# Patient Record
Sex: Female | Born: 1945
Health system: Southern US, Community
[De-identification: ages and names within clinical notes are randomized; demographics above are authoritative.]

## PROBLEM LIST (undated history)

## (undated) DIAGNOSIS — M629 Disorder of muscle, unspecified: Secondary | ICD-10-CM

## (undated) DIAGNOSIS — K270 Acute peptic ulcer, site unspecified, with hemorrhage: Secondary | ICD-10-CM

## (undated) DIAGNOSIS — M159 Polyosteoarthritis, unspecified: Secondary | ICD-10-CM

## (undated) DIAGNOSIS — I671 Cerebral aneurysm, nonruptured: Secondary | ICD-10-CM

## (undated) DIAGNOSIS — E785 Hyperlipidemia, unspecified: Secondary | ICD-10-CM

## (undated) DIAGNOSIS — I10 Essential (primary) hypertension: Secondary | ICD-10-CM

## (undated) DIAGNOSIS — E079 Disorder of thyroid, unspecified: Secondary | ICD-10-CM

## (undated) HISTORY — PX: ABDOMINAL HYSTERECTOMY: SHX81

## (undated) HISTORY — DX: Essential (primary) hypertension: I10

## (undated) HISTORY — PX: FOOT SURGERY: SHX648

## (undated) HISTORY — DX: Hyperlipidemia, unspecified: E78.5

## (undated) HISTORY — DX: Disorder of thyroid, unspecified: E07.9

## (undated) HISTORY — PX: KIDNEY STONE SURGERY: SHX686

## (undated) HISTORY — DX: Cerebral aneurysm, nonruptured: I67.1

## (undated) HISTORY — PX: BRAIN SURGERY: SHX531

## (undated) HISTORY — DX: Polyosteoarthritis, unspecified: M15.9

## (undated) HISTORY — PX: REFERRAL TO SPORTS AND SPINE: 9506

## (undated) HISTORY — DX: Acute peptic ulcer, site unspecified, with hemorrhage: K27.0

## (undated) HISTORY — PX: PR UNLISTED PROCEDURE NECK/THORAX: 21899

## (undated) HISTORY — DX: Disorder of muscle, unspecified: M62.9

## (undated) HISTORY — PX: PR UNLISTED PROCEDURE SPINE: 22899

---

## 2000-03-04 ENCOUNTER — Encounter: Payer: Self-pay | Admitting: Surgery

## 2001-06-02 ENCOUNTER — Encounter: Payer: Self-pay | Admitting: Urology

## 2001-07-07 ENCOUNTER — Encounter: Payer: No Typology Code available for payment source | Admitting: Surgery

## 2001-07-14 ENCOUNTER — Encounter: Payer: Self-pay | Admitting: Urology

## 2001-09-21 ENCOUNTER — Other Ambulatory Visit (HOSPITAL_BASED_OUTPATIENT_CLINIC_OR_DEPARTMENT_OTHER): Payer: Self-pay | Admitting: Surgery

## 2001-09-24 LAB — PATHOLOGY, SURGICAL

## 2001-09-29 ENCOUNTER — Encounter: Payer: No Typology Code available for payment source | Admitting: Surgery

## 2002-09-03 ENCOUNTER — Encounter: Payer: No Typology Code available for payment source | Admitting: Neurological Surgery

## 2002-10-01 ENCOUNTER — Encounter: Payer: No Typology Code available for payment source | Admitting: Physical Medicine & Rehabilitation

## 2002-10-05 ENCOUNTER — Encounter: Payer: No Typology Code available for payment source | Admitting: Orthopaedic Surgery

## 2002-10-12 ENCOUNTER — Encounter: Payer: No Typology Code available for payment source | Admitting: Ophthalmology

## 2003-01-11 ENCOUNTER — Encounter: Payer: No Typology Code available for payment source | Admitting: Orthopaedic Surgery

## 2003-01-13 ENCOUNTER — Encounter: Payer: No Typology Code available for payment source | Admitting: Internal Medicine

## 2003-01-19 ENCOUNTER — Other Ambulatory Visit: Payer: No Typology Code available for payment source

## 2003-01-26 ENCOUNTER — Encounter (HOSPITAL_COMMUNITY): Payer: No Typology Code available for payment source

## 2003-01-28 ENCOUNTER — Ambulatory Visit: Payer: No Typology Code available for payment source

## 2003-04-19 ENCOUNTER — Encounter: Payer: No Typology Code available for payment source | Admitting: Orthopaedic Surgery

## 2004-02-28 ENCOUNTER — Encounter: Payer: No Typology Code available for payment source | Admitting: Orthopaedic Surgery

## 2007-06-18 ENCOUNTER — Ambulatory Visit: Payer: Self-pay | Admitting: Gastroenterology

## 2007-06-18 ENCOUNTER — Ambulatory Visit (HOSPITAL_COMMUNITY): Admission: RE | Admit: 2007-06-18 | Discharge: 2007-06-18 | Payer: Self-pay | Admitting: Gastroenterology

## 2009-11-07 ENCOUNTER — Ambulatory Visit: Payer: No Typology Code available for payment source | Attending: Orthopaedic Surgery | Admitting: Orthopaedic Surgery

## 2009-11-07 VITALS — BP 130/80 | Ht 62.0 in | Wt 168.6 lb

## 2009-11-07 DIAGNOSIS — M545 Low back pain, unspecified: Secondary | ICD-10-CM | POA: Insufficient documentation

## 2009-11-07 DIAGNOSIS — M542 Cervicalgia: Secondary | ICD-10-CM | POA: Insufficient documentation

## 2009-11-07 DIAGNOSIS — Z981 Arthrodesis status: Secondary | ICD-10-CM | POA: Insufficient documentation

## 2009-11-07 LAB — PR RADEX SPINE LUMBOSACRAL 2/3 VIEWS

## 2009-11-07 LAB — PR RADEX SPINE CERVICAL 2 OR 3 VIEWS

## 2009-11-13 ENCOUNTER — Encounter (HOSPITAL_BASED_OUTPATIENT_CLINIC_OR_DEPARTMENT_OTHER): Payer: Self-pay | Admitting: Physical Medicine & Rehabilitation

## 2009-11-13 ENCOUNTER — Other Ambulatory Visit (HOSPITAL_BASED_OUTPATIENT_CLINIC_OR_DEPARTMENT_OTHER): Payer: Self-pay | Admitting: Physical Medicine & Rehabilitation

## 2009-11-13 NOTE — Progress Notes (Signed)
Referral from Dr Sibyl Parr for [facility].   MRI L spine report 10/16/2009 reviewed.   Order placed for ESI.   Pt needs to hand-carry MRI images to injection appt.   Will ask RN to contact pt for pre-injection teaching.

## 2009-11-14 NOTE — Progress Notes (Signed)
Danielle Small, Danielle Small [id number]  Orthopedics - Outpt Record Transcribed  Service Date: Nov-08-2009  Dictated by Stratton Villwock, MD, Ayson Cherubini J on Nov-08-2009  [id number]    ORTHOPEDICS RETURN CLINIC DICTATION NOTE   HISTORY OF PRESENT ILLNESS   The patient is a 63-year-old female, who is all known to our clinic, although we have not seen her in a few years. She in short had a L4-S1 fusion. She actually had 4 previous lumbar surgeries, none that we performed at the Orthopedic department; however, most recent one was done by Dr Shaffrey, who did a revision L4-L5 and S1 fusion. He did place anterior grafts well. She has done extremely well from this and has really had no problems until about 6 months ago. She had a rotator cuff procedure and after that, she felt like being sedentary in every thing and elicited some significant symptoms in her bilateral buttock and anterolateral legs down to the level of her knee but not below. She denies any incontinence or frank weakness, but she is having this thing that is rather consistent and bothersome to her.   Of note, she did have 6 corpectomy and C5-C7 ACDF with SynMesh cage placed by Dr Chapman about 6 years ago. She has done very well from this. She has had no complaints and her symptoms have resolved. It appears that the majority of her symptoms preoperative from that were symptoms of radiculopathy more so than myelopathy. She does claim upon being asked that she may notice a little bit more instability on her feet, although this is quite subtle and has not resulted in any falls. She denies any clumsiness in her hands or weakness of her upper extremities.   She does come in today with an MRI of her lumbar spine, which is performed about 3 weeks ago.   PHYSICAL EXAMINATION   NEUROLOGIC: Bilateral upper extremities demonstrate 5/5 motor. She does have positive Hoffman bilaterally. She has 2+ biceps, triceps, reflexes 1-1+ in brachioradialis. Bilateral lower extremities demonstrate 5/5 motor.  Her right knee extension is somewhat limited by her knee osteoarthritic pain and is difficult to get an exam, although this appears to be 5/5. Her tendo Achilles and patellar reflexes are 2+ on bilateral lower extremities and her left lower extremity has 1 beat of clonus. On ambulatory exam, she is stable, has a normal gait. She is able to heel walk, toe walk. She has slight difficulty with tandem and heel-to-toe walking, but is able to do it when she walks slowly.   IMAGING   Lumbar films were performed today, which showed the hardware to be in good position and good consolidation across her L4-L5 interspace and L5-S1 interspace. She does show interval breakdown of the L3-L4 level since her previous x-rays, which were about 3 years ago. She has slight anterolisthesis of L3 on L4 and decreased disk height as compared to her last x-ray. Her cervical x-rays were reviewed as well, which showed the previous mesh cage in place. In the interim since her last x-rays approximately 3 years ago, it appears that she has broken one of her upper screws and the plate may have shifted anteriorly very minimally. This does not appear to be significantly displaced. She has shown some adjacent level of degeneration at the level above of her previous fusion and slight increase in kyphosis on her flexion films, although there is no gross instability noted.   IMPRESSION   This 63-year-old female with adjacent level breakdown of L3-L4 above her L4-S1 fusion with bilateral   lower extremity symptoms in her buttocks and lateral thighs.   PLAN   1.   She would like to hold off on any thing surgical at this point. She is very active and is currently swimming and exercising quite a bit. She was interested in trying an injection. We will get her set up for a steroid injection.    2.   As far as her cervical spine is concerned, we are going to keep an eye on this. I do not think she is frankly myelopathic at this point. We are going to keep a  close eye on it and she has been given any warning signs to make us aware of, which would be consistent with myelopathy. We will get return x-rays of her cervical spine AP, lateral, flexion and extension films, and the same for her lumbar spine when she returns in 6 months. In the interim, she will have lumbar epidural steroid injection.          Signature Line   _______________________________________  Almeta Geisel, MD, Analycia Khokhar J  Attending, Department of Orthopaedics  [id number]  [place], [place]                cc: Franceschina, DO, Michael J  [address]  [place], [place] [id number]    Adatia, DO, Alnasir H  [address] [place] [address]        JJM/TUO  DD:11/07/09  TD:11/14/09      [id number]    CC Address Information  Franceschina, DO, Michael J  [address]  [place], [place] [id number]

## 2009-11-14 NOTE — Progress Notes (Signed)
I concur with note of Dr. Lady Saucier as listed below. I have personally examined the patient and discussed findings and treatment options with her and her husband.

## 2009-11-17 ENCOUNTER — Telehealth (HOSPITAL_BASED_OUTPATIENT_CLINIC_OR_DEPARTMENT_OTHER): Payer: Self-pay

## 2009-11-20 NOTE — Telephone Encounter (Signed)
Date of Call: 11/17/09    Reason for Call:  Pre-injection instructions.  Pre-injection instructions as follows:  Procedure to be performed:  Left L3 TFESI  Date of Procedure:  11/21/09  Provider Performing Procedure: Carlos American  Location of Procedure: [facility]  **Must have ride home after procedure: instructed (rationale explained to patient)  MRI in [facility]: 11/07/09   If MRI performed outside [facility] system, patient to bring MRI to appointment:     Allergy to Contrast Media:  denies  Allergies Reviewed by Provider: yes    IV/Sedation: no  Take medications prescribed by PCP on day of procedure: yes  Light breakfast or lunch before procedure. NPO 1 hr. before procedure: yes  Restart Medications Day After Procedure: yes    Patient Advised To Discontinue:  Coumadin- stop for 5 days pre-injection and 2 days post-injection: NA  Plavix- stop for 7 days:  NA  NSAIDs- stop for 2 days: instructed  ASAs- stop for 5 days: instructed  Low-dose ASA (baby ASA) may be continued: yes    Insulin:   Patient takes metformin but does not do  blood testing.  Instructed patient to test her blood sugar X2 days post injection because of possible effect of steroid on her blood sugars.  Patient states she will do so and indicates understanding of why she needs to do this.  Hypoglycemics: Metformin  Pre-procedure INR needed: NA    Instructed to call office if signs of infection or taking antibiotics: yes  Call [facility] with Additional Laymond Purser [phone]  Day of Procedure- call [facility] @ [phone]    Roselee Culver RN  [facility]

## 2009-11-21 ENCOUNTER — Ambulatory Visit (HOSPITAL_BASED_OUTPATIENT_CLINIC_OR_DEPARTMENT_OTHER): Payer: No Typology Code available for payment source | Admitting: Physical Medicine & Rehabilitation

## 2009-11-28 NOTE — Progress Notes (Addendum)
Danielle Small, Danielle Small [id number]  Orthopedics - Outpt Record Transcribed  Service Date: Nov-08-2009  Dictated by Lady Saucier, MD, Eustace Moore on Nov-08-2009  [id number]    ORTHOPEDICS RETURN CLINIC DICTATION NOTE   HISTORY OF PRESENT ILLNESS   The patient is a 63 year old female, who is all known to our clinic, although we have not seen her in a few years. She in short had a L4-S1 fusion. She actually had 4 previous lumbar surgeries, none that we performed at the Orthopedic department; however, most recent one was done by Dr Corbin Ade, who did a revision L4-L5 and S1 fusion. He did place anterior grafts well. She has done extremely well from this and has really had no problems until about 6 months ago. She had a rotator cuff procedure and after that, she felt like being sedentary in every thing and elicited some significant symptoms in her bilateral buttock and anterolateral legs down to the level of her knee but not below. She denies any incontinence or frank weakness, but she is having this thing that is rather consistent and bothersome to her.   Of note, she did have 6 corpectomy and C5-C7 ACDF with SynMesh cage placed by Dr Sibyl Parr about 6 years ago. She has done very well from this. She has had no complaints and her symptoms have resolved. It appears that the majority of her symptoms preoperative from that were symptoms of radiculopathy more so than myelopathy. She does claim upon being asked that she may notice a little bit more instability on her feet, although this is quite subtle and has not resulted in any falls. She denies any clumsiness in her hands or weakness of her upper extremities.   She does come in today with an MRI of her lumbar spine, which is performed about 3 weeks ago.   PHYSICAL EXAMINATION   NEUROLOGIC: Bilateral upper extremities demonstrate 5/5 motor. She does have positive Hoffman bilaterally. She has 2+ biceps, triceps, reflexes 1-1+ in brachioradialis. Bilateral lower extremities demonstrate 5/5 motor.  Her right knee extension is somewhat limited by her knee osteoarthritic pain and is difficult to get an exam, although this appears to be 5/5. Her tendo Achilles and patellar reflexes are 2+ on bilateral lower extremities and her left lower extremity has 1 beat of clonus. On ambulatory exam, she is stable, has a normal gait. She is able to heel walk, toe walk. She has slight difficulty with tandem and heel-to-toe walking, but is able to do it when she walks slowly.   IMAGING   Lumbar films were performed today, which showed the hardware to be in good position and good consolidation across her L4-L5 interspace and L5-S1 interspace. She does show interval breakdown of the L3-L4 level since her previous x-rays, which were about 3 years ago. She has slight anterolisthesis of L3 on L4 and decreased disk height as compared to her last x-ray. Her cervical x-rays were reviewed as well, which showed the previous mesh cage in place. In the interim since her last x-rays approximately 3 years ago, it appears that she has broken one of her upper screws and the plate may have shifted anteriorly very minimally. This does not appear to be significantly displaced. She has shown some adjacent level of degeneration at the level above of her previous fusion and slight increase in kyphosis on her flexion films, although there is no gross instability noted.   IMPRESSION   This 63 year old female with adjacent level breakdown of L3-L4 above her L4-S1 fusion with bilateral  lower extremity symptoms in her buttocks and lateral thighs.   PLAN   1.   She would like to hold off on any thing surgical at this point. She is very active and is currently swimming and exercising quite a bit. She was interested in trying an injection. We will get her set up for a steroid injection.    2.   As far as her cervical spine is concerned, we are going to keep an eye on this. I do not think she is frankly myelopathic at this point. We are going to keep a  close eye on it and she has been given any warning signs to make [place] aware of, which would be consistent with myelopathy. We will get return x-rays of her cervical spine AP, lateral, flexion and extension films, and the same for her lumbar spine when she returns in 6 months. In the interim, she will have lumbar epidural steroid injection.          Signature Line   _______________________________________  Lady Saucier, MD, Eustace Moore  Attending, Department of Orthopaedics  [phone]  [place], [place]                cc: Ileene Musa  [address]  [place], [place] [id number]    Anthony Sar, Marney Setting  [address] [address] [place], [place] [address]        JJM/TUO  DD:11/07/09  TD:11/14/09      [id number]    CC Address Information  Ileene Musa  [address] [address]  [place], [place] [id number]

## 2009-12-11 ENCOUNTER — Telehealth (HOSPITAL_BASED_OUTPATIENT_CLINIC_OR_DEPARTMENT_OTHER): Payer: Self-pay | Admitting: Physical Medicine & Rehabilitation

## 2009-12-11 NOTE — Telephone Encounter (Signed)
VL 12.13:  The SSP RN is out sick today, so in preparation for the RN Float, I double-checked the patient's INJ paperwork including INJ order, patient allergies, and check-in / INJ times.  Please note:  Patient is allergic to penicillin V.  Will have the RN Float follow-up with INJ patients later today for any pre- and post-INJ protocls.  Thanks.    Jill Side, POS  [facility]

## 2009-12-11 NOTE — Telephone Encounter (Signed)
Spoke with Danielle Small by phone this afternoon, verbally gave patient pre and post op instructions for left  L3TFESI procedure tomorrow at 1000.   Instructed to take am chem stick and bring result to procedure, per Dr Julianne Rice) will take usual dose of Metformin, have light breakfast,NPO after 0900,check in at [facility] at 0930.  Confirmed only allergy is Penicillin.  Confirmed she will bring recent MRI  Daughter will provide ride home.  For urgent and emergent issues after clinic hours, after procedure to contact MD on call for Dr Bayard Beaver at [phone].  She stated has had this procedure before.  All questions answered.  Austyn verbalized understanding and comfortable with plan.  [employer] inspector

## 2009-12-12 ENCOUNTER — Ambulatory Visit (HOSPITAL_BASED_OUTPATIENT_CLINIC_OR_DEPARTMENT_OTHER): Payer: No Typology Code available for payment source | Admitting: Physical Medicine & Rehabilitation

## 2009-12-12 ENCOUNTER — Ambulatory Visit
Payer: No Typology Code available for payment source | Attending: Physical Medicine & Rehabilitation | Admitting: Physical Medicine & Rehabilitation

## 2009-12-12 DIAGNOSIS — M5126 Other intervertebral disc displacement, lumbar region: Secondary | ICD-10-CM | POA: Insufficient documentation

## 2009-12-12 DIAGNOSIS — IMO0002 Reserved for concepts with insufficient information to code with codable children: Secondary | ICD-10-CM | POA: Insufficient documentation

## 2009-12-12 DIAGNOSIS — M47817 Spondylosis without myelopathy or radiculopathy, lumbosacral region: Secondary | ICD-10-CM | POA: Insufficient documentation

## 2009-12-12 NOTE — Progress Notes (Addendum)
[address]  l  [id number]  l  [place], [place]  [address]  TEL: [phone]  l  FAX: [phone]    Specializing in sports-related injuries and non-surgical care for conditions of the spine,  shoulder, elbow, wrist, hand, hip, knee, foot, and ankle.      12/12/2009    Orrin Brigham Alverda Skeans  [id number]      PROCEDURE:  Fluoroscopic guided L L3 transforaminal epidural steroid injection    DIAGNOSIS/INDICATION: L>R radicular pain in anterior thigh.  MRI from 10/16/2009 reviewed. S/p L4-S1 fusion, HNP, central and L lateral recess stenosis L3-4. Referred by Dr Sibyl Parr for [facility]. Please see clinic notes for additional information.  Allergies: is allergic to penicillin v.  Allergies were reviewed with the patient. No contraindications were identified.      CONSENT:    Potential risks including pain, serious infection, paralysis, nerve injury, spinal headache, allergic reaction, and others were discussed with the patient. The patient was given and read a patient information sheet regarding injection procedures prior to today's injection. The patient understands the benefits, risks, and alternatives of the procedure and agrees to the procedure today.  Written informed consent was obtained.       PROCEDURE TECHNIQUE:    The patient was escorted to the procedure suite and positioned prone on the procedure table. A brief pause occurred prior to procedure in which final verification was performed. The skin overlying the area was prepped and draped in the usual sterile fashion using Betadine.  Under fluoroscopic guidance the left L3 neuroforamen was located. Using a 25-gauge needle, the skin overlying the area was anesthetized with 1% lidocaine. Using intermittent fluoroscopic guidance, a 22 gauge 3  inch spinal needle was advanced to the region of the target nerve root.  A small amount of Omnipaque (iohexol) contrast was instilled through extension tubing to identify the nerve root sleeve and assure no  vascular tracking using live fluoroscopy and digital subtraction angiography. This was followed by the injection of 1.5 cc 1.0% lidocaine and 2 cc of Kenalog. The needle was then flushed with 1% lidocaine and removed, the skin was cleansed, and a bandage placed over the injection site.  The patient was assisted to the seated position and then escorted to the post procedure recovery area in good condition.  The patient tolerated the procedure well.  Vital signs remained stable both pre- and post- procedure.   Complications: none    Pre-inj 7/10  Post-inj 2/10    POST-INJECTION PLAN  1.  The patient was given instructions regarding the use of ice and appropriate activity level over the next few weeks.  Patient was instructed to call my office immediately if there are any questions or problems.    2.  The patient will follow up with Dr Sibyl Parr in 2-3 weeks.

## 2009-12-15 ENCOUNTER — Telehealth (HOSPITAL_BASED_OUTPATIENT_CLINIC_OR_DEPARTMENT_OTHER): Payer: Self-pay

## 2009-12-15 NOTE — Telephone Encounter (Signed)
Follow-up phone call regarding Left L3 joint steroid injection done 12/12/09 by Dr Carlos American. Pt states that she is "doing very well thank you and on the whole feeling terrific". She denies any pain, redness at injection site. She denied any new symptoms such as numbness, tingling, weakness, or changes in bowel or bladder function. Follow-up was scheduled for 12/19/09 with Dr Sibyl Parr and she will call the clinic in the meantime with any questions or concerns.

## 2009-12-19 ENCOUNTER — Encounter (HOSPITAL_BASED_OUTPATIENT_CLINIC_OR_DEPARTMENT_OTHER): Payer: Self-pay | Admitting: Orthopaedic Surgery

## 2009-12-19 ENCOUNTER — Ambulatory Visit: Payer: No Typology Code available for payment source | Attending: Orthopaedic Surgery | Admitting: Orthopaedic Surgery

## 2009-12-19 DIAGNOSIS — IMO0002 Reserved for concepts with insufficient information to code with codable children: Secondary | ICD-10-CM | POA: Insufficient documentation

## 2009-12-19 DIAGNOSIS — M4310 Spondylolisthesis, site unspecified: Secondary | ICD-10-CM | POA: Insufficient documentation

## 2009-12-19 NOTE — Progress Notes (Signed)
HPI: Danielle Small is a 63 year old female who was last seen 11/07/09 w/ complaint of significant bilateral posterior buttock and anterolateral leg pain to the knee level without frank weakness or incontinence after a rotator cuff surgery and being sedentary postoperatively ~6 months ago.      She is s/p L4-S1 fusion w/ 4 previous lumbar surgeries, none that we performed at the [facility] Orthopedic department; however, most recent one was done by Dr Corbin Ade, who did a revision L4-L5 and S1 fusion. He did place anterior grafts well. She has done extremely well from this and has really had no problems until ~ 6 months ago as described above.    On her last visit she was found to have well healed fusion sites with hardware in place.  However, there was noted interval breakdown of the L3-4 level above the fusion site with decreased disk height and grade 1 anterolisthesis of L3-L4.  She was referred to Dr. Bayard Beaver of the [facility] sports and spine service for Left L3 TFESI.     The patient returns for follow up after epidural steroid injection done 12/12/09. She reportsher symptoms are absent .  She reports that she had immediate relief of her anterolateral thigh symptoms that have not returned.  She has continued to be active with Swimming, walkinga dn extended shopping without return of her symptoms.          Social History   Occupational History   . Not on file.   Social History Main Topics   . Tobacco Use: Never   . Alcohol Use: Not on file   . Drug Use: Not on file   . Sexually Active: Not on file         Allergies: Penicillin v    She has a current medication list which includes butalbital-asa-caff-codeine.    ROS: unchanged.  She specifically denies fever, chills, n/v, heart condition, breathing problems, or diabetes.    Physical exam:    GENERAL:  Danielle Small is a 63 year old obese female in no apparent distress.    PSYCHIATRIC: The patient is alert and oriented. Calm, pleasant  affect.    PULMONARY: The patient has Nonlabored breathing    Spine Exam:     Exam is unchanged with Bilateral lower extremities 5/5 to all major motor groups. Continues to have rifht knee limited extension d/t osteoarthritis.DTR Patellar and achilles are 2+ bilaterally.  She has a stable gait that is nonantalgic.  She is able to heal and toes walk without difficulty.      Imaging:  No imaging done today.      Assessment:    Danielle Small Danielle Small is a 63 year old female  with the clinical presentation of complete relief of left sided L3 radicular symptoms 1 week following L L3 TFESI.  - Encouraged that the patient has gotten such a good result.   - Advised to continue low impact aerobic activity with strong core and limber and strong hamstrings, quads and gluteals.  Pt. Has been instructed in flamingo stance adn hamsteing stretches adn actually is fairly naturally limber in hamstrings.  She is advised to do these stretches 4 times daily for 30 seconds each.  Seh is also advised to continue swimming 3-4 times weekly and work on trunk weight loss.  - No surgical indication at this time.   Would avoid extending fusion to L3-4 level unless worsening of degenerative changes.  - Advised may  have up to 3 ESI yearly.  -F/u with Dr. Sibyl Parr prn.    I have spent at least 15 minutes with the patient of which at least half the time was dedicated to counseling and discussion of treatment options.

## 2010-03-09 ENCOUNTER — Telehealth (HOSPITAL_BASED_OUTPATIENT_CLINIC_OR_DEPARTMENT_OTHER): Payer: Self-pay

## 2010-03-09 NOTE — Telephone Encounter (Signed)
Message copied by Andrey Cota on Fri Mar 09, 2010 9:23 AM  ------   Message from: Derrill Memo   Created: Fri Mar 09, 2010 8:27 AM   Contact: chapman     Pt is having extreme pain lower back with shooting pain down left leg. Pain level 8+ Please give her a call. [phone]

## 2010-03-09 NOTE — Telephone Encounter (Signed)
Returned Pts call, no answer left VM to return my call.

## 2010-03-13 NOTE — Telephone Encounter (Signed)
Pt is 64 yo female last seen 12/19/09 s/p L4-S1 fusion w/ 4 previous lumbar surgeries, none that we performed at the [facility] Orthopedic department; however, most recent one was done by Dr Corbin Ade, who did a revision L4-L5 and S1 fusion.   On her last visit 11/07/09 she was found to have well healed fusion sites with hardware in place. However, there was noted interval breakdown of the L3-4 level above the fusion site with decreased disk height and grade 1 anterolisthesis of L3-L4. She was referred to Dr. Bayard Beaver of the [facility] sports and spine service for Left L3 TFESI done 12/12/09.  Pt has been appointed with Dwaine Deter PA-C 03/27/09.  I called and spoke to pt who reports she got about 2 3/4 months of pain relief after injection, that pt came back about 3 weeks ago the same as before the injection low back and left leg, pt states she is taking about 1-2 Tylenol #3 per day to get through work and occasionally a Herbalist at night with fair relief, but pt would like a repeat injection, pt also asks about referral to Pain Clinic for ongoing pain management as her PCP is becoming reluctant to continue to prescribe.  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

## 2010-03-15 ENCOUNTER — Other Ambulatory Visit (HOSPITAL_BASED_OUTPATIENT_CLINIC_OR_DEPARTMENT_OTHER): Payer: Self-pay | Admitting: Physical Medicine & Rehabilitation

## 2010-03-15 NOTE — Progress Notes (Signed)
Referred by Dr Sibyl Parr for [facility]. Good relief with prior L L3 TFESI.   MRI reviewed.   Order completed.   Will ask RN to contact pt for pre-procedure teaching.   Pt has appt with Dwaine Deter, Ortho-Spine 3/29, and per notes would like to have injection in April.   Pt to f/u with Dr Esperanza Sheets team 2-3 weeks after injection.

## 2010-03-16 NOTE — Telephone Encounter (Signed)
I called and left message letting pt know that referral to Sports and Spine for repeat ESI has been entered, also that referral to Pain Clinic should be discussed at visit with Dwaine Deter PA-C.

## 2010-03-19 ENCOUNTER — Telehealth (HOSPITAL_BASED_OUTPATIENT_CLINIC_OR_DEPARTMENT_OTHER): Payer: Self-pay | Admitting: Physician Assistant

## 2010-03-19 NOTE — Telephone Encounter (Signed)
Pt. To see Dr. Carlos American for [facility] and f/u w/ PA 2 weeks later.  Discussed w/ Pt. Her concerns that PCP is becoming more reluctant to prescribe narcotic pain medication on an ongoing basis.  At this point we will not be referring to pain clinic, but advise to get injection as scheduled 04/10/10, then f/u in our clinic to evaluate theraputic as well as diagnostic result of the injection and decide treatment path forward at that time.

## 2010-03-27 ENCOUNTER — Encounter (HOSPITAL_BASED_OUTPATIENT_CLINIC_OR_DEPARTMENT_OTHER): Payer: No Typology Code available for payment source | Admitting: Physician Assistant

## 2010-04-09 ENCOUNTER — Telehealth (HOSPITAL_BASED_OUTPATIENT_CLINIC_OR_DEPARTMENT_OTHER): Payer: Self-pay

## 2010-04-09 NOTE — Telephone Encounter (Signed)
SPORTS & SPINE  INJECTION INSTRUCTIONS      PATIENT: Danielle Small    [id number]  Date of Procedure:  04/10/10  Provider performing procedure:  Bayard Beaver, MD  Patient to follow-up with:  Dr. Sibyl Parr  Procedure to be performed (including side):  Left l3 tfesi   Diagnostic Procedure Only:  NO  REQUIRED IMAGING IN PACS:  yes   Patient reminded to bring MRI to appointment if completed outside the [place] system:  no    PRE-INJ GENERAL INSTRUCTIONS:    Patient instructed to check in at the [facility], located NEXT DOOR on the 4th floor at the [facility] ([address] [place], [place], [place] [address]):  yes  Patient told to eat a light breakfast/lunch, but NPO 1 hr prior to procedure:  yes  Patient instructed on ride home:  yes  Patient instructed to call the [facility] @ [phone] and speak to the nurse:   If experincing any cold symptoms such fevers, chills, night sweating or coughing:  yes  If taking any antibiotics or being treated for an infection:  yes  If she has any additional questions BEFORE the INJ: yes  For day of the procedure, patient instructed to call the [facility] @ [phone]:  yes   Patient advised to take all medications as usually taken throughout the day (including blood pressure medications and diabetes medications), with the exception of any blood-thinning medications.  PATIENT ADVISED TO DISCONTINUE ANTI-COAG MEDS (**Please see List Below**):  N/A (Patient not taking medication(s)) of note that would reschedule procedure.    **ANTI-COAG/MED GUIDELINES TO REVIEW WITH PATIENT BEFORE INJECTION PROCEDURES**  AXIAL INJECTIONS:   NSAIDs:  do not need to hold  ASA:   < 300mg  ~ do not need to hold  >/= 300mg  ~ hold for 7days   Clopidrogel (Plavix) ~ hold for 7days  Ticlopidine (Ticlid) ~ hold for 14days  Warfarin (Coumadin) ~ hold for 5days, check rapid INR on that day,    OK to proceed if INR </= 1.5   Aggrenox (ASA and Dipyridamole) ~ hold  7days  Dipyridamole (Persantine) ~ hold 7days  Tirofiban (Aggrastat) ~ hold 2days  Abciximab (ReoPro) ~ hold 2days  Eptifibatide (Integrilin) ~ hold 2days  Pletal and Trental  ~ hold for 2days  Orgaran ~ hold for 5days  Heparin, Lovenox, Innohep, Fragmin, Normiflo ~ hold for 24 hours  Vitamin E (greater than 400 IU daily) ~ hold for 7days  All herbals ~ hold for 7days  INTRA-ARTICULAR HIP/SHOULDER/KNEE, BURSAL/TENDON INJECTIONS:  Per discretion of provider   **OK to restart all medications evening following procedure.    ALL MEDICATION ALLERGIES REVIEWED BY:  Feliz Beam, RN on 04/09/2010    ADDITIONAL INFORMATION (If "YES" to any questions, notify RN or MD immediately):   Contrast / Iodine / Shellfish Allergy?  NO  Pre-Medication Ordered:  NO  IV: NO   Pre-Procedure STAT Rapid INR Needed?  NO  STAT Rapid INR Ordered:  NO   Patient is diabetic?  No  Patient is/may be pregnant?  No    POST-INJ GENERAL INSTRUCTIONS:    PATIENT INSTRUCTED TO ICE THE INJ SITE 10-15 minutes approximately 3-4 times a day on the day of the INJ, and 10-15 minutes 3-4 times on the day following the INJ to assist with local discomfort:  yes     PATIENT INSTRUCTED TO CALL THE [facility] CLINIC immediately if she develops severe pain, redness,  warmth, or swelling around the INJ site, shaking, chills, or fevers greater than 100F.:  yes     PATIENT INSTRUCTED THAT ACTIVITY SHOULD BE LIMITED on the day of the INJ and physical therapy and other exercises should be delayed for three (3) days following the INJ.  However, return to usual daily activities is expected the day after the INJ.  Typically, if the patient is working, she can expect to return to work the day after the INJ:   yes     IF PATIENT NEEDS ADDITIONAL MEDICATION, if required, it should be obtained from her referring physician:  yes     PATIENT INSTRUCTED THAT IF she HAS ANY CONCERNS, INCLUDING BUT NOT LIMITED TO, worsening pain, numbness, leg weakness or significant  bladder/bowel changes, please call our office immediately at [phone] and ask to speak to our Nurse:  yes     PATIENT TO FOLLOW-UP instructed:  yes      All of the patient's questions were answered.    Feliz Beam, RN  [facility]

## 2010-04-10 ENCOUNTER — Ambulatory Visit (HOSPITAL_BASED_OUTPATIENT_CLINIC_OR_DEPARTMENT_OTHER): Payer: No Typology Code available for payment source | Admitting: Physical Medicine & Rehabilitation

## 2010-04-10 ENCOUNTER — Ambulatory Visit
Payer: No Typology Code available for payment source | Attending: Physical Medicine & Rehabilitation | Admitting: Physical Medicine & Rehabilitation

## 2010-04-10 DIAGNOSIS — IMO0002 Reserved for concepts with insufficient information to code with codable children: Secondary | ICD-10-CM | POA: Insufficient documentation

## 2010-04-10 DIAGNOSIS — M47817 Spondylosis without myelopathy or radiculopathy, lumbosacral region: Secondary | ICD-10-CM | POA: Insufficient documentation

## 2010-04-10 NOTE — Progress Notes (Signed)
[facility]       [address]  [address]  [id number]  [place], [place]  [address]  TEL: [phone]  l  FAX: [phone]    Specializing in sports-related injuries and non-surgical care for conditions of the spine,  shoulder, elbow, wrist, hand, hip, knee, foot, and ankle.      04/10/2010    Orrin Brigham Alverda Skeans  [id number]      PROCEDURE:  Fluoroscopic guided left L3 transforaminal epidural steroid injection    DIAGNOSIS/INDICATION: left L3 radicular pain s/p L4-S1 fusion w/ 4 previous lumbar surgeries. Underwent previous L L3 TFESI w/ good relief. Referred by Dr Sibyl Parr for repeat injection.  Please see clinic notes for additional information.  Allergies: is allergic to penicillin v.  Allergies were reviewed with the patient. No contraindications were identified.      CONSENT:    Potential risks including pain, serious infection, paralysis, nerve injury, spinal headache, allergic reaction, and others were discussed with the patient. The patient was given and read a patient information sheet regarding injection procedures prior to today's injection. The patient understands the benefits, risks, and alternatives of the procedure and agrees to the procedure today.  Written informed consent was obtained.       PROCEDURE TECHNIQUE:    The patient was escorted to the procedure suite and positioned prone on the procedure table. A brief pause occurred prior to procedure in which final verification was performed. The skin overlying the area was prepped and draped in the usual sterile fashion using Betadine.  Under fluoroscopic guidance the left L3 neuroforamen was located. Using a 25-gauge needle, the skin overlying the area was anesthetized with 1% lidocaine. Using intermittent fluoroscopic guidance, a 22 gauge 3  inch spinal needle was advanced to the region of the target nerve root.  A small amount of Omnipaque (iohexol) contrast was instilled through extension tubing to identify the  nerve root sleeve and assure no vascular tracking using live fluoroscopy and digital subtraction angiography. This was followed by the injection of 1.5 cc 1.0% lidocaine and 2 cc of Kenalog. The needle was then flushed with 1% lidocaine and removed, the skin was cleansed, and a bandage placed over the injection site.  The patient was assisted to the seated position and then escorted to the post procedure recovery area in good condition.  The patient tolerated the procedure well.  Vital signs remained stable both pre- and post- procedure.   Complications: none    POST-INJECTION PLAN  1.  The patient was given instructions regarding the use of ice and appropriate activity level over the next few weeks.  Patient was instructed to call my office immediately if there are any questions or problems.    2.  The patient will follow up with Dr Sibyl Parr in 2-3 weeks.

## 2010-04-13 ENCOUNTER — Telehealth (HOSPITAL_BASED_OUTPATIENT_CLINIC_OR_DEPARTMENT_OTHER): Payer: Self-pay

## 2010-04-13 NOTE — Telephone Encounter (Signed)
I talked with this patient who had a Fluoroscopic guided left L3 transforaminal epidural steroid injection on 04/10/10. She is doing just fine, most of her discomfort has gone away. She will call if she has any concerns.     Blenda Peals, RN

## 2010-04-23 ENCOUNTER — Ambulatory Visit: Payer: No Typology Code available for payment source | Attending: Physician Assistant | Admitting: Physician Assistant

## 2010-04-23 ENCOUNTER — Encounter (HOSPITAL_BASED_OUTPATIENT_CLINIC_OR_DEPARTMENT_OTHER): Payer: Self-pay | Admitting: Physician Assistant

## 2010-04-23 VITALS — BP 129/78 | HR 80 | Temp 97.7°F

## 2010-04-23 DIAGNOSIS — M545 Low back pain, unspecified: Secondary | ICD-10-CM | POA: Insufficient documentation

## 2010-04-23 NOTE — Progress Notes (Signed)
HPI: Danielle Small is a 64 year old female who had been previously evaluated for axial back pain and lumbar radiculopathy.  The patient returns for follow up after TF ESI to left L3.  This is the second ESI tot his area performed by Dr. Bayard Beaver on 04/10/10.  The patient underwent an initial ESI to  the left L3 level 12/12/09 and reported excellent relief for approximately 3 months with gradual return of her left low back pain and radiating left ant. Thigh pain.  She reports her symptoms are much improved.  She rates her pain after th 04/10/10 injection as 2-3/10.  She is very pleased w/ the relief she has again gotten.  She had a setback in leg pain that she feels is unrelated. She slipped on a wet spot on her wood floor on Sunday 8 days ago, and landed directly on her right knee that she reports has known arthritis. She was evaluated by her general orthopaedist Dr. Estanislado Pandy who-per her report- cleared her of any fx dislocation or ligamnet damage and told her he expects the pain ans mild swelling to gradually resolve. He gave her a few T3s that she is now almost out of.      Past Surgical History   Procedure Date   . Referral to sports and spine        family history is not on file.  Social History   Occupational History   . Not on file.   Social History Main Topics   . Tobacco Use: Never   . Alcohol Use: No   . Drug Use: Not on file   . Sexually Active: Not on file       Allergies: Penicillin v    She has a current medication list which includes acetaminophen-codeine, vitamin c, atenolol, butalbital-asa-caff-codeine, calcium carbonate, calcium-magnesium-vitamin d, carisoprodol, chlorphen tan-phenyleph tan, cyclobenzaprine hcl, fenofibrate, iron, gabapentin, levocetirizine dihydrochloride, lisinopril, lovastatin, metformin hcl, milk thistle, multiple vitamin, omeprazole, potassium gluconate, sertraline hcl, and vitamin e.    ROS: unchanged    Physical exam:    GENERAL:  Danielle Small is a 64 year old female in no apparent distress. Well appearing    Filed Vitals:    04/23/2010  2:38 PM   BP: 129/78   Pulse: 80   Temp: 97.7 F (36.5 C)   TempSrc: Temporal   SpO2: 96%         PSYCHIATRIC: The patient is alert and oriented. Calm, pleasant affect.    PULMONARY: The patient has Nonlabored breathing      SKIN: right medial knee w/ resolving ecchymosis    Posture: erect    Spine Exam:     LUMBAR SPINE    VASCULAR: posterior tibialis and dorsalis pedis pulses are palpable bilaterally.  Capillary refill is < 3 seconds. No lower extremity edema.  MUSCULOSKELETAL:  The patient has pain with range of motion of the right knee, with slight fullness no significant edema.  Right knee ROM is WNL but painful.   NEUROLOGICAL:     SENSATION RIGHT LEFT   Back normal  normal    Anterior medial thigh (L1, L2, L3) normal  normal    Anterior lateral thigh/medial calf (L4) normal  normal    Lateral thigh, lateral calf (L5) normal  normal    Posterior thigh, calf (S1) normal  normal              MOTOR STRENGTH RIGHT LEFT   Hip  Flexion 5/5 5/5   Knee extension 5/5 5/5   Knee flexion  5/5 5/5   Dorsiflexion 5/5 5/5   EHL 5/5 5/5   Plantar flexion 5/5 5/5     REFLEXES     Knee  2+ 2+   Ankle  trace trace   Babinski absent absent   Clonus  0 beats 0 beats               Straight leg raise is negative on the right and left     Gait is mildly antalgic- favors right knee sightly     Pt is able to heel walk bilaterally.  Pt is able to toe walk bilaterally.  Pt is able to tandem walk bilaterally    BACK ROM: Able to flex with fingertips touching the floor anad extend the back beyond neutral without any increased back or leg pain.      Imaging: No imaging taken today       Assessment:    Danielle Small is a 64 year old female  with the clinical presentation of much improved low back pain and left radicular pain following second TF ESI left L3 04/10/10.  - we are encouraged that she has had significant  relief now from 2 ESIs.  Will continue a conservative treatment plan.  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  She also reports swimming laps weekly - ~ 3/4 of a mile.  - If pain returns and a 3rd ESI is needed will write order to [facility] w/ Dr. Carlos American.  - If right knee pain persists f/u w/ geeneral ortho as she has done previously.  - Advise f/u with Dr. Sibyl Parr in Aug or Sept- if doing well f/u in Nov. 2011.  I have spent at least 20 minutes with the patient of which at least half the time was dedicated to counseling and discussion of treatment options.

## 2010-05-18 ENCOUNTER — Telehealth (HOSPITAL_BASED_OUTPATIENT_CLINIC_OR_DEPARTMENT_OTHER): Payer: Self-pay

## 2010-05-18 NOTE — Telephone Encounter (Signed)
Message copied by Erik Obey on Fri May 18, 2010 9:41 AM  ------   Message from: Caesar Chestnut D   Created: Wed May 16, 2010 9:56 AM   Contact: Earls     Pt called wanting to speak with Fannie Knee. She wanted to see if there if she could be sent the information in regards to her back that Dr Sibyl Parr had given her (re: her diagnosis)     Please call her back at [phone]

## 2010-05-18 NOTE — Telephone Encounter (Signed)
is a 64 year old female w/ a hx of axial back pain and lumbar radiculopathy. Pt last RTC on 04/23/10 for follow up after TF ESI to left L3, 2nd ESI to this area performed by Dr. Bayard Beaver on 04/10/10. The patient underwent an initial ESI to the left L3 level 12/12/09 and reported excellent relief for approximately 3 months with gradual return of her left low back pain and radiating left ant.  At 4/25 visit, pt reported excellent sessation of symptoms. Pt reports still doing well, shot this time was a "little slower to work", but pt reporting continuing benefit. Pt working on obtaining disability, is currently on L&I. Pt asked RN to fax her last few clinic notes so that she could have them to take to social security appt  A/P:  RN faxed to pt her clinic visit notes from 12/19/09, 04/10/10, and 04/23/10, to pt home phone/fax number.

## 2010-05-18 NOTE — Telephone Encounter (Signed)
Message copied by Erik Obey on Fri May 18, 2010 1:55 PM  ------   Message from: Orlin Hilding T   Created: Fri May 18, 2010 10:07 AM   Contact: CHAPMAN     PT WANTS HER CURRENT DIAGNOSIS FOR HER BACK.   AND ANY SURGICAL INFORMATION REGARDING HER BACK. PT STATES DR CHAPMAN IS HAVING HER DO EPIDURALS RIGHT NOW L2-3 BEFORE DOING ANY SURGERY.     PLEASE FAX INFORMATION TO: [phone]     BEST NUMBER TO CALL IS THE SAME AS HER FAX [phone]     PLEASE CALL PT FIRST TO GIVE HER A HEADS UP BEFORE ANY FAXING SO SHE CAN SET UP HER FAX/ PHONE.      THANKS,     HUNG

## 2010-05-18 NOTE — Telephone Encounter (Signed)
RN spoke w/ pt, faxed over notes per her request to [phone]

## 2010-08-07 ENCOUNTER — Encounter (HOSPITAL_BASED_OUTPATIENT_CLINIC_OR_DEPARTMENT_OTHER): Payer: No Typology Code available for payment source | Admitting: Orthopaedic Surgery

## 2010-08-14 ENCOUNTER — Ambulatory Visit: Payer: No Typology Code available for payment source | Attending: Orthopaedic Surgery | Admitting: Orthopaedic Surgery

## 2010-08-14 ENCOUNTER — Encounter (HOSPITAL_BASED_OUTPATIENT_CLINIC_OR_DEPARTMENT_OTHER): Payer: Self-pay | Admitting: Orthopaedic Surgery

## 2010-08-14 VITALS — BP 130/80 | HR 70 | Ht 62.0 in | Wt 165.0 lb

## 2010-08-14 DIAGNOSIS — M549 Dorsalgia, unspecified: Secondary | ICD-10-CM | POA: Insufficient documentation

## 2010-08-14 DIAGNOSIS — Z981 Arthrodesis status: Secondary | ICD-10-CM | POA: Insufficient documentation

## 2010-08-15 LAB — PR RADEX SPINE LUMBOSACRAL 2/3 VIEWS

## 2010-08-16 ENCOUNTER — Encounter (HOSPITAL_BASED_OUTPATIENT_CLINIC_OR_DEPARTMENT_OTHER): Payer: Self-pay | Admitting: Orthopaedic Surgery

## 2010-08-16 NOTE — Patient Instructions (Signed)
Continue with physical activities as tolerated and epidural injections as needed.

## 2010-08-16 NOTE — Progress Notes (Signed)
CC: low back pain    HPI: This patient had previous surgery by Dr. Sibyl Parr for her cervical spine and has no problems with that currently. She now returns with low back pain. She originally had surgery of her lower lumbar spine done in [place] and then later revised to a posterior instrumented fusion of L4/5 by Dr. Corbin Ade. She has been having pain in her back radiating down the left leg for the past 1 yr. She has had two EPIs which have given her considerable pain relief.     O/E: her power is 5/5 in all lower extremity myotomes from L1-S1 bilaterally. She has normal sensation to light touch in all dermatomes. Her reflexes in lower extremities are graded as 2/4.     Impression/Plan: This patient has ongoing pain in her lumbar spine with some evidence of degenerative changes on Xray of adjacent segments to her previous surgery. She will continue to have epidural injections for her pain. She will be followed up in 1 year. She should continue to remain active.     We have spent greater than 15 minutes counselling this patient about her condition and treatment plan.

## 2010-08-19 ENCOUNTER — Other Ambulatory Visit: Payer: Self-pay

## 2010-08-22 ENCOUNTER — Telehealth (HOSPITAL_BASED_OUTPATIENT_CLINIC_OR_DEPARTMENT_OTHER): Payer: Self-pay | Admitting: Physical Medicine & Rehabilitation

## 2010-08-22 NOTE — Telephone Encounter (Addendum)
Left message with patient's husband to call Dr. Esperanza [facility] clinic to generate orders for another injection.  Thank you.    Homero Fellers      Message copied by Loyal Buba on Wed Aug 22, 2010  8:47 AM  ------       Message from: Jolaine Click       Created: Tue Aug 21, 2010  8:15 AM       Contact: Lesia Hausen,        She is not my pt. I have only seen her for injections. Dr Marlynn Perking team needs to complete any orders.        Thanks,       msk              ----- Message -----          From: Loyal Buba          Sent: 08/20/2010   4:36 PM            To: Lorayne Bender Dr. Carlos American,              Would you like to generate an injection referral?  Thank you.              Homero Fellers                     ----- Message -----          From: Crissie Figures          Sent: 08/20/2010   1:05 PM            To: [facility]              RE: ESI              PATIENT WOULD LIKE ANOTHER ESI.  PLEASE PUT ORDER IN SYSTEM AND CALL PATIENT ONCE SHE CAN SCHEDULE, THANKS!  (PATIENT STATES THAT DR. CHAPMAN TOLD HER IT IS OK TO HAVE ANOTHER ESI)              CALL PATIENT ON CELL PHONE BETWEEN 7AM AND 330PM AT [phone] AND OUTSIDE OF THAT TIME FRAME CALL HER AT HOME.

## 2010-08-26 ENCOUNTER — Other Ambulatory Visit: Payer: Self-pay

## 2010-09-04 ENCOUNTER — Telehealth (HOSPITAL_BASED_OUTPATIENT_CLINIC_OR_DEPARTMENT_OTHER): Payer: Self-pay

## 2010-09-04 NOTE — Telephone Encounter (Signed)
Message copied by Michela Pitcher on Tue Sep 04, 2010  2:40 PM  ------       Message from: Inda Merlin [facility]       Created: Tue Sep 04, 2010  2:10 PM       Contact: Sibyl Parr         Patient called- after her last visit she said Sibyl Parr told her she was supposed to get another injection with Carlos American. She was wondering when she would be able to schedule that.               Please call patient back at:  [phone], after 2PM [phone]

## 2010-09-04 NOTE — Telephone Encounter (Signed)
Nursing note:  S/O:  Danielle Small is a 64 year old female seen for axial back pain and lumbar radiculopathy, last on 08/14/10 with Dr. Verl Blalock.  A/P:  Will route to provider to generate orders for referral for injection.

## 2010-09-05 NOTE — Telephone Encounter (Signed)
Nursing note:  A/P:  LM for pt advising that I am working on generating order for referral/injection and she will be contacted to sched injection after orders rec'd.  Enc'd call back for questions/concerns.

## 2010-09-06 NOTE — Telephone Encounter (Signed)
Patient returning DD's call; informed her of what DD has documented below, that our staff would contact her once orders rec'd. Pt has no further questions at this time, agreeable with plan.

## 2010-09-06 NOTE — Telephone Encounter (Signed)
Nursing note:  A/P:  Orders generated per Dr. Verl Blalock; pt pending scheduling.

## 2010-09-10 ENCOUNTER — Other Ambulatory Visit (HOSPITAL_BASED_OUTPATIENT_CLINIC_OR_DEPARTMENT_OTHER): Payer: Self-pay | Admitting: Physical Medicine & Rehabilitation

## 2010-09-14 ENCOUNTER — Ambulatory Visit (HOSPITAL_BASED_OUTPATIENT_CLINIC_OR_DEPARTMENT_OTHER): Payer: No Typology Code available for payment source | Admitting: Physical Medicine & Rehabilitation

## 2010-09-19 ENCOUNTER — Telehealth (HOSPITAL_BASED_OUTPATIENT_CLINIC_OR_DEPARTMENT_OTHER): Payer: Self-pay | Admitting: Physical Medicine & Rehabilitation

## 2010-09-19 NOTE — Telephone Encounter (Signed)
Called the patient to start the Pre- ESI injection teaching. Had to leave a message for the patient to call me back. Waiting for the patient to call me back.

## 2010-09-20 NOTE — Telephone Encounter (Signed)
SPORTS & SPINE  INJECTION INSTRUCTIONS      PATIENT: Danielle Small    [id number]  Date of Procedure: 09/28/10  Provider performing procedure:  Allayne Gitelman, MD  Patient to follow-up with:  No follow up's currently  Procedure to be performed (including side):  Left L5/S1   Diagnostic Procedure Only:  NO  REQUIRED IMAGING IN PACS:  no   Patient reminded to bring MRI to appointment if completed outside the [place] system:  Yes (patient stated she will bring the MRI disc day of the injection)    PRE-INJ GENERAL INSTRUCTIONS:    Patient instructed to check in at the [facility], located NEXT DOOR on the 4th floor at the [facility] ([address] [place], [place], [place] [address]):  yes   Patient told to eat a light breakfast/lunch, but NPO 1 hr prior to procedure:  yes   Patient instructed on ride home:  yes   Patient instructed to call the [facility] @ [phone] and speak to the nurse:   1. If experincing any cold symptoms such fevers, chills, night sweating or coughing:  yes  2. If taking any antibiotics or being treated for an infection:  yes  3. If she has any additional questions BEFORE the INJ: yes  4. For day of the procedure, patient instructed to call the [facility] @ [phone]:  yes   Patient advised to take all medications as usually taken throughout the day (including blood pressure medications and diabetes medications), with the exception of any blood-thinning medications.  PATIENT ADVISED TO DISCONTINUE ANTI-COAG MEDS (**Please see List Below**):  Herbals (milk thistle)-- stop for 7 days, Vit E 400 IU for 7 days    **ANTI-COAG/MED GUIDELINES TO REVIEW WITH PATIENT BEFORE INJECTION PROCEDURES**  AXIAL INJECTIONS:   NSAIDs:  do not need to hold   ASA:  o < 300mg  ~ do not need to hold  o >/= 300mg  ~ hold for 7days   Clopidrogel (Plavix) ~ hold for 7days   Ticlopidine (Ticlid) ~ hold for 14days   Warfarin (Coumadin) ~ hold for 5days, check rapid INR on  that day,   o OK to proceed if INR </= 1.5   Aggrenox (ASA and Dipyridamole) ~ hold 7days   Dipyridamole (Persantine) ~ hold 7days   Tirofiban (Aggrastat) ~ hold 2days   Abciximab (ReoPro) ~ hold 2days   Eptifibatide (Integrilin) ~ hold 2days   Pletal and Trental  ~ hold for 2days   Orgaran ~ hold for 5days   Heparin, Lovenox, Innohep, Fragmin, Normiflo ~ hold for 24 hours   Vitamin E (greater than 400 IU daily) ~ hold for 7days   All herbals ~ hold for 7days  INTRA-ARTICULAR HIP/SHOULDER/KNEE, BURSAL/TENDON INJECTIONS:  Per discretion of provider   **OK to restart all medications evening following procedure.    ALL MEDICATION ALLERGIES REVIEWED BY:  Rodman Pickle on 09/20/2010    ADDITIONAL INFORMATION (If "YES" to any questions, notify RN or MD immediately):   Contrast / Iodine / Shellfish Allergy?  NO  1. Pre-Medication Ordered:  NO  2. IV: NO   Pre-Procedure STAT Rapid INR Needed?  NO  1. STAT Rapid INR Ordered:  NO   Patient is diabetic?  No  Patient is/may be pregnant?  No    POST-INJ GENERAL INSTRUCTIONS:    PATIENT INSTRUCTED TO ICE THE INJ SITE 10-15 minutes approximately 3-4 times a day on the day of the  INJ, and 10-15 minutes 3-4 times on the day following the INJ to assist with local discomfort:  yes     PATIENT INSTRUCTED TO CALL THE [facility] CLINIC immediately if she develops severe pain, redness, warmth, or swelling around the INJ site, shaking, chills, or fevers greater than 100F.:  yes     PATIENT INSTRUCTED THAT ACTIVITY SHOULD BE LIMITED on the day of the INJ and physical therapy and other exercises should be delayed for three (3) days following the INJ.  However, return to usual daily activities is expected the day after the INJ.  Typically, if the patient is working, she can expect to return to work the day after the INJ:   yes     IF PATIENT NEEDS ADDITIONAL MEDICATION, if required, it should be obtained from her referring physician:  yes     PATIENT INSTRUCTED THAT IF she  HAS ANY CONCERNS, INCLUDING BUT NOT LIMITED TO, worsening pain, numbness, leg weakness or significant bladder/bowel changes, please call our office immediately at [phone] and ask to speak to our Nurse:  yes     PATIENT TO FOLLOW-UP instructed:  yes      All of the patient's questions were answered.    Rodman Pickle  [facility]

## 2010-09-24 ENCOUNTER — Telehealth (HOSPITAL_BASED_OUTPATIENT_CLINIC_OR_DEPARTMENT_OTHER): Payer: Self-pay | Admitting: Orthopaedic Surgery

## 2010-09-24 NOTE — Telephone Encounter (Signed)
Dr. Sibyl Parr RN,    Lynann Beaver called in regards to patient's upcoming injection with Dr. Jonnie Finner.  The last visit notes were vague as to what level the injection will be performed.  If it's L4-5 as previous injections they will cover.  But as the last visit notes dictate it is L5-S1.  Caryn Bee is trying to verify this information.  Please give him a call ASAP with a response as the patient only has 24 hrs (20hrs as of this note) to have their injection authorized.  Thank you!    Homero Fellers

## 2010-09-27 ENCOUNTER — Telehealth (HOSPITAL_BASED_OUTPATIENT_CLINIC_OR_DEPARTMENT_OTHER): Payer: Self-pay | Admitting: Physician Assistant

## 2010-09-27 NOTE — Telephone Encounter (Signed)
Called and spoke to [employer] for peer to peer review regarding ESI being denied.  Pt has an ESI appt scheduled with Dr. Jonnie Finner of [facility].    Spoke the a RN who took down my contact info and will pass the info on to a MD, who will call and discuss the case with me.    We will contact pt to let her know her ESI appt with Dr. Jonnie Finner will be rescheduled to a later date, after her insurance company approves it (hopefully).

## 2010-09-28 ENCOUNTER — Ambulatory Visit (HOSPITAL_BASED_OUTPATIENT_CLINIC_OR_DEPARTMENT_OTHER): Payer: No Typology Code available for payment source | Admitting: Physical Medicine & Rehabilitation

## 2010-10-03 NOTE — Telephone Encounter (Signed)
Spoke to Dr. Lynann Beaver regarding the recent [facility] referral being denied.  Per Dr. Marlaine Hind, the referral was for left L5-S1 and in the past patient had left L3-4.      Explained to Dr. Marlaine Hind that the referral had an error- in that we meant to put in a referral for a repeat injection of the left L3-4, not L5-S1.  Dr. Marlaine Hind says that he agrees with the repeat left L3-4 injection referral and will OK it.    Called pt's home # and LM to let her know that the repeat injection for left L3 will be approved.  I will generate a new referral for her.

## 2010-10-04 ENCOUNTER — Telehealth (HOSPITAL_BASED_OUTPATIENT_CLINIC_OR_DEPARTMENT_OTHER): Payer: Self-pay

## 2010-10-04 NOTE — Telephone Encounter (Signed)
Spoke to the pt on the phone to let her know that I have spoke to a physician yesterday from her insurance company regarding the [facility] referral and Dr. Marlaine Hind has approved the [facility] referral.    Informed the pt that someone from [employer] will be calling her to schedule the injection.  Pt thanked me for the call.

## 2010-10-04 NOTE — Telephone Encounter (Signed)
Message copied by Mancel Parsons on Thu Oct 04, 2010 10:11 AM  ------       Message from: Derrill Memo       Created: Thu Oct 04, 2010  9:04 AM       Contact: chapman         Pt is returning Eching's call concerning her injection.  Please have Eching call her at [phone] or cell [phone]

## 2010-10-04 NOTE — Telephone Encounter (Signed)
Pt is 64 yo female seen for axial back pain and lumbar radiculopathy, last on 08/14/10 with Dr. Verl Blalock, was given referral for [facility] with Dr Carlos American.

## 2010-10-05 ENCOUNTER — Other Ambulatory Visit (HOSPITAL_BASED_OUTPATIENT_CLINIC_OR_DEPARTMENT_OTHER): Payer: Self-pay | Admitting: Physical Medicine & Rehabilitation

## 2010-10-05 NOTE — Progress Notes (Signed)
Referred by Harlan Stains for ESI.   Will ask RN to contact pt for pre-procedure teaching.   F/u 2-3 weeks after injection w/ Dr.Chapman.

## 2010-10-08 ENCOUNTER — Telehealth (HOSPITAL_BASED_OUTPATIENT_CLINIC_OR_DEPARTMENT_OTHER): Payer: Self-pay | Admitting: Physical Medicine & Rehabilitation

## 2010-10-08 NOTE — Telephone Encounter (Signed)
SPORTS & SPINE  INJECTION INSTRUCTIONS      PATIENT: Danielle Small    [id number]  Date of Procedure: 10/09/10  Provider performing procedure:  Bayard Beaver, MD  Patient to follow-up with:  No follow up's currently  Procedure to be performed (including side):  Bilateral L3 TFESI VS Caudal RSI   Diagnostic Procedure Only:  NO  REQUIRED IMAGING IN PACS:  no   Patient reminded to bring MRI to appointment if completed outside the [facility] system:  Yes (patient was told to get the disc originally on 09/20/10 and reminded to get the disc 10/05/10)     PRE-INJ GENERAL INSTRUCTIONS:    Patient instructed to check in at the [facility], located NEXT DOOR on the 4th floor at the [place] [facility] ([address] [place], [place], [place] [address]):  yes   Patient told to eat a light breakfast/lunch, but NPO 1 hr prior to procedure:  yes   Patient instructed on ride home:  yes   Patient instructed to call the [facility] @ [phone] and speak to the nurse:   1. If experincing any cold symptoms such fevers, chills, night sweating or coughing:  yes  2. If taking any antibiotics or being treated for an infection:  yes  3. If she has any additional questions BEFORE the INJ: yes  4. For day of the procedure, patient instructed to call the [facility] @ [phone]:  yes   Patient advised to take all medications as usually taken throughout the day (including blood pressure medications and diabetes medications), with the exception of any blood-thinning medications.  PATIENT ADVISED TO DISCONTINUE ANTI-COAG MEDS (**Please see List Below**):  Vitamin E -- stop for 7 days, Herbal--stop for 7 days    **ANTI-COAG/MED GUIDELINES TO REVIEW WITH PATIENT BEFORE INJECTION PROCEDURES**  AXIAL INJECTIONS:   NSAIDs:  do not need to hold   ASA:  o < 300mg  ~ do not need to hold  o >/= 300mg  ~ hold for 7days   Clopidrogel (Plavix) ~ hold for 7days   Ticlopidine (Ticlid) ~ hold for 14days   Warfarin (Coumadin)  ~ hold for 5days, check rapid INR on that day,   o OK to proceed if INR </= 1.5   Aggrenox (ASA and Dipyridamole) ~ hold 7days   Dipyridamole (Persantine) ~ hold 7days   Tirofiban (Aggrastat) ~ hold 2days   Abciximab (ReoPro) ~ hold 2days   Eptifibatide (Integrilin) ~ hold 2days   Pletal and Trental  ~ hold for 2days   Orgaran ~ hold for 5days   Heparin, Lovenox, Innohep, Fragmin, Normiflo ~ hold for 24 hours   Vitamin E (greater than 400 IU daily) ~ hold for 7days   All herbals ~ hold for 7days  INTRA-ARTICULAR HIP/SHOULDER/KNEE, BURSAL/TENDON INJECTIONS:  Per discretion of provider   **OK to restart all medications evening following procedure.    ALL MEDICATION ALLERGIES REVIEWED BY:  Rodman Pickle on 10/08/2010    ADDITIONAL INFORMATION (If "YES" to any questions, notify RN or MD immediately):   Contrast / Iodine / Shellfish Allergy?  NO  1. Pre-Medication Ordered:  NO  2. IV: NO   Pre-Procedure STAT Rapid INR Needed?  NO  1. STAT Rapid INR Ordered:  NO   Patient is diabetic?  No  Patient is/may be pregnant?  No    POST-INJ GENERAL INSTRUCTIONS:    PATIENT INSTRUCTED TO ICE THE INJ SITE 10-15 minutes approximately 3-4 times  a day on the day of the INJ, and 10-15 minutes 3-4 times on the day following the INJ to assist with local discomfort:  yes     PATIENT INSTRUCTED TO CALL THE [facility] immediately if she develops severe pain, redness, warmth, or swelling around the INJ site, shaking, chills, or fevers greater than 100F.:  yes     PATIENT INSTRUCTED THAT ACTIVITY SHOULD BE LIMITED on the day of the INJ and physical therapy and other exercises should be delayed for three (3) days following the INJ.  However, return to usual daily activities is expected the day after the INJ.  Typically, if the patient is working, she can expect to return to work the day after the INJ:   yes     IF PATIENT NEEDS ADDITIONAL MEDICATION, if required, it should be obtained from her referring physician:   yes     PATIENT INSTRUCTED THAT IF she HAS ANY CONCERNS, INCLUDING BUT NOT LIMITED TO, worsening pain, numbness, leg weakness or significant bladder/bowel changes, please call our office immediately at [phone] and ask to speak to our Nurse:  yes     PATIENT TO FOLLOW-UP instructed:  yes      All of the patient's questions were answered.    Rodman Pickle  [facility]

## 2010-10-09 ENCOUNTER — Ambulatory Visit: Payer: No Typology Code available for payment source | Attending: Physical Medicine & Rehabilitation

## 2010-10-09 ENCOUNTER — Ambulatory Visit (HOSPITAL_BASED_OUTPATIENT_CLINIC_OR_DEPARTMENT_OTHER): Payer: No Typology Code available for payment source | Admitting: Physical Medicine & Rehabilitation

## 2010-10-09 ENCOUNTER — Encounter (HOSPITAL_BASED_OUTPATIENT_CLINIC_OR_DEPARTMENT_OTHER): Payer: Self-pay

## 2010-10-09 VITALS — BP 137/92 | HR 83 | Temp 97.2°F | Ht 61.0 in | Wt 164.0 lb

## 2010-10-09 DIAGNOSIS — M51379 Other intervertebral disc degeneration, lumbosacral region without mention of lumbar back pain or lower extremity pain: Secondary | ICD-10-CM | POA: Insufficient documentation

## 2010-10-09 DIAGNOSIS — IMO0002 Reserved for concepts with insufficient information to code with codable children: Secondary | ICD-10-CM | POA: Insufficient documentation

## 2010-10-09 NOTE — Progress Notes (Signed)
[facility]       [address]  [address]  [place], [place]  [address]  TEL: [phone]  l  FAX: [phone]    Specializing in sports-related injuries and non-surgical care for conditions of the spine,  shoulder, elbow, wrist, hand, hip, knee, foot, and ankle.      10/09/2010    Danielle Small  [id number]      PROCEDURE:  Fluoroscopic guided left L3 transforaminal epidural steroid injection    DIAGNOSIS/INDICATION: left L3 radicular pain, and is s/p L4-5 fusion. Strength and sensation intact BLE. October 16, 2009 MRI Lspine reviewed: narrowing @ L3-4 and s/p L4-5 fusion. She has had ESIs in past w/ excellent symptomatic relief.  Please see clinic notes for additional information.  Allergies: is allergic to penicillin v.  Allergies were reviewed with the patient. No contraindications were identified.      CONSENT:    Potential risks including pain, serious infection, paralysis, nerve injury, spinal headache, allergic reaction, and others were discussed with the patient. The patient was given and read a patient information sheet regarding injection procedures prior to today's injection. The patient understands the benefits, risks, and alternatives of the procedure and agrees to the procedure today.  Written informed consent was obtained.       PROCEDURE TECHNIQUE:    The patient was escorted to the procedure suite and positioned prone on the procedure table. A brief pause occurred prior to procedure in which final verification was performed. The skin overlying the area was prepped and draped in the usual sterile fashion using Betadine.  Under fluoroscopic guidance the left L3 neuroforamen was located. Using a 25-gauge needle, the skin overlying the area was anesthetized with 1% lidocaine. Using intermittent fluoroscopic guidance, a 22 gauge 3  inch spinal needle was advanced to the region of the target nerve root.  A small amount of Omnipaque (iohexol) contrast  was instilled through extension tubing to identify the nerve root sleeve and assure no vascular tracking using live fluoroscopy. This was followed by the injection of 1.5 cc of 1% lidocaine and 2 cc of Celestone (betamethasine 6mg /mL) The needle was then flushed with 1% lidocaine and removed, the skin was cleansed, and a bandage placed over the injection site.  The patient was assisted to the seated position and then escorted to the post procedure recovery area in good condition.  The patient tolerated the procedure well.  Vital signs remained stable both pre- and post- procedure.   Complications: none  Pre-injection VAS: 4/10  Post-injection VAS: 2/10    POST-INJECTION PLAN  1.  The patient was given instructions regarding the use of ice and appropriate activity level over the next few weeks.  Patient was instructed to call my office immediately if there are any questions or problems.    2.  The patient will follow up w/ Dr Sibyl Parr in 2-3 weeks.     Procedure was completed w/ the assistance of Dr. Bethel Born, Fellow.  I was present for and assisted with the entire procedure.     Jolaine Click, MD  Attending/ [facility]    CC Dr Sibyl Parr

## 2010-10-11 ENCOUNTER — Telehealth (HOSPITAL_BASED_OUTPATIENT_CLINIC_OR_DEPARTMENT_OTHER): Payer: Self-pay | Admitting: Physical Medicine & Rehabilitation

## 2010-10-11 NOTE — Telephone Encounter (Signed)
Called the patient to do a post ESI follow up. Patient states "have no tenderness, no redness, no infection, no complications, and the pain is a 1/10. I am doing well". Told the patient I will pass the information on to MD [facility].

## 2010-10-16 NOTE — Progress Notes (Signed)
ORTHO SPINE POST-OPERATIVE VISIT    Ms. Aleshire presents today for evaluation 7 years status post C5-7 corpectomy done by Dr. Sibyl Parr. She also had previous L4-S1 fusion done by Dr. Corbin Ade after multiple previous surgeries.  She had a normal surgical procedure with no post-operative events. Since her last lumbar spine surgery, she has had two ESI for back pain and leg pain and has improved. She remains very active. She is here today for a referral for another ESI.    Patient Status:  Pain: Yes:  6/10  Other symptoms: No  Still taking pain medication: Yes  Post-operative care: n/a    Physical Examination:  General: healthy, alert, no distress  Orientation: oriented to person/place/time  Neuro:  Grossly normal to observation, gait normal  Incision: healed  LE motor strength: normal  LE sensation: normal    Patient Education:  Discussed with the patient that the waxing and waning of neurologic symptoms postoperatively is within normal limits.     Imaging: Her most recent MRI shows stenosis at L3/4, worse on the left side.     Assessment  Normal post-operative recovery.    Plan/Pt instructions  Exercise/Therapy recommendations: none  Orders/Rx's/Refills provided to patient: Patient was given a referral for an L3/4 ESI today.   Advice regarding Re-start of meds discontinued for surgery: not applicable  Answer to patient questions: questions answered to patient's satisfaction.  Follow-up instructions: in 6 months with an Xray of C-spine and L-spine.  15 minutes of this 20 min visit were spent in counseling.

## 2011-01-30 ENCOUNTER — Telehealth (HOSPITAL_BASED_OUTPATIENT_CLINIC_OR_DEPARTMENT_OTHER): Payer: Self-pay | Admitting: Physical Medicine & Rehabilitation

## 2011-01-30 NOTE — Telephone Encounter (Signed)
Message copied by Rodman Pickle on Wed Jan 30, 2011  8:50 AM  ------       Message from: Doris Cheadle RENEE       Created: Tue Jan 29, 2011  4:25 PM       Contact: Carlos American         Pt has an inj on 10/09/10 and was denied by her insurance; needs medication info stating why the procedure was necessary for the pt.              Please contact Case Manager(no specific person in charge of case) :[phone]       ID #[id number]              Subscriber:       Devoria Albe              Please contact Harriett Sine at: [phone]

## 2011-01-30 NOTE — Telephone Encounter (Signed)
Patient is a 65 years old female with a history 7 years status post C5-7 corpectomy done by Dr. Sibyl Parr. She also had previous L4-S1 fusion done by Dr. Corbin Ade after multiple previous surgeries. She had a normal surgical procedure with no post-operative events. Since her last lumbar spine surgery, she has had two ESI for back pain and leg pain and has improved. Patient had a ESI on 10/09/10 with MD Carlos American.     Called the patient's insurance spoke to [place] she states this is authorization department. We approved the injection for the the cpt code [address] for the date 10/09/10 (Case number # [id number]). Call the patients insurance and ask them what they want  [employer]) speak to them [phone].     Case number # [id number], because all the codes, [employer] insurance company) speak to them [phone]. Spoke to [place] she states that the case number does not have enough numbers in it and based in the patients ID number I date of service and the dollar amount in question to help you. For this date (10/10/11) there is a lot of bills listed. To proceed further i need this information.     Called the patient and left message for the patient to call with the dollar amount in question, case number. Will wait for the patient to call back.

## 2011-01-30 NOTE — Telephone Encounter (Signed)
Patient calls back and states that "the 10/09/10 they have paid the claim on this (md chapman did a peer to peer review). The date of service I have a concern for is the 04/10/10 case number # [id number]) and the second claim # [id number] ($1750.02) (don't know why there is 2 case numbers), they state they need medical records, ct, mri, history , and progress note".     Spoke to [employer] and spoke to [place] and she states we sent out letters to [place] on Oct 18, and Jan 19 and the patient. [place] denied the claim for the 04/10/10 date. Call the patient to get the fax number where records needs to go.     Called the patient to request the medical records need to go for her insurance. Patient was agreeable to faxing the information to sports/spine fax machine. Will wait for the letter and then send necessary information for the patient.

## 2011-01-31 NOTE — Telephone Encounter (Signed)
Faxed record for the patient to [facility] at Fax- [phone]. Received confirmation for the fax that was sent.

## 2011-01-31 NOTE — Telephone Encounter (Signed)
Called the patient and informed her that information was sent to her insurance company. We need to wait for them to process the information. Will check with the insurance company the week of February 13th. Patient was agreeable to plan of care.

## 2011-02-01 NOTE — Telephone Encounter (Signed)
Returned call to [facility]   -representative stated 04/10/2010 has been approved after peer-to peer review but cannot give authorization number because it must  go through  through [facility] for retroactive review ph [phone] opt 5 . Case number [id number].  RN called and spoke with rep who said that all paperwork needs to be resubmitted now to [employer] office [address]  [address] [employer] [address]. She indicated that they do pre-authorization but not retroactive. Will defer to [facility] RN who was dealing with this.  James Ivanoff RN

## 2011-02-01 NOTE — Telephone Encounter (Signed)
Message copied by Lorre Munroe on Fri Feb 01, 2011  9:38 AM  ------       Message from: Marygrace Drought       Created: Caleen Essex Feb 01, 2011  8:37 AM       Contact: Geronimo Running from [facility] pre-cert called. States that [place] had faxed something in to them and she needed to speak with him              Please call Carney Bern back at: [phone]

## 2011-02-11 NOTE — Telephone Encounter (Signed)
Called [facility] [phone] claim number # [id number] and case number # [id number] they stated they do not handle the retro cases they need to go to [facility] [phone]). Marisue Humble from [place] stated it needs to go to [facility] fax the information to [phone]. Then stated you need to call [employer]. Called and spoke to PAT and she states not sure for this date of service. Will try again tomorrow.

## 2011-02-12 NOTE — Telephone Encounter (Signed)
Called [employer] spoke with Joice Lofts states this is retroactive case needs to go to [facility] [phone] option 5  (callwas transferred). Spoke to [place] at [place] [facility] case. States fax the information to me at [phone] ATTN: Steward Drone. Faxed the report to [place] at [place]. Received confirmation for the fax that was sent. Will wait for insurance company to respond.

## 2011-02-21 NOTE — Telephone Encounter (Signed)
[facility] wise spoke with Era Bumpers and gave the patient a id # and the necessary information.     Era Bumpers stated "the claim number is [id number] for 04/10/10 $1750.02, we have a 6 week back log for processing the case for the retroactive cases. Call back in a few weeks to see where we are in the process. Since this is going to be the third appeal".    Called the patient and told her the company has a 6 week back log for processing the case.     Patient states "I will talk to the union rep and try to get this moved up quicker and if I hear anything then I will call you back with a update".

## 2011-03-15 NOTE — Telephone Encounter (Signed)
Message being forwarded to the Manger and she will forward it to the necessary people to help with the approval of the ESI for 04/10/10. Closing the TE.

## 2011-03-15 NOTE — Telephone Encounter (Signed)
Called [employer] spoke with [place]. She states I need to speak to the person who processed the case and ask them if they have reviewed the information that you sent on 02/12/11 for this case. Have   Appeal needs to come from the surgeon for the procedure that was done.  You need to call [phone] and ask them to process the case".    Called the [phone] [facility] and they stated you need to talk with [employer]. They transfer me to [employer]. Spoke with Carolina Sink and she states this has been denied 3 times already the last on was 03/15/11. They are saying that this procedure was medically not necessary, it did not supporting documentation, and the procedure with out any prior approval (date in question 04/11/11). So when you called use we were doing the right thing by sending you to [facility]".    [employer] representative was on line at the same time and states that we don't see anything in our systems for the date in question and since this is retroactive case then it needs to go to [facility].     Carolina Sink from [place] states send more documentation to get this approved to fax number [phone].     Advised both parties we did send record in and patient had the same procedure in sept or oct 2011 and the case was approved. They stated there was a prior auth approved for that procedure in the fall.

## 2011-05-13 ENCOUNTER — Telehealth (HOSPITAL_BASED_OUTPATIENT_CLINIC_OR_DEPARTMENT_OTHER): Payer: Self-pay

## 2011-05-13 NOTE — Telephone Encounter (Addendum)
CONFIRMED PHONE NUMBER: [phone]  CALLERS FIRST AND LAST NAME: Jaquita Rector  FACILITY NAME: n/a TITLE: n/a  CALLERS RELATIONSHIP:Self  RETURN CALL: Detailed message on voicemail only    SUBJECT: Procedure Billing/Insurance Authorization  REASON FOR REQUEST: Insurance carrier requires further information from Dr. Sibyl Parr about the reason for PT receiving injections. Insurance is denying covering cost of injection at this time.     Please contact: [employer] rep. Ramona at telephone [phone]   ref. Claim #[id number]    HAVE YOU DISCUSSED THIS WITH THE BILLING OFFICE YET? YES  CONCERN: Insurance carrier is denying covering injections until further info. Is provided.  DATE OF VISIT/SERVICE: 10/09/2010     Pt is 65 yo female with history C5-7 corpectomy done by Dr. Sibyl Parr 01/26/03. She also had previous L4-S1 fusion done by Dr. Corbin Ade after multiple previous surgeries.    The patient underwent an initial ESI to the left L3 level 12/12/09 for left low back pain and radiating left ant. thigh pain and reported excellent relief for approximately 3 months with gradual return of her left low back pain and radiating left ant. thigh pain.  The pt had a second ESI to this area performed by Dr. Bayard Beaver on 04/10/10 with report her symptoms were much improved.  Pt received third ESI 10/09/10 by Dr Carlos American.    Left message for Ramona to call back.

## 2011-05-14 ENCOUNTER — Encounter (HOSPITAL_BASED_OUTPATIENT_CLINIC_OR_DEPARTMENT_OTHER): Payer: Self-pay | Admitting: Orthopaedic Surgery

## 2011-05-14 ENCOUNTER — Ambulatory Visit: Payer: No Typology Code available for payment source | Attending: Orthopaedic Surgery | Admitting: Orthopaedic Surgery

## 2011-05-14 VITALS — BP 130/80 | HR 83 | Ht 60.75 in | Wt 159.0 lb

## 2011-05-14 DIAGNOSIS — Z981 Arthrodesis status: Secondary | ICD-10-CM | POA: Insufficient documentation

## 2011-05-14 DIAGNOSIS — M5430 Sciatica, unspecified side: Secondary | ICD-10-CM | POA: Insufficient documentation

## 2011-05-14 DIAGNOSIS — M5380 Other specified dorsopathies, site unspecified: Secondary | ICD-10-CM | POA: Insufficient documentation

## 2011-05-14 NOTE — Op Note (Signed)
Leslie Hensley, Leslie Hensley               ACCOUNT NO.:  [id number]   MEDICAL RECORD NO.:  [id number]          PATIENT TYPE:  AMB   LOCATION:  DAY                           FACILITY:  [facility]   PHYSICIAN:  Kassie Mends, M.D.      DATE OF BIRTH:  July 23, 1946   DATE OF PROCEDURE:  06/18/2007  DATE OF DISCHARGE:                               OPERATIVE REPORT   PROCEDURE:  Colonoscopy.   INDICATIONS FOR PROCEDURE:  Ms. Garlitz is a 65 year old female who has  a personal history of polyps.  She has a personal history of tubular  adenoma.  She was taking aspirin and noticed blood with her bowel  movements.  She had a brother who had colon cancer at the age of 61.  She presents for evaluation for rectal bleeding.   FINDINGS:  1. Sigmoid colon diverticulosis.  Otherwise no polyps, masses,      inflammatory changes or AVMs.  2. Small internal hemorrhoids.  Otherwise normal retroflexed view of      the rectum.   RECOMMENDATIONS:  1. High fiber diet.  She is given a handout on high-fiber diet,      hemorrhoids, and diverticulosis.  2. Screening colonoscopy in 5 years due to a history of polyps and a      first-degree relative with colon cancer diagnosed before the age of      21.   MEDICATIONS:  1. Demerol 100 mg IV.  2. Versed 6 mg IV.   PROCEDURE TECHNIQUE:  Physical exam was performed, and informed consent  was obtained from the patient after explaining the benefits, risks, and  alternatives to the procedure.  The patient was connected to the monitor  and placed in the left lateral position.  Continuous oxygen was provided  by nasal cannula and IV medicine administered through an indwelling  cannula.  After administration of sedation and rectal exam, the  patient's rectum was intubated  with the pediatric colonoscope, and the scope was advanced under direct  visualization to the cecum.  The scope was subsequently removed slowly  by carefully examining the color, texture, anatomy, and integrity  of the  mucosa on the way out.  The patient was recovered in endoscopy and  discharged home in satisfactory condition.      Kassie Mends, M.D.  Electronically Signed     SM/MEDQ  D:  06/18/2007  T:  06/18/2007  Job:  [id number]   cc:   Wendy Poet  Fax: [phone]

## 2011-05-14 NOTE — Progress Notes (Signed)
CC/HPI:  Danielle Small is a 65 year old female Last seen on 08/16/2010 4 lumbar pain, and she has a history of a PS I asked at the L4/5 level that was previously performed by an outside physician.. In the interim, she states that she has had worsening lumbar pain and some left-sided symptoms that radiate down her lateral and posterior thigh. She states that the symptoms have been worsening since early April, and about mid April she had pneumonia which caused her to be bedbound for a number of days. She denies any weakness. She has recently lost her job, and is decided to retire. Because of this she has had limited access to her usual exercise routine in a pool. She is taking Tylenol which helps minimally with her symptoms, and takes opioids occasionally.    She notes that epidural steroid injections previously gave her approximately 8 months of relief, however the most recent injection in November only gave her proximally 2 weeks of relief. She was like to try them again if possible.    She notes that extension and bending over make her symptoms worse, coughing does not seem to affect it. She is independent in her ADLs, but does IADLs with assistance. She is currently worried about her worsening of symptoms.      Social update: she continues to live in [place], [place].    Past Medical History   Diagnosis Date   . GENERAL OSTEOARTHROSIS    . ACUTE PEPTIC ULCER W HEMORR    . MUSCLE/LIGAMENT DIS NOS      right knee pain     Past Surgical History   Procedure Date   . Referral to sports and spine    . Neck/chest procedure unlisted C spine fusion      2003 by Dr. Sibyl Parr   . Spine surgery procedure unlisted L4/5 fusion      done in 1998 and revised later by Dr. Genevieve Norlander     family history includes Cancer in an other family member; Diabetes in an other family member; and Other in an other family member.  Social History     Occupational History   . Not on file.     Social History Main Topics   . Smoking status:  Never Smoker    . Smokeless tobacco: Never Used   . Alcohol Use: No   . Drug Use: No   . Sexually Active: Not on file     Allergies: Penicillin v    She has a current medication list which includes the following prescription(s): acetaminophen-codeine, vitamin c, atenolol, butalbital-asa-caff-codeine, calcium carbonate, calcium-magnesium-vitamin d, carisoprodol, chlorphen tan-phenyleph tan, cyclobenzaprine hcl, fenofibrate, iron, gabapentin, levocetirizine dihydrochloride, lisinopril, loratadine, lovastatin, metformin hcl, milk thistle, multiple vitamin, omeprazole, potassium gluconate, pyridoxine hcl, sertraline hcl, tramadol hcl, and vitamin e.    ROS: unchanged    Physical exam:    GENERAL:  Danielle Small is a 65 year old female in no apparent distress.     PSYCHIATRIC: The patient is alert and oriented.     PULMONARY: The patient has Nonlabored breathing    GI: The abdomen is nontender  SKIN: Exam is normal      Posture:      Spine Exam:     LUMBAR SPINE    VASCULAR: posterior tibialis pulses are palpable bilaterally.  Capillary refill is < 3 seconds.   MUSCULOSKELETAL:  The patient has pain with range of motion of the right and left hip  and low back .   NEUROLOGICAL:     SENSATION RIGHT LEFT   Back normal  normal    Anterior medial thigh (L1, L2, L3) normal  normal    Anterior lateral thigh/medial calf (L4) normal  normal    Lateral thigh, lateral calf (L5) normal  Decreased in thigh   Posterior thigh, calf (S1) normal  normal              MOTOR STRENGTH RIGHT LEFT   Hip Flexion 5/5 5/5   Knee extension 5/5 5/5   Knee flexion  5/5 5/5   Dorsiflexion 5/5 5/5   EHL 5/5 5/5   Plantar flexion 5/5 5/5     REFLEXES     Knee  2+ 2+   Ankle  2+ 2+   Babinski absent absent   Clonus  0 beats 0 beats               Straight leg raise is negative on the right and left     Gait is normal     Pt is able to heel walk bilaterally.  Pt is able to toe walk bilaterally.  Pt is able to tandem walk  bilaterally                 Imaging:  None    Assessment:    Danielle Small is a 65 year old female  with the clinical presentation of axial back pain with occ radiation down L thigh. She had excellent response to epidural steroid injections prior to her last injection. We feel it is reasonable to try one more injection to see if she can get improvement in her symptoms. She will be restarting her physical activity in the pool she was doing previous to losing her job. 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 We feel that the epidural steroid injections will help her resume her therapy, and avoid possible surgery.    Return to clinic as needed.

## 2011-05-15 LAB — PR RADEX SPINE LUMBOSACRAL 2/3 VIEWS

## 2011-05-17 ENCOUNTER — Telehealth (HOSPITAL_BASED_OUTPATIENT_CLINIC_OR_DEPARTMENT_OTHER): Payer: Self-pay

## 2011-05-17 NOTE — Telephone Encounter (Signed)
-----   Message -----  From: Jerry Caras: 05/17/2011 11:48 AM  To: Kevan Roseb[facility] Admin Pool  Subject: FW: Pain Clinic [facility] Information         ----- Message -----  From: Jerry Caras: 05/17/2011 11:16 AM  To: Mar[facility] Lunch Pain Rn Pool  Subject: Pain Clinic [facility] Medical Information     CONFIRMED PHONE NUMBER: [phone]  CALLERS FIRST AND LAST NAME: Glenetta Hew  FACILITY NAME: NA TITLE: NA  CALLERS RELATIONSHIP:Self  RETURN CALL: Detailed message on voicemail only    SUBJECT: General Message   REASON FOR REQUEST: Medical Information    MESSAGE: Patient called to get a message over to [facility] assistant - Dr. Sibyl Parr has recommended another shot in patient's back, but this may take a while due to patient's insurance. Please call and advise.  FORWARD TO: NA

## 2011-05-17 NOTE — Telephone Encounter (Signed)
Pt called because Dr Sibyl Parr wants her to have another injection with Dr Carlos American, but there is no referral/orders in there yet for me to schedule off of. She has a lot of problems getting these authorized (justification by Dr Sibyl Parr that was not followed up on with written information) and would like to make sure his team is fully aware of this prior to her getting this.    Please put in the injection referral and we'll schedule ; if there are codes we can start the authorization process     Assessment and Plan:   65 year old female last seen 5/15 with the clinical presentation of axial back pain with occ radiation down L thigh.Note indicates "She had excellent response to epidural steroid injections prior to her last injection. We feel it is reasonable to try one more injection to see if she can get improvement in her symptoms" no referral put in.  Will email Dr. Sibyl Parr to confirm.

## 2011-05-20 NOTE — Telephone Encounter (Signed)
Called and spoke to pt regarding injection referral.  Pt saw Dr. Sibyl Parr on 05/14/2011 and the plan is for pt to continue with ESI restarting exercising in the pool.  Below is part of the clinic note:    "Assessment:   Danielle Small is a 65 year old female with the clinical presentation of axial back pain with occ radiation down L thigh. She had excellent response to epidural steroid injections prior to her last injection. We feel it is reasonable to try one more injection to see if she can get improvement in her symptoms. She will be restarting her physical activity in the pool she was doing previous to losing her job. 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 We feel that the epidural steroid injections will help her resume her therapy, and avoid possible surgery.   Return to clinic as needed."    Informed the pt that I will make the [facility] referral for her.  Pt states she would like to continue getting the injection with Dr. Carlos Small.  Verified that pt is still having the same left leg pain in the same distribution.  Will make the referral for left L3-4 ESI with Dr. Carlos Small.    Pt informed me that she is still having problem with her insurance company to pay the [facility] she received in Oct 2011 with Dr. Carlos Small.  Printed out her Aug 2011 clinic note which indicated continuing with ESI, also printed out the TE I have written when I spoke to Dr. Lynann Beaver, who approved the [facility], from her insurance company and will put both notes in the mail for her today so she may forward them to her insurance company.    ESI referral made and given to Dr. Drucilla Schmidt RN, Raelyn Mora.

## 2011-05-20 NOTE — Telephone Encounter (Signed)
Called the patient on her home phone for the patient to call back to do pre-epidural injection teaching and help her schedule the appointment. Will wait for the patient to call back.     SPORTS & SPINE  INJECTION INSTRUCTIONS      PATIENT: Danielle Small    [id number]  Date of Procedure:  06/04/11  Provider performing procedure:  Bayard Beaver, MD  Patient to follow-up with:  MD Sibyl Parr  Procedure to be performed (including side):  Left L3 ESI   Diagnostic Procedure Only:  NO  REQUIRED IMAGING IN PACS:  yes   Patient reminded to bring MRI to appointment if completed outside the [facility] system:  no    PRE-INJ GENERAL INSTRUCTIONS:    Patient instructed to check in at the [facility], located NEXT DOOR on the 4th floor at the [facility] ([address] [place], [place], [place] [address]):  yes   Patient told to eat a light breakfast/lunch, but NPO 1 hr prior to procedure:  yes   Patient instructed on ride home:  yes   Patient instructed to call the [facility] @ [phone] and speak to the nurse:   1. If experincing any cold symptoms such fevers, chills, night sweating or coughing:  yes  2. If taking any antibiotics or being treated for an infection:  yes  3. If she has any additional questions BEFORE the INJ: yes  4. For day of the procedure, patient instructed to call the [facility] @ [phone]:  yes   Patient advised to take all medications as usually taken throughout the day (including blood pressure medications and diabetes medications), with the exception of any blood-thinning medications.  PATIENT ADVISED TO DISCONTINUE ANTI-COAG MEDS (**Please see List Below**):  N/A (Patient not taking medication(s))    **ANTI-COAG/MED GUIDELINES TO REVIEW WITH PATIENT BEFORE INJECTION PROCEDURES**  AXIAL INJECTIONS:   NSAIDs:  do not need to hold   ASA:  o < 300mg  ~ do not need to hold  o >/= 300mg  ~ hold for 7days   Clopidrogel (Plavix) ~ hold for 7days   Ticlopidine (Ticlid)  ~ hold for 14days   Warfarin (Coumadin) ~ hold for 5days, check rapid INR on that day,   o OK to proceed if INR </= 1.5   Aggrenox (ASA and Dipyridamole) ~ hold 7days   Dipyridamole (Persantine) ~ hold 7days   Tirofiban (Aggrastat) ~ hold 2days   Abciximab (ReoPro) ~ hold 2days   Eptifibatide (Integrilin) ~ hold 2days   Pletal and Trental  ~ hold for 2days   Orgaran ~ hold for 5days   Heparin, Lovenox, Innohep, Fragmin, Normiflo ~ hold for 24 hours   Vitamin E (greater than 400 IU daily) ~ hold for 7days   All herbals ~ hold for 7days  INTRA-ARTICULAR HIP/SHOULDER/KNEE, BURSAL/TENDON INJECTIONS:  Per discretion of provider   **OK to restart all medications evening following procedure.    ALL MEDICATION ALLERGIES REVIEWED BY:  Rodman Pickle on 05/20/2011    ADDITIONAL INFORMATION (If "YES" to any questions, notify RN or MD immediately):   Contrast / Iodine / Shellfish Allergy?  NO  1. Pre-Medication Ordered:  NO  2. IV: NO   Pre-Procedure STAT Rapid INR Needed?  NO  1. STAT Rapid INR Ordered:  NO   Patient is diabetic?  No  Patient is/may be pregnant?  No    POST-INJ GENERAL INSTRUCTIONS:    PATIENT INSTRUCTED TO ICE  THE INJ SITE 10-15 minutes approximately 3-4 times a day on the day of the INJ, and 10-15 minutes 3-4 times on the day following the INJ to assist with local discomfort:  yes     PATIENT INSTRUCTED TO CALL THE [facility] CLINIC immediately if she develops severe pain, redness, warmth, or swelling around the INJ site, shaking, chills, or fevers greater than 100F.:  yes     PATIENT INSTRUCTED THAT ACTIVITY SHOULD BE LIMITED on the day of the INJ and physical therapy and other exercises should be delayed for three (3) days following the INJ.  However, return to usual daily activities is expected the day after the INJ.  Typically, if the patient is working, she can expect to return to work the day after the INJ:   yes     IF PATIENT NEEDS ADDITIONAL MEDICATION, if required, it should be  obtained from her referring physician:  yes     PATIENT INSTRUCTED THAT IF she HAS ANY CONCERNS, INCLUDING BUT NOT LIMITED TO, worsening pain, numbness, leg weakness or significant bladder/bowel changes, please call our office immediately at [phone] and ask to speak to our Nurse:  yes     PATIENT TO FOLLOW-UP instructed:  yes      All of the patient's questions were answered.    Rodman Pickle  [facility]

## 2011-05-24 ENCOUNTER — Telehealth (HOSPITAL_BASED_OUTPATIENT_CLINIC_OR_DEPARTMENT_OTHER): Payer: Self-pay | Admitting: Physical Medicine & Rehabilitation

## 2011-05-24 NOTE — Telephone Encounter (Signed)
Called the patient and advised her that we are going to hold off on the vit e, mvi, and the milk thistle 7 days before the injection. Patient was agreeable with plan of care. Closing the TE.

## 2011-05-31 NOTE — Progress Notes (Signed)
VL 06.01: Attending did not add a Progress Note; closing the encounter with NO CHG for LOS.     Vanessa Lipton, POS   [facility]   [facility]

## 2011-06-03 ENCOUNTER — Telehealth (HOSPITAL_BASED_OUTPATIENT_CLINIC_OR_DEPARTMENT_OTHER): Payer: Self-pay | Admitting: Physical Medicine & Rehabilitation

## 2011-06-03 NOTE — Telephone Encounter (Signed)
Received a from [facility] and she states the insurance has approved the injection for the patient.     Called the patient with the information. She was agreeable with getting the injection done tomorrow. Closing the TE.

## 2011-06-03 NOTE — Telephone Encounter (Signed)
Patient is a 65 years old female with a history of 7 years status post C5-7 corpectomy done by Dr. Sibyl Parr. She also had previous L4-S1 fusion done by Dr. Corbin Ade after multiple previous surgeries. She had a normal surgical procedure with no post-operative events. Since her last lumbar spine surgery, she has had two ESI for back pain and leg pain and has improved. Patient had a ESI on 10/09/10 with MD Carlos American. She is scheduled for a ESI on 06/04/11.     Called [facility] and spoke with "Andrey Cota and she states that Misty Stanley will look at this and see what is going on for the patient concerning the referral and call you back".     Will wait for the Misty Stanley to call back with injection referral update.

## 2011-06-03 NOTE — Telephone Encounter (Signed)
Patient called- wants to verify her insurance has ok'd the injection for tomorrow.    Please call patient back at: [phone]

## 2011-06-04 ENCOUNTER — Ambulatory Visit
Payer: No Typology Code available for payment source | Attending: Physical Medicine & Rehabilitation | Admitting: Physical Medicine & Rehabilitation

## 2011-06-04 ENCOUNTER — Ambulatory Visit (HOSPITAL_BASED_OUTPATIENT_CLINIC_OR_DEPARTMENT_OTHER): Payer: No Typology Code available for payment source | Admitting: Physical Medicine & Rehabilitation

## 2011-06-04 DIAGNOSIS — IMO0002 Reserved for concepts with insufficient information to code with codable children: Secondary | ICD-10-CM | POA: Insufficient documentation

## 2011-06-04 DIAGNOSIS — M5126 Other intervertebral disc displacement, lumbar region: Secondary | ICD-10-CM | POA: Insufficient documentation

## 2011-06-04 DIAGNOSIS — M51379 Other intervertebral disc degeneration, lumbosacral region without mention of lumbar back pain or lower extremity pain: Secondary | ICD-10-CM | POA: Insufficient documentation

## 2011-06-04 NOTE — Progress Notes (Signed)
[facility]       [address]  [id number]  [place], [place]  [address]  TEL: [phone]  l  FAX: [phone]    Specializing in sports-related injuries and non-surgical care for conditions of the spine,  shoulder, elbow, wrist, hand, hip, knee, foot, and ankle.      06/04/2011    Danielle Small  [id number]      PROCEDURE:  Fluoroscopic guided left L3 transforaminal epidural steroid injection    DIAGNOSIS/INDICATION: left L3 radicular pain, and is s/p L4-5 fusion. Strength and sensation intact BLE. October 16, 2009 MRI Lspine reviewed: narrowing @ L3-4 and s/p L4-5 fusion. She has had ESIs in past w/ excellent symptomatic relief. Referred by Dr Sibyl Parr for repeat injection.  Please see clinic notes for additional information.  Please see clinic notes for additional information.  Allergies: is allergic to penicillin v.  Allergies were reviewed with the patient. No contraindications were identified.      CONSENT:    Potential risks including pain, serious infection, paralysis, nerve injury, spinal headache, allergic reaction, and others were discussed with the patient. The patient was given and read a patient information sheet regarding injection procedures prior to today's injection. The patient understands the benefits, risks, and alternatives of the procedure and agrees to the procedure today.  Written informed consent was obtained.     PROCEDURE TECHNIQUE:    The patient was escorted to the procedure suite and positioned prone on the procedure table. A brief pause occurred prior to procedure in which final verification was performed. The skin overlying the area was prepped and draped in the usual sterile fashion using Betadine.  Under fluoroscopic guidance the left L3 neuroforamen was located. Using a 25-gauge needle, the skin overlying the area was anesthetized with 1% lidocaine. Using intermittent fluoroscopic guidance, a 22 gauge 3  inch spinal needle was  advanced to the region of the target nerve root.  A small amount of Omnipaque (iohexol) contrast was instilled through extension tubing to identify the nerve root sleeve and assure no vascular tracking using live fluoroscopy and digital subtraction angiography. This was followed by the injection of 1.5 cc 1% lidocaine and 2 cc of Celestone (betamethasone 6mg /mL) The needle was then flushed with 1% lidocaine and removed, the skin was cleansed, and a bandage placed over the injection site.  The patient was assisted to the seated position and then escorted to the post procedure recovery area in good condition.  The patient tolerated the procedure well.  Vital signs remained stable both pre- and post- procedure.   Complications: none    POST-INJECTION PLAN  1.  The patient was given instructions regarding the use of ice and appropriate activity level over the next few weeks.  Patient was instructed to call my office immediately if there are any questions or problems.    2.  The patient will follow up w/ Dr Sibyl Parr in 2-3 weeks.     Jolaine Click, MD  Attending/[facility]      CC Dr Sibyl Parr

## 2011-06-05 ENCOUNTER — Telehealth (HOSPITAL_BASED_OUTPATIENT_CLINIC_OR_DEPARTMENT_OTHER): Payer: Self-pay | Admitting: Physical Medicine & Rehabilitation

## 2011-06-05 NOTE — Telephone Encounter (Signed)
PSS transferred the call.    Patient received a Fluoroscopic guided L L3 transforaminal epidural steroid injection yesterday.     Patient calls and states "I am getting severe pain which starts from the lower back then goes on the hip then around the front of the thigh and stops at the knee. The pain is currently a 8-9/10 when I start to move around and walk, when I am laying down in bed the pain is 2-3/10. I have full control of the bowel/bladder, and the pain is similar to the pain I have had before at these levels.  I am taking tylenol po for the pain, and I am icing the area. I take the soma 1 pill in the morning and 1 in the evening. I have gotten this pain before the injection and at these level. I was hoping the injection was going to work for me. I really can not take ibuprofen because this upsets the stomach. I want to make sure that there is nothing wrong".    Advised the patient that she is doing everything correctly for the pain. Sometimes it may take a few days or even take up to 2 weeks for the injection to work, I want you to hang in there, take it easy like you are, ice the area, and take tylenol stay below the 3000 mg in 24 hour period. I will call you tomorrow and see how you are doing. Patient was agreeable with plan of care.

## 2011-06-06 NOTE — Telephone Encounter (Signed)
Called the patient and she states the "the pain has gone down a little bit for me. Last night I was able to sleep. The pain is still there but not to the degree as it was yesterday or day before. I will continue to ice, take the ibuprofen and tylenol as need for the pain. I am still taking it easy, and the pain seems to be going down slowly".    Advised the patient please call us if things change and continue to ice the area, and ibuprofen/tylenol and stay below the 2400 mg for ibuprofen and 3000 mg for tylenol). Patient was agreeable with the plan of care. Closing the TE.

## 2011-07-09 ENCOUNTER — Telehealth (HOSPITAL_BASED_OUTPATIENT_CLINIC_OR_DEPARTMENT_OTHER): Payer: Self-pay | Admitting: Physical Medicine & Rehabilitation

## 2011-07-09 NOTE — Telephone Encounter (Signed)
Called the patient and left message for the patient to make a follow up plan to discuss what would be the best plan of care in the future for you. If you have any questions please call me back. Closing the TE.

## 2011-07-09 NOTE — Telephone Encounter (Signed)
Reviewed letter from Ms Danielle Small re: her recent injection. As she is a pt of Dr Esperanza Sheets, recommend that she f/u w/ him for further recommendations. Will ask RN to contact pt.

## 2011-08-22 ENCOUNTER — Telehealth (HOSPITAL_BASED_OUTPATIENT_CLINIC_OR_DEPARTMENT_OTHER): Payer: Self-pay

## 2011-08-22 NOTE — Telephone Encounter (Addendum)
Message copied by De Hollingshead on Thu Aug 22, 2011  9:50 AM  ------       Message from: [place], [place] Danielle Small       Created: Wed Aug 21, 2011 11:52 AM       Regarding: [facility] TRIAGE       Contact: [phone]         CONFIRMED PHONE [phone]       CALLERS FIRST AND LAST NAME: Ladashia Demarinis [id number]                              REASON FOR REQUEST: triage              MESSAGE: Patient states that her injection on 06/04/11 at the pain clinic did not work, and she is experiencing increasing pain in her hip and lower back. She states that she will be working at [employer], and is inquiring what to do about her pain. 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 Please call and advise    Pt is 65 yo last seen 05/14/11 by Dr Sibyl Parr for f/u axial back pain with occ radiation down L thigh. She had excellent response to epidural steroid injections prior to her last injection. We feel it is reasonable to try one more injection to see if she can get improvement in her symptoms  Pt was referred to Sports and Spine for ESI, had Left L3 ESI 06/04/11 with Dr Bayard Beaver.  Pt is sched for f/u with Dr Sibyl Parr 10/15/11.    I called and spoke to pt who states she is still taking gabapentin usually 200 mg in AM and 400 mg at [facility], that her PCP Dr Leonel Ramsay in [facility] [phone] no longer wants to prescribe the Tyl #3 that she had been taking unless she is under a pain management plan. Pt states that the last 2 injections have not worked and she is now in so much pain constantly that it is hard to do anything.    Message routed to Dr Sibyl Parr to see if referral to Pain Clinic appropriate, will let pt know response.    ----- Original Message -----   From: Dola Argyle. Sibyl Parr   To: De Hollingshead RN2   Sent: Friday, September 06, 2011 11:42 AM   Subject: Re: Pain clinic referral?     Not at this time,  she has been given activity plan to work together with repeat serial injections  Sarina Ser    Pt advised to consult with Dr Sibyl Parr at 10/15/11 appt.

## 2011-10-15 ENCOUNTER — Ambulatory Visit: Payer: No Typology Code available for payment source | Attending: Orthopaedic Surgery | Admitting: Orthopaedic Surgery

## 2011-10-15 VITALS — BP 114/70 | HR 74 | Temp 97.4°F | Ht 61.0 in | Wt 160.0 lb

## 2011-10-15 DIAGNOSIS — IMO0002 Reserved for concepts with insufficient information to code with codable children: Secondary | ICD-10-CM | POA: Insufficient documentation

## 2011-10-15 DIAGNOSIS — Z981 Arthrodesis status: Secondary | ICD-10-CM | POA: Insufficient documentation

## 2011-10-15 LAB — PR RADEX SPINE LUMBOSACRAL 2/3 VIEWS

## 2011-10-15 NOTE — Progress Notes (Signed)
I have dictated note to referring providers into [facility].

## 2011-10-18 ENCOUNTER — Telehealth (HOSPITAL_BASED_OUTPATIENT_CLINIC_OR_DEPARTMENT_OTHER): Payer: Self-pay

## 2011-10-18 NOTE — Telephone Encounter (Addendum)
Message copied by Lorre Munroe on Fri Oct 18, 2011 10:31 AM  ------       Message from: Orlin Hilding T       Created: Fri Oct 18, 2011 10:11 AM       Regarding: kaufman/  ESI scheduling?       Contact: [phone]       [facility] RN,       This is regarding a patient of both Dr Sibyl Parr and Dr Carlos American.       Per discussion with pt this morning. Pt said Dr Sibyl Parr had wanted to her have a repeat L3 ESI left. Pt has had one already performed by Dr Carlos American in June 2012, which she said had no effect.       Pt was calling to set up a repeat ESI. Dr Sibyl Parr had given her a MRI order to do closer to home in the [employer] area at [facility]. Pt plans to do the MRI next Fri and will be bring the disk in and reports for Dr Carlos American.       I had printed the [facility] notes since Dr Sibyl Parr did not dictate in mindscape.       I don't see where it says to repeat the ESI with Dr Carlos American.       Please clarify.  Assessment and Plan: Dr. Esperanza Sheets note indicates pt is to have MRI, no referral or mention of ESI, however he did draft a letter  to obtain an appeal for ESI since the [facility] has  Refused one in the past.Pt has had two since that were paid for. Discussed with pt that once referral is available, staff will check with insurance and call her to set up time.

## 2011-10-21 NOTE — Telephone Encounter (Signed)
New ESI referral made and given to [facility], RN for Dr. Carlos American.

## 2011-10-21 NOTE — Telephone Encounter (Signed)
Called the patient and left message that she should see the spine fellow or MD Chapmen before proceeding with a ESI. The spine fellow or MD Sibyl Parr can review the results of the MRI before doing the [facility]. If you have any questions please call back.

## 2011-10-22 NOTE — Telephone Encounter (Signed)
Patient calls back and states can I see MD Sibyl Parr next week, or the spine fellow to go over the results of the MRI for me. I can not wait until the current appointment to see MD Sibyl Parr. I am going to have the MRI that he ordered for me this coming Friday and I will bring the disc for him. Can you please call me on my cell [phone]".    Advised the patient I will ask scheduling staff to see if we can see you next Tuesday. Patient was agreeable with the plan of care.

## 2011-10-22 NOTE — Telephone Encounter (Signed)
Called the patient and advised her to call back tomorrow to let us know where she can fit [place] in on the schedule for NOV 6, with the spine fellow to review the MRI will wait for the patient to call back.

## 2011-10-24 NOTE — Telephone Encounter (Signed)
Patient calls back and and states "I can see the spine fellow on November 05, 2011 at 10 am if this available still. I will bring the MRI disc and the report to the appointment with the fellow".    Advised the patient that the slot is still available with the spine fellow on November 05, 2011 at 10 am. I will get you on the scheduled and mail a reminder to your home. Patient was scheduled with the date/time mentioned and PSS will mail a reminder to her home.Closing the TE.

## 2011-11-05 ENCOUNTER — Encounter (HOSPITAL_BASED_OUTPATIENT_CLINIC_OR_DEPARTMENT_OTHER): Payer: Self-pay

## 2011-11-05 ENCOUNTER — Ambulatory Visit: Payer: No Typology Code available for payment source | Attending: Orthopaedic Surgery

## 2011-11-05 VITALS — BP 114/72 | HR 77 | Temp 98.3°F | Ht 61.5 in | Wt 160.0 lb

## 2011-11-05 DIAGNOSIS — Z981 Arthrodesis status: Secondary | ICD-10-CM | POA: Insufficient documentation

## 2011-11-05 DIAGNOSIS — M48061 Spinal stenosis, lumbar region without neurogenic claudication: Secondary | ICD-10-CM | POA: Insufficient documentation

## 2011-11-05 NOTE — Progress Notes (Signed)
HPI: Danielle Small is a 65 year old female who had been previously evaluated for lumbar neurogenic claudication. She previously underwent a L4-S1 posterior spinal intermittent confusion in 2004. She reports over a one-year history of progressive back and left thigh pain. She was referred for a MRI to evaluate for adjacent stenosis due to adjacent segment disease. Symptoms are unchanged. Pain is still 9/10. Pain is 50% leg and 50% back. Pain is described as a dull ache with flares of sharp stabbing pain. Pain will radiate down the lateral thigh to the level of the knee. She did walk a one block on a good day. She notes her last epidural steroid injection was of no benefit. Currently taking Tylenol with Codeine, Neurontin and soma for pain.  The patient returns for follow up after MRI.      Past Medical History   Diagnosis Date   . Generalized osteoarthrosis, unspecified site    . Acute peptic ulcer, unspecified site, with hemorrhage, without mention of obstruction    . Unspecified disorder of muscle, ligament, and fascia      right knee pain     Past Surgical History   Procedure Date   . Referral to sports and spine    . Neck/chest procedure unlisted C spine fusion      2003 by Dr. Sibyl Parr   . Spine surgery procedure unlisted L4/5 fusion      done in 1998 and revised later by Dr. Genevieve Norlander     Family History   Problem Relation Age of Onset   . Diabetes Other    . Cancer Other    . Other Other      rheumatoid arthritis     Social History     Occupational History   . Not on file.     Social History Main Topics   . Smoking status: Never Smoker    . Smokeless tobacco: Never Used   . Alcohol Use: No   . Drug Use: No   . Sexually Active: Not on file     Allergies: Penicillin v    She has a current medication list which includes the following prescription(s): acetaminophen-codeine, vitamin c, atenolol, butalbital-asa-caff-codeine, calcium carbonate, calcium-magnesium-vitamin d, carisoprodol, chlorphen  tan-phenyleph tan, cyclobenzaprine hcl, fenofibrate, iron, gabapentin, levocetirizine dihydrochloride, lisinopril, loratadine, lovastatin, metformin hcl, milk thistle, multiple vitamin, omeprazole, potassium gluconate, pyridoxine hcl, sertraline hcl, tramadol hcl, and vitamin e.    ROS: unchanged    Physical exam:    GENERAL:  Danielle Small is a 65 year old female in no apparent distress.     PSYCHIATRIC: The patient is alert and oriented.     PULMONARY: The patient has Nonlabored breathing    GI: The abdomen is nontender  SKIN: Exam is normal      Posture:  She stands erect with level shoulder and pelvis.    Spine Exam:     LUMBAR SPINE    VASCULAR: dorsalis pedis pulses are palpable bilaterally.  Capillary refill is < 3 seconds.   MUSCULOSKELETAL:  The patient has no pain with range of motion of the right and left hip  .   NEUROLOGICAL:     SENSATION RIGHT LEFT   Back normal  normal    Anterior medial thigh (L1, L2, L3) normal  normal    Anterior lateral thigh/medial calf (L4) normal  normal    Lateral thigh, lateral calf (L5) normal  normal    Posterior thigh, calf (S1) normal  normal              MOTOR STRENGTH RIGHT LEFT   Hip Flexion 5/5 5/5   Knee extension 5/5 5/5   Knee flexion  5/5 5/5   Dorsiflexion 5/5 5/5   EHL 5/5 5/5   Plantar flexion 5/5 5/5     REFLEXES     Knee  1+ 1+   Ankle  1+ 1+   Babinski absent absent   Clonus  0 beats 0 beats               Straight leg raise is negative on the right and left. Increased lumbar discomfort with femoral stretch Small on the left.     Gait is normal     Pt is able to heel walk bilaterally.  Pt is able to toe walk bilaterally.  Pt is able to tandem walk bilaterally           Imaging:    X-rays: demonstrate a well fused L4 to S1 segment. There is grade 1 anterolisthesis of L3 on 4. There is no motion on flexion-extension radiographs is of normal.    MRI scan: demonstrates a well fused L4-S1 segment. There is severe grade III stenosis at  L3-4.    Assessment:     Danielle Small is a 65 year old female  with the clinical presentation of lumbar neurogenic claudication.  She returns following her MRI. She has failed nonoperative measures. She is considered surgery after previous discussion with Dr. Sibyl Parr. She wishes to proceed with surgery sometime after the first of the year. She will be scheduled and he is clinic for further discussion. Surgery would be for her decompression at L3-4 with extension of the fusion for adjacent segment disease.

## 2011-11-26 ENCOUNTER — Ambulatory Visit: Payer: No Typology Code available for payment source | Attending: Orthopaedic Surgery | Admitting: Orthopaedic Surgery

## 2011-11-26 VITALS — BP 139/67 | HR 82

## 2011-11-26 DIAGNOSIS — M48062 Spinal stenosis, lumbar region with neurogenic claudication: Secondary | ICD-10-CM | POA: Insufficient documentation

## 2011-11-26 DIAGNOSIS — Z981 Arthrodesis status: Secondary | ICD-10-CM | POA: Insufficient documentation

## 2011-11-27 NOTE — Progress Notes (Signed)
HPI: Danielle Small is a 65 year old female who had been previously evaluated for lumbar neurogenic claudication. She previously underwent a L4-S1 PSIF in 2000. She was last seen in spine ortho clinic on 11/05/2011.  Previous MRI showed significant L3-L4 central canal stenosis and 09/2011 lumbar films showed stable grade I anterolisthesis of L3 on 4 with stable hardware. She continues to report continued low back pain and intermittent left buttock and lateral thigh pain.  She continues to report pain, aching and dull with intermittent flares of sharp stabbing pain, 8-9/10 in intensity.  She states that the pain does not go beyond the knee.  Still taking Tylenol with Codeine (1-1.5 tabs/day), gabapentin (5 tabs/day), Flexeril (2 tabs/day), Soma (2 tabs/day). + shopping cart sign. Pain is 50% leg and 50% back. Last ESI was in 05/2011 by Dr. Carlos American with only 2 weeks of reported improvement.  She states that previously she would get up to 6 months of relief but that repeat injections over the past year have only given short relief.  She has been disappointed with the diminishing duration of relief of her epidural injections and would like to consider spine surgery options.     Past Medical History   Diagnosis Date   . Generalized osteoarthrosis, unspecified site    . Acute peptic ulcer, unspecified site, with hemorrhage, without mention of obstruction    . Unspecified disorder of muscle, ligament, and fascia      right knee pain     Past Surgical History   Procedure Date   . Referral to sports and spine    . Neck/chest procedure unlisted C spine fusion      2003 by Dr. Sibyl Parr   . Spine surgery procedure unlisted L4/5 fusion      done in 1998 and revised later by Dr. Genevieve Norlander     Family History   Problem Relation Age of Onset   . Diabetes Other    . Cancer Other    . Other Other      rheumatoid arthritis     Social History     Occupational History   . Not on file.     Social History Main Topics   . Smoking  status: Never Smoker    . Smokeless tobacco: Never Used   . Alcohol Use: No   . Drug Use: No   . Sexually Active: Not on file     Allergies: Penicillin v    She has a current medication list which includes the following prescription(s): acetaminophen-codeine, vitamin c, atenolol, butalbital-asa-caff-codeine, calcium carbonate, calcium-magnesium-vitamin d, carisoprodol, chlorphen tan-phenyleph tan, cyclobenzaprine hcl, fenofibrate, iron, gabapentin, levocetirizine dihydrochloride, lisinopril, loratadine, lovastatin, metformin hcl, milk thistle, multiple vitamin, omeprazole, potassium gluconate, pyridoxine hcl, sertraline hcl, tramadol hcl, and vitamin e.    ROS: unchanged  Physical exam:   GENERAL: Danielle Small is a 65 year old female in no apparent distress.   PSYCHIATRIC: The patient is alert and oriented.   PULMONARY: The patient has Nonlabored breathing   GI: The abdomen is nontender   SKIN: Exam is normal   Posture: She stands erect with level shoulder and pelvis.   Spine Exam:   LUMBAR SPINE   VASCULAR: dorsalis pedis pulses are palpable bilaterally. Capillary refill is < 3 seconds.   MUSCULOSKELETAL: Diffuse TTP over lumbar spine paraspinals from L3 to L5 bilaterally L>R. +TTP over lateral buttocks b/l   NEUROLOGICAL:   SENSATION  RIGHT  LEFT    Back  normal  normal    Anterior medial thigh (L1, L2, L3)  normal  normal    Anterior lateral thigh/medial calf (L4)  normal  normal    Lateral thigh, lateral calf (L5)  normal  normal    Posterior thigh, calf (S1)  normal  normal      MOTOR STRENGTH  RIGHT  LEFT    Hip Flexion  5/5  5/5    Knee extension  5/5  5/5    Knee flexion  5/5  5/5    Dorsiflexion  5/5  5/5    EHL  5/5  5/5    Plantar flexion  5/5  5/5      REFLEXES      Knee  1+  1+    Ankle  1+  1+    Babinski  absent  absent    Clonus  0 beats  0 beats    Straight leg raise is negative on the right and left. Increased lumbar discomfort with femoral stretch test on the left compared to  right.   Gait is normal   Pt is able to heel walk bilaterally.   Pt is able to toe walk bilaterally.   Pt is able to tandem walk bilaterally     Imaging:   X-rays: demonstrate a well fused L4 to S1 segment. There is grade 1 anterolisthesis of L3 on L4. There is no motion on flexion-extension radiographs is of normal.   MRI scan: demonstrates a well fused L4-S1 segment. There is severe grade III stenosis at L3-4.     Assessment:   Danielle Small is a 65 year old female with a h/o a L4-5 PSIF in 2000, with significant adjacent segment disease at L4, and a clinical presentation of lumbar neurogenic claudication.  She has had limited benefit from recent nonoperative measures and would like to consider surgical options at this time. Based on imaging and overall clinical  Picture, she would be an appropriate candidate for decompression and fusion at the L3-L4 level.  She wishes to proceed with surgery sometime after the first of the next year (early 2013).  Risks and benefits were discussed in detail with the patient. We encouraged her to remain active and perform a home exercise program prior to surgery.  We also discussed that she may try a repeat epidural injection in the interim time prior to her surgery in 2013.  She remains interested possible interim relief with a repeat steroid injection to help with pain, and thus are referring her back to Dr. Carlos American for a repeat ESI.  She should call Dr. Esperanza Sheets coordinator when she is ready to be scheduled for surgery. Information packet/booklet on what to expect from spine surgery was given to the patient on this visit.    Dr. Sibyl Parr spent at least 15 minutes with the patient of which at least half the time was dedicated to counseling and discussion of treatment options.

## 2011-11-27 NOTE — Progress Notes (Signed)
I have seen and examined the patient together with Dr. Hsu and I agree with hs note.

## 2011-11-28 ENCOUNTER — Telehealth (HOSPITAL_BASED_OUTPATIENT_CLINIC_OR_DEPARTMENT_OTHER): Payer: Self-pay | Admitting: Orthopaedic Surgery

## 2011-11-28 NOTE — Telephone Encounter (Signed)
Biddie is going to have another injection with Dr. Carlos American and will call after that to schedule surgery.

## 2011-12-03 ENCOUNTER — Encounter (HOSPITAL_BASED_OUTPATIENT_CLINIC_OR_DEPARTMENT_OTHER): Payer: No Typology Code available for payment source | Admitting: Orthopaedic Surgery

## 2011-12-03 ENCOUNTER — Telehealth (HOSPITAL_BASED_OUTPATIENT_CLINIC_OR_DEPARTMENT_OTHER): Payer: Self-pay | Admitting: Physical Medicine & Rehabilitation

## 2011-12-03 NOTE — Telephone Encounter (Signed)
Called the patient to complete the [facility] teaching    SPORTS & SPINE  INJECTION INSTRUCTIONS      PATIENT: Danielle Small    [id number]  Date of Procedure:  12/10/11  Provider performing procedure:  Bayard Beaver, MD  Patient to follow-up with:  MD Sibyl Parr  Procedure to be performed (including side):  MD Carlos American to determine    Diagnostic Procedure Only:  NO  REQUIRED IMAGING IN PACS:  yes   Patient reminded to bring MRI to appointment if completed outside the [facility] system:  no    PRE-INJ GENERAL INSTRUCTIONS:    Patient instructed to check in at the [facility], located NEXT DOOR on the 4th floor at the [facility] ([address] [place], [place], [place] [address]):  yes   Patient told to eat a light breakfast/lunch, but NPO 1 hr prior to procedure:  yes   Patient instructed on ride home:  yes   Patient instructed to call the [facility] @ [phone] and speak to the nurse:   1. If experincing any cold symptoms such fevers, chills, night sweating or coughing:  yes  2. If taking any antibiotics or being treated for an infection:  yes  3. If she has any additional questions BEFORE the INJ: yes  4. For day of the procedure, patient instructed to call the [facility] @ [phone]:  yes   Patient advised to take all medications as usually taken throughout the day (including blood pressure medications and diabetes medications), with the exception of any blood-thinning medications.  PATIENT ADVISED TO DISCONTINUE ANTI-COAG MEDS (**Please see List Below**):  Herbals/Vitamin E/MVI-- stop for 7 days, a    **ANTI-COAG/MED GUIDELINES TO REVIEW WITH PATIENT BEFORE INJECTION PROCEDURES**  AXIAL INJECTIONS:   NSAIDs:  do not need to hold   ASA:  o < 300mg  ~ do not need to hold  o >/= 300mg  ~ hold for 7days   Clopidrogel (Plavix) ~ hold for 7days   Ticlopidine (Ticlid) ~ hold for 14days   Warfarin (Coumadin) ~ hold for 5days, check rapid INR on that day,   o OK to proceed if INR </=  1.5   Aggrenox (ASA and Dipyridamole) ~ hold 7days   Dipyridamole (Persantine) ~ hold 7days   Tirofiban (Aggrastat) ~ hold 2days   Abciximab (ReoPro) ~ hold 2days   Eptifibatide (Integrilin) ~ hold 2days   Pletal and Trental  ~ hold for 2days   Orgaran ~ hold for 5days   Heparin, Lovenox, Innohep, Fragmin, Normiflo ~ hold for 24 hours   Vitamin E (greater than 400 IU daily) ~ hold for 7days   All herbals ~ hold for 7days  INTRA-ARTICULAR HIP/SHOULDER/KNEE, BURSAL/TENDON INJECTIONS:  Per discretion of provider   **OK to restart all medications evening following procedure.    ALL MEDICATION ALLERGIES REVIEWED BY:  Rodman Pickle on 12/03/2011    ADDITIONAL INFORMATION (If "YES" to any questions, notify RN or MD immediately):   Contrast / Iodine / Shellfish Allergy?  NO  1. Pre-Medication Ordered:  NO  2. IV: NO   Pre-Procedure STAT Rapid INR Needed?  NO  1. STAT Rapid INR Ordered:  NO   Patient is diabetic?  Yes -- Patient instructed on how to manage hyperglycemia  Patient is/may be pregnant?  No    POST-INJ GENERAL INSTRUCTIONS:    PATIENT INSTRUCTED TO ICE THE INJ SITE 10-15 minutes approximately 3-4 times a day on the day  of the INJ, and 10-15 minutes 3-4 times on the day following the INJ to assist with local discomfort:  yes     PATIENT INSTRUCTED TO CALL THE [facility] CLINIC immediately if she develops severe pain, redness, warmth, or swelling around the INJ site, shaking, chills, or fevers greater than 100F.:  yes     PATIENT INSTRUCTED THAT ACTIVITY SHOULD BE LIMITED on the day of the INJ and physical therapy and other exercises should be delayed for three (3) days following the INJ.  However, return to usual daily activities is expected the day after the INJ.  Typically, if the patient is working, she can expect to return to work the day after the INJ:   yes     IF PATIENT NEEDS ADDITIONAL MEDICATION, if required, it should be obtained from her referring physician:  yes     PATIENT  INSTRUCTED THAT IF she HAS ANY CONCERNS, INCLUDING BUT NOT LIMITED TO, worsening pain, numbness, leg weakness or significant bladder/bowel changes, please call our office immediately at [phone] and ask to speak to our Nurse:  yes     PATIENT TO FOLLOW-UP instructed:  yes      All of the patient's questions were answered.    Rodman Pickle  [facility]

## 2011-12-10 ENCOUNTER — Ambulatory Visit (HOSPITAL_BASED_OUTPATIENT_CLINIC_OR_DEPARTMENT_OTHER): Payer: No Typology Code available for payment source | Admitting: Physical Medicine & Rehabilitation

## 2011-12-10 ENCOUNTER — Ambulatory Visit
Payer: No Typology Code available for payment source | Attending: Physical Medicine & Rehabilitation | Admitting: Physical Medicine & Rehabilitation

## 2011-12-10 DIAGNOSIS — M51379 Other intervertebral disc degeneration, lumbosacral region without mention of lumbar back pain or lower extremity pain: Secondary | ICD-10-CM | POA: Insufficient documentation

## 2011-12-10 DIAGNOSIS — M545 Low back pain, unspecified: Secondary | ICD-10-CM | POA: Insufficient documentation

## 2011-12-10 DIAGNOSIS — IMO0002 Reserved for concepts with insufficient information to code with codable children: Secondary | ICD-10-CM | POA: Insufficient documentation

## 2011-12-10 NOTE — Progress Notes (Signed)
[facility]       [address]  [id number]  [place], [place]  [address]  TEL: [phone]  l  FAX: [phone]    Specializing in sports-related injuries and non-surgical care for conditions of the spine,  shoulder, elbow, wrist, hand, hip, knee, foot, and ankle.    12/10/2011    Danielle Small Danielle Small  [id number]    PROCEDURE:  Fluoroscopic guided caudal injection    DIAGNOSIS/INDICATION:  L>R low back pain, radiation into LLE, s/p L4-S1 fusion. Has had previous injections, last 2 only lasted 1-2 weeks.  MRI dated 10/25/11 reviewed: relatively unchanged from 09/2009. Ongoing multilevel DDD. We discussed at length repeating TFESI vs caudal ESI. We agreed to proceed w/ caudal ESI.  See clinic notes for additional information.  Allergies: is allergic to penicillin v.  Allergies were reviewed with the patient. No contraindications were identified.     CONSENT:  Potential risks including pain, serious infection, paralysis,nerve injury, spinal headache, allergic reaction, and others were discussed with the patient. The patient was given and read a patient information sheet regarding injection procedures prior to today's injection. The patient understands the benefits,risks, and alternatives of the procedure and agrees to the procedure today. Written informed consent was obtained.     TECHNIQUE:  Patient was escorted to the fluoroscopy room and positioned prone on the procedure table. A brief pause occurred prior to procedure in which final verification was performed. The region overlying the sacral hiatus was identified.The skin was prepped and draped in the usual sterile fashion using Betadine. The skin and soft tissues were anesthetized using 1% lidocaine. A 22 gauge, 3  inch spinal needle was then introduced into the sacral canal via the sacral hiatus using intermittent-fluoroscopic guidance. Precise needle placement was confirmed by injection of Omnipaque (iohexol)  contrast dye using live fluoroscopy in both lateral and AP views.  A normal epidural pattern of dye was seen without evidence of vascular uptake.  A total of 10cc containing 2cc of 1% lidocaine, 6cc of NaCl, and 2cc of Celestone (betamethasone 6mg /mL) was then injected into the epidural space. The needle was then flushed with 1% lidocaine and removed, the skin was cleansed, and gauze was placed over the injection site. The patient tolerated the procedure well. The patient was escorted back to the post-procedure recovery room and remained stable, with good vital signs both pre- and post-procedure. Symptoms were 7-8/10 initially and 1-2/10 during the anesthetic phase following the procedure.   COMPLICATIONS:  None    POST-INJECTION PLAN:  1. The patient was given instructions regarding the use of ice and appropriate activity level over the next few weeks. Patient was instructed to call the office immediately if there are any questions or problems.    2. The patient will follow up w/ Dr Sibyl Parr in 2-3 weeks.    Jolaine Click, MD  Attending/[facility]    CC Dr Sibyl Parr   CC Harlan Stains

## 2011-12-12 ENCOUNTER — Telehealth (HOSPITAL_BASED_OUTPATIENT_CLINIC_OR_DEPARTMENT_OTHER): Payer: Self-pay | Admitting: Physical Medicine & Rehabilitation

## 2011-12-12 NOTE — Telephone Encounter (Signed)
Patient received a Fluoroscopic guided caudal injection last Tuesday.    Called the patient and she states "I am doing really good today. I have no pain, and have no HA. The 1st day I had a bad HA and the pain was still there, then the next day the HA and the pain just went away. I had no complications from the injection such as the temperature, infection or other symptoms. I have a follow up with MD Sibyl Parr on 01/21/12. I will follow up with him".    Advised the patient I will give MD Carlos American teh message, and will see you in the clinic next month, and if things come up we will wait for your call. Patient was agreeable with this plan. Closing the TE.     Jamison Neighbor RN  [facility]

## 2011-12-20 ENCOUNTER — Telehealth (HOSPITAL_BASED_OUTPATIENT_CLINIC_OR_DEPARTMENT_OTHER): Payer: Self-pay | Admitting: Physical Medicine & Rehabilitation

## 2011-12-20 NOTE — Telephone Encounter (Signed)
Patient calling to follow-up post injection ( 12/10/11). Pt states she is doing great. Patient would like to discuss this with [facility]. Please call pt back on home phone  [phone]    Danielle Small Grandchild, PSS  [facility]  [facility]

## 2011-12-20 NOTE — Telephone Encounter (Signed)
Called the patient and she states "please tell MD Carlos American, I am doing good from this injection I think I should get through Christmas, and New Years, tell her thanks again and have a good holiday".    Advised the patient I will give the message to MD Carlos American and have a good Holiday. Please call us back if things come up and MD Sibyl Parr will see you on 01/20/11 at 2:15. She was agreeable with the plan closing the TE.     Jamison Neighbor RN  [facility]  [facility]

## 2012-01-21 ENCOUNTER — Ambulatory Visit: Payer: No Typology Code available for payment source | Attending: Orthopaedic Surgery | Admitting: Orthopaedic Surgery

## 2012-01-21 VITALS — BP 142/83 | HR 75 | Temp 97.8°F | Ht 61.0 in | Wt 158.0 lb

## 2012-01-21 DIAGNOSIS — M47817 Spondylosis without myelopathy or radiculopathy, lumbosacral region: Secondary | ICD-10-CM | POA: Insufficient documentation

## 2012-01-21 DIAGNOSIS — Z981 Arthrodesis status: Secondary | ICD-10-CM | POA: Insufficient documentation

## 2012-01-21 NOTE — Progress Notes (Signed)
I have personally seen and examined the patient today and I have dictated note to referring provider into [facility].

## 2012-05-29 ENCOUNTER — Encounter: Payer: Self-pay | Admitting: Gastroenterology

## 2012-06-23 ENCOUNTER — Encounter (HOSPITAL_BASED_OUTPATIENT_CLINIC_OR_DEPARTMENT_OTHER): Payer: Self-pay | Admitting: Orthopaedic Surgery

## 2012-06-23 ENCOUNTER — Ambulatory Visit: Payer: No Typology Code available for payment source | Attending: Orthopaedic Surgery | Admitting: Orthopaedic Surgery

## 2012-06-23 VITALS — BP 137/88 | HR 81 | Temp 98.1°F | Ht 61.5 in | Wt 160.0 lb

## 2012-06-23 DIAGNOSIS — M47817 Spondylosis without myelopathy or radiculopathy, lumbosacral region: Secondary | ICD-10-CM | POA: Insufficient documentation

## 2012-06-23 NOTE — Addendum Note (Signed)
Addended by: Leotis Shames on: 06/23/2012 06:00 PM     Modules accepted: Orders

## 2012-06-23 NOTE — Progress Notes (Signed)
HPI: Sharissa Brierley Rolande Moe is a 66 year old female who had been previously evaluated for return of symptoms following L4-S1 decompression and fusion for stenosis and spondylolisthesis in 2000.  Over the past 2 years she has developed left leg pain that she rates as a 4/10 in severity and is sharp and stabbing in nature.  The pain is worse with sitting, walking, standing and lifting and improved with rest ice and NSAIDs.  She underwent a Caudal ESI by Dr. Bayard Beaver on 12/10/11 and reports significant improvement of her symptoms.  They have just begun to return and she would like to consider a repeat injection.    The patient also successfully underwent right knee replacement on 03/23/12 by Dr. Maurice March at [facility] and reports she is doing "terrific".  She is almost finished with her PT and reports she is now ambulating 1-2 blocks without an assistive device.      Past Medical History   Diagnosis Date   . Generalized osteoarthrosis, unspecified site    . Acute peptic ulcer, unspecified site, with hemorrhage, without mention of obstruction    . Unspecified disorder of muscle, ligament, and fascia      right knee pain     Past Surgical History   Procedure Date   . Referral to sports and spine    . Neck/chest procedure unlisted C spine fusion      2003 by Dr. Sibyl Parr   . Spine surgery procedure unlisted L4/5 fusion      done in 1998 and revised later by Dr. Genevieve Norlander     Family History   Problem Relation Age of Onset   . Diabetes Other    . Cancer Other    . Other Other      rheumatoid arthritis     Social History     Occupational History   . Not on file.     Social History Main Topics   . Smoking status: Never Smoker    . Smokeless tobacco: Never Used   . Alcohol Use: No   . Drug Use: No   . Sexually Active: Not on file     Allergies: Penicillin v    She has a current medication list which includes the following prescription(s): acetaminophen-codeine, vitamin c, atenolol,  butalbital-asa-caff-codeine, calcium carbonate, calcium-magnesium-vitamin d, carisoprodol, chlorphen tan-phenyleph tan, cyclobenzaprine hcl, fenofibrate, iron, gabapentin, levocetirizine dihydrochloride, lisinopril, loratadine, lovastatin, metformin hcl, milk thistle, multiple vitamin, omeprazole, potassium gluconate, pyridoxine hcl, sertraline hcl, tramadol hcl, vitamin e, and warfarin sodium.    ROS: unchanged    Physical exam:    GENERAL:  Nikita Surman is a 66 year old female in no apparent distress.     PSYCHIATRIC: The patient is alert and oriented.     PULMONARY: The patient has Nonlabored breathing    GI: The abdomen is nontender  SKIN: Exam is normal      Posture:  Slightly protracted shoulders, otherwise upright posture    Spine Exam:     LUMBAR SPINE    VASCULAR: posterior tibialis pulses are palpable bilaterally.  Capillary refill is < 3 seconds.   MUSCULOSKELETAL:  The patient has no pain with range of motion of the lumbar spine in flexion or extension.  NEUROLOGICAL:     SENSATION RIGHT LEFT   Back normal  normal    Anterior medial thigh (L1, L2, L3) normal  normal    Anterior lateral thigh/medial calf (L4) normal  normal  Lateral thigh, lateral calf (L5) normal  normal    Posterior thigh, calf (S1) normal  normal              MOTOR STRENGTH RIGHT LEFT   Hip Flexion 5/5 5/5   Knee extension 5/5 5/5   Knee flexion  5/5 5/5   Dorsiflexion 5/5 5/5   EHL 5/5 5/5   Plantar flexion 5/5 5/5     REFLEXES     Knee  2+ 2+   Ankle  1+ 1+   Babinski absent absent   Clonus  0 beats 0 beats               Straight leg raise is negative on the right and left     Gait is mildly antalgic       Knee incision is healing well.  Lumbar spine incision is well-healed.      Assessment:     Kaniyah Lisby Prudie Guthridge is a 66 year old female with a h/o a L4-S1 PSIF in 2000, with lumbar neurogenic claudication.  She has had excellent relief with her most recent caudal ESI by Dr. Carlos American in 11/2011 and would  like to undergo repeat injection as her symptoms have begun to return.  We counseled that she should schedule this appointment.  We also encouraged her to remain active and focus on back and buttock strengthening, hamstring and quadriceps stretching and overall posture.   She will follow up in clinic in 1 year.

## 2012-06-24 ENCOUNTER — Telehealth (HOSPITAL_BASED_OUTPATIENT_CLINIC_OR_DEPARTMENT_OTHER): Payer: Self-pay | Admitting: Physical Medicine & Rehabilitation

## 2012-06-24 NOTE — Telephone Encounter (Signed)
SPORTS & SPINE  INJECTION INSTRUCTIONS      PATIENT: Danielle Small    [id number]  Date of Procedure:   Provider performing procedure:  Bayard Beaver, MD  Patient to follow-up with: MD Sibyl Parr   Procedure to be performed (including side): Midline caudal    Diagnostic Procedure Only:  NO  REQUIRED IMAGING IN PACS:  yes   Patient reminded to bring MRI to appointment if completed outside the [facility] system:  no    PRE-INJ GENERAL INSTRUCTIONS:    Patient instructed to check in at the [facility], located NEXT DOOR on the 4th floor at the [facility] ([address] [place], [place], [place] [address]):  yes   Patient told to eat a light breakfast/lunch, but NPO 1 hr prior to procedure:  yes   Patient instructed on ride home:  yes   Patient instructed to call the [facility] @ [phone] and speak to the nurse:   1. If experincing any cold symptoms such fevers, chills, night sweating or coughing:  yes  2. If taking any antibiotics or being treated for an infection:  yes  3. If she has any additional questions BEFORE the INJ: yes  4. For day of the procedure, patient instructed to call the [facility] @ [phone]:  yes  Patient advised to take all medications as usually taken throughout the day (including blood pressure medications and diabetes medications), with the exception of any blood-thinning medications.  PATIENT ADVISED TO DISCONTINUE ANTI-COAG MEDS (**Please see List Below**):  Coumadin -- stop for (to be determined by her orthopedic surgeon who she will see on 07/07/12 at [facility]) days, Herbals/Vitamin E/MVI-- stop for 7 days before    **ANTI-COAG/MED GUIDELINES TO REVIEW WITH PATIENT BEFORE INJECTION PROCEDURES**  AXIAL INJECTIONS:   NSAIDs:  do not need to hold   ASA:  o < 300mg  ~ do not need to hold  o >/= 300mg  ~ hold for 7days   Clopidrogel (Plavix) ~ hold for 7days   Ticlopidine (Ticlid) ~ hold for 14days   Warfarin (Coumadin) ~ hold for 5days,  check rapid INR on that day,   o OK to proceed if INR </= 1.5   Aggrenox (ASA and Dipyridamole) ~ hold 7days   Dipyridamole (Persantine) ~ hold 7days   Tirofiban (Aggrastat) ~ hold 2days   Abciximab (ReoPro) ~ hold 2days   Eptifibatide (Integrilin) ~ hold 2days   Pletal and Trental  ~ hold for 2days   Orgaran ~ hold for 5days   Heparin, Lovenox, Innohep, Fragmin, Normiflo ~ hold for 24 hours   Vitamin E (greater than 400 IU daily) ~ hold for 7days   All herbals ~ hold for 7days  INTRA-ARTICULAR HIP/SHOULDER/KNEE, BURSAL/TENDON INJECTIONS:  Per discretion of provider   **OK to restart all medications evening following procedure.    ALL MEDICATION ALLERGIES REVIEWED BY:  Rodman Pickle on 06/24/2012    ADDITIONAL INFORMATION (If "YES" to any questions, notify RN or MD immediately):   Contrast / Iodine / Shellfish Allergy?  NO  1. Pre-Medication Ordered:  NO  2. IV: NO   Pre-Procedure STAT Rapid INR Needed?  NO  1. STAT Rapid INR Ordered:  NO   Patient is diabetic?  Yes -- Patient instructed on how to manage hyperglycemia  Patient is/may be pregnant?  No    POST-INJ GENERAL INSTRUCTIONS:    PATIENT INSTRUCTED TO ICE THE INJ SITE 10-15 minutes approximately 3-4  times a day on the day of the INJ, and 10-15 minutes 3-4 times on the day following the INJ to assist with local discomfort:  yes     PATIENT INSTRUCTED TO CALL THE [facility]SP CLINIC immediately if she develops severe pain, redness, warmth, or swelling around the INJ site, shaking, chills, or fevers greater than 100F.:  yes     PATIENT INSTRUCTED THAT ACTIVITY SHOULD BE LIMITED on the day of the INJ and physical therapy and other exercises should be delayed for three (3) days following the INJ.  However, return to usual daily activities is expected the day after the INJ.  Typically, if the patient is working, she can expect to return to work the day after the INJ:   yes     IF PATIENT NEEDS ADDITIONAL MEDICATION, if required, it should be obtained  from her referring physician:  yes     PATIENT INSTRUCTED THAT IF she HAS ANY CONCERNS, INCLUDING BUT NOT LIMITED TO, worsening pain, numbness, leg weakness or significant bladder/bowel changes, please call our office immediately at [phone] and ask to speak to our Nurse:  yes     PATIENT TO FOLLOW-UP instructed:  yes      All of the patient's questions were answered.    Rodman Pickle  [facility]

## 2012-06-29 NOTE — Telephone Encounter (Signed)
Called the patient and she states "the surgeon wants me to stay on the coumadin for 3 months he was the ordering provider. I talked with my PCP and he suggested that I could be off this medication for 3-5 days and that should not affect me. I have to take this medication for 3 months total and I should be done by September. I take coumadin 5 mg Mon, Wed,Thursday, and all other days I take 2.5mg . Please tell MD Carlos American I do not get the bruising from bumping into walls or scratching when I have touched black berry bushes".     Advised her I need to ask MD Carlos American what her decision will be more than like we need to get the surgeon permission. I will call you back next week.     Jamison Neighbor RN  [facility]  [facility]

## 2012-06-29 NOTE — Telephone Encounter (Signed)
Patient is calling to see how many days she has to be off warfarin before injection. Please call her back at [phone].    Tomasa Blase, PSS  [facility]  [facility]

## 2012-07-08 ENCOUNTER — Telehealth (HOSPITAL_BASED_OUTPATIENT_CLINIC_OR_DEPARTMENT_OTHER): Payer: Self-pay | Admitting: Physical Medicine & Rehabilitation

## 2012-07-08 NOTE — Telephone Encounter (Signed)
Attempted to contact pt via phone re: warfarin/ESI.   Left message for pt to call back.   I am not comfortable with performing ESI while she is on warfarin. Per chart notes, she had appt w/ Ortho yesterday to discuss timeline for this medication.   Await pt's return call.

## 2012-07-08 NOTE — Telephone Encounter (Signed)
MD Carlos American called the patient and left message (see below).    Danielle Small 07/08/2012 10:24 AM Signed   Attempted to contact pt via phone re: warfarin/ESI.   Left message for pt to call back.   I am not comfortable with performing ESI while she is on warfarin. Per chart notes, she had appt w/ Ortho yesterday to discuss timeline for this medication.   Await pt's return call.

## 2012-07-09 NOTE — Telephone Encounter (Signed)
Patient calls back and states I received MD Kaufman's message and she can call me on my Home [phone]. I saw the surgeon this week and wants me to stay of the coumadin until September 8th or 12th where they will do a doppler".    Advised her I will give this message to MD Carlos American. She was agreeable with this.    Jamison Neighbor RN  [facility]  [facility]

## 2012-07-09 NOTE — Telephone Encounter (Signed)
Attempted to return pt's call at # provided by RN. Unable to leave message.   Since she is unable to hold warfarin, we will wait until this medication has been d/c'ed to move forward w/ ESI.

## 2012-07-10 NOTE — Telephone Encounter (Signed)
Called the patient and left message for her as stated below by MD Carlos American. Will wait for her to call back. Closing the TE.    Jamison Neighbor RN  [facility]  [facility]

## 2012-07-14 ENCOUNTER — Ambulatory Visit (HOSPITAL_BASED_OUTPATIENT_CLINIC_OR_DEPARTMENT_OTHER): Payer: No Typology Code available for payment source | Admitting: Physical Medicine & Rehabilitation

## 2012-07-16 NOTE — Telephone Encounter (Signed)
Patient has not called back. Closing the TE.    Shazad E Dean RN  [facility]  [facility]

## 2012-08-25 ENCOUNTER — Ambulatory Visit (HOSPITAL_BASED_OUTPATIENT_CLINIC_OR_DEPARTMENT_OTHER): Payer: Medicare Other | Admitting: Physical Medicine & Rehabilitation

## 2012-08-25 ENCOUNTER — Ambulatory Visit: Payer: Medicare Other | Attending: Physical Medicine & Rehabilitation | Admitting: Physical Medicine & Rehabilitation

## 2012-08-25 ENCOUNTER — Telehealth (HOSPITAL_BASED_OUTPATIENT_CLINIC_OR_DEPARTMENT_OTHER): Payer: Self-pay | Admitting: Physical Medicine & Rehabilitation

## 2012-08-25 DIAGNOSIS — IMO0002 Reserved for concepts with insufficient information to code with codable children: Secondary | ICD-10-CM | POA: Insufficient documentation

## 2012-08-25 DIAGNOSIS — M545 Low back pain, unspecified: Secondary | ICD-10-CM | POA: Insufficient documentation

## 2012-08-25 DIAGNOSIS — M51379 Other intervertebral disc degeneration, lumbosacral region without mention of lumbar back pain or lower extremity pain: Secondary | ICD-10-CM | POA: Insufficient documentation

## 2012-08-25 NOTE — Telephone Encounter (Signed)
Message copied by Rodman Pickle on Tue Aug 25, 2012  1:32 PM  ------       Message from: Lysbeth Penner       Created: Tue Aug 25, 2012 11:33 AM         Hi       i was talking to Janella & since she's not planning to have surgery in near future, can you ask if dr Sibyl Parr wants to send me a consult to manage for now?        Thanks,       msk

## 2012-08-25 NOTE — Progress Notes (Signed)
[facility]       [address]  [id number]  [place], [place]  [address]  TEL: [phone]  l  FAX: [phone]    Specializing in sports-related injuries and non-surgical care for conditions of the spine,  shoulder, elbow, wrist, hand, hip, knee, foot, and ankle.    08/25/2012    Danielle Small  [id number]    PROCEDURE:  Fluoroscopic guided caudal injection    DIAGNOSIS/INDICATION:  66 year old woman with history of L4-S1 fusion and multilevel spinal stenosis. Referred by Dr Sibyl Parr for repeat injection as she had such good relief s/p previous caudal ESI.   At the time she was on warfarin for DVT post-TKA. Warfarin was d/c'ed 2 weeks ago. Currently, she is experiencing L>R low back pain, radiation into LLE. Has had a number of previous injections, most recently caudal ESI performed 12/10/11, which provided at least 6 months of relief.  Prior injections involving L3 TF ESI only lasted 1-2 weeks. Today, with grossly full strength and normal sensation in the BLE.  MRI dated 10/25/11 reviewed: relatively unchanged from 09/2009. Ongoing multilevel DDD. See clinic notes for additional information.We agreed to proceed w/ caudal ESI.    Allergies: is allergic to penicillin v.  Allergies were reviewed with the patient. No contraindications were identified.     Prior injection history:  12/12/09 - L3 TF ESI (Left)  04/10/10 - L3 TF ESI (Left) - 1-2 months relief (took longer to "kick in" than first one)  10/09/10 - L3 TF ESI (Left) - Approx 2 weeks relief  06/04/11 - L3 TF ESI (Left) - Approx 2 weeks relief  12/10/11 - Caudal ESI - Approx 6 months relief    CONSENT:  Potential risks including pain, serious infection, paralysis,nerve injury, spinal headache, allergic reaction, and others were discussed with the patient. The patient was given and read a patient information sheet regarding injection procedures prior to today's injection. The patient understands the benefits,risks,  and alternatives of the procedure and agrees to the procedure today. Written informed consent was obtained.     TECHNIQUE:  Patient was escorted to the fluoroscopy room and positioned prone on the procedure table. A brief pause occurred prior to procedure in which final verification was performed. The region overlying the sacral hiatus was identified.The skin was prepped and draped in the usual sterile fashion using Betadine. The skin and soft tissues were anesthetized using 1% lidocaine. A 22 gauge, 3  inch spinal needle was then introduced into the sacral canal via the sacral hiatus using intermittent-fluoroscopic guidance. Precise needle placement was confirmed by injection of Omnipaque (iohexol) contrast dye using live fluoroscopy in both lateral and AP views, normal epidural pattern of dye was seen without evidence of vascular uptake.  A total of 10cc containing 2cc of 1% lidocaine, 6cc of NaCl, and 2cc of Celestone (betamethasone 6mg /mL) was then injected into the epidural space. The needle was then flushed with 1% lidocaine and removed, the skin was cleansed, and gauze was placed over the injection site. The patient tolerated the procedure well.     The patient was escorted back to the post-procedure recovery room and remained stable, with good vital signs both pre- and post-procedure. Symptoms were 7/10 initially and 0/10 during the anesthetic phase following the procedure.     COMPLICATIONS:  None    POST-INJECTION PLAN:  1. The patient was given instructions regarding the use of ice  and appropriate activity level over the next few weeks. Patient was instructed to call the office immediately if there are any questions or problems.  2. The patient will follow up w/ Dr Sibyl Parr in 2-3 weeks or as instructed.    Judy Pimple, MD  Sports Medicine Fellow  Department of Rehabilitation Medicine    Attending addendum:    Procedure was completed w/ the assistance of Dr. Judy Pimple, Fellow.  I was present  for and assisted with the entire procedure.     Jolaine Click, MD  Attending/[facility]    CC Dr Sibyl Parr

## 2012-08-25 NOTE — Telephone Encounter (Signed)
Patient is a 66 years old female with a history of L4-S1 decompression and fusion for stenosis and spondylolisthesis in 2000. Patient was seen last on 06/23/12 with MD Sibyl Parr and had aESI 08/25/12 with MD Carlos American.    Will ask spine team if they can make a referral for the patient to be seen by MD Carlos American if they are not planing to do surgery.    Jamison Neighbor RN  [facility]  [facility]

## 2012-08-27 ENCOUNTER — Telehealth (HOSPITAL_BASED_OUTPATIENT_CLINIC_OR_DEPARTMENT_OTHER): Payer: Self-pay | Admitting: Physical Medicine & Rehabilitation

## 2012-08-27 NOTE — Telephone Encounter (Signed)
Echuing Bertelsen PAC placed a referral for the sports and spine to follow her. Will route message to PSS to help set up follow up appointment with MD Carlos American. Closing the TE.    Jamison Neighbor RN  [facility]  [facility]

## 2012-08-27 NOTE — Telephone Encounter (Signed)
Patient received a Fluoroscopic guided caudal injection last Tuesday.     Called the patient and left message for her to call back and give up dates on how she is doing from the injection and if there was any complication.     Jamison Neighbor RN   [facility]   [facility]

## 2012-08-28 NOTE — Telephone Encounter (Signed)
Called the patient Danielle Small states "I am doing quite well and there was no complications from the injection. Please having scheduling call me later today to set up a follow up with MD Carlos American at [facility] sports and spine".    Advised her I will give this message to MD [facility] and scheduling. Danielle Small was agreeable with this.    Jamison Neighbor RN  [facility]  [facility]

## 2012-08-28 NOTE — Telephone Encounter (Signed)
Called patient to schedule at [facility] 9/12 at 2:40    Lonell Grandchild, PSS  [facility]  [facility]

## 2012-08-28 NOTE — Telephone Encounter (Signed)
Closing the TE.     Shazad E Dean RN  [place]  [facility]

## 2012-09-06 ENCOUNTER — Other Ambulatory Visit: Payer: Self-pay

## 2012-09-10 ENCOUNTER — Encounter (HOSPITAL_BASED_OUTPATIENT_CLINIC_OR_DEPARTMENT_OTHER): Payer: Self-pay | Admitting: Physical Medicine & Rehabilitation

## 2012-09-16 ENCOUNTER — Ambulatory Visit (HOSPITAL_BASED_OUTPATIENT_CLINIC_OR_DEPARTMENT_OTHER): Payer: Medicare Other | Attending: Physical Medicine & Rehabilitation | Admitting: Physical Medicine & Rehabilitation

## 2012-09-16 DIAGNOSIS — M545 Low back pain, unspecified: Secondary | ICD-10-CM | POA: Insufficient documentation

## 2012-09-16 DIAGNOSIS — G8929 Other chronic pain: Secondary | ICD-10-CM | POA: Insufficient documentation

## 2012-09-16 DIAGNOSIS — M4800 Spinal stenosis, site unspecified: Secondary | ICD-10-CM | POA: Insufficient documentation

## 2012-10-14 ENCOUNTER — Ambulatory Visit (HOSPITAL_BASED_OUTPATIENT_CLINIC_OR_DEPARTMENT_OTHER): Payer: Medicare Other | Attending: Physical Medicine & Rehabilitation | Admitting: Physical Medicine & Rehabilitation

## 2012-10-14 DIAGNOSIS — M79609 Pain in unspecified limb: Secondary | ICD-10-CM | POA: Insufficient documentation

## 2012-10-14 DIAGNOSIS — M545 Low back pain, unspecified: Secondary | ICD-10-CM | POA: Insufficient documentation

## 2012-10-14 DIAGNOSIS — G8929 Other chronic pain: Secondary | ICD-10-CM | POA: Insufficient documentation

## 2012-10-23 DIAGNOSIS — M65351 Trigger finger, right little finger: Secondary | ICD-10-CM | POA: Insufficient documentation

## 2012-10-23 DIAGNOSIS — M25521 Pain in right elbow: Secondary | ICD-10-CM | POA: Insufficient documentation

## 2012-12-09 ENCOUNTER — Ambulatory Visit (HOSPITAL_BASED_OUTPATIENT_CLINIC_OR_DEPARTMENT_OTHER): Payer: Medicare Other | Attending: Physical Medicine & Rehabilitation | Admitting: Physical Medicine & Rehabilitation

## 2012-12-09 DIAGNOSIS — M545 Low back pain, unspecified: Secondary | ICD-10-CM | POA: Insufficient documentation

## 2012-12-09 DIAGNOSIS — M79609 Pain in unspecified limb: Secondary | ICD-10-CM | POA: Insufficient documentation

## 2012-12-17 ENCOUNTER — Ambulatory Visit (HOSPITAL_BASED_OUTPATIENT_CLINIC_OR_DEPARTMENT_OTHER): Payer: Medicare Other | Attending: Physical Medicine & Rehabilitation | Admitting: Physical Medicine & Rehabilitation

## 2012-12-17 DIAGNOSIS — M479 Spondylosis, unspecified: Secondary | ICD-10-CM | POA: Insufficient documentation

## 2012-12-17 DIAGNOSIS — M545 Low back pain, unspecified: Secondary | ICD-10-CM | POA: Insufficient documentation

## 2012-12-17 DIAGNOSIS — M48061 Spinal stenosis, lumbar region without neurogenic claudication: Secondary | ICD-10-CM | POA: Insufficient documentation

## 2013-01-01 ENCOUNTER — Ambulatory Visit: Payer: Medicare Other | Attending: Family Practice | Admitting: Family Practice

## 2013-01-01 DIAGNOSIS — G8928 Other chronic postprocedural pain: Secondary | ICD-10-CM | POA: Insufficient documentation

## 2013-02-25 DIAGNOSIS — M7712 Lateral epicondylitis, left elbow: Secondary | ICD-10-CM | POA: Insufficient documentation

## 2013-03-12 ENCOUNTER — Ambulatory Visit: Payer: Medicare Other | Attending: Family Practice | Admitting: Family Practice

## 2013-03-12 DIAGNOSIS — M545 Low back pain, unspecified: Secondary | ICD-10-CM | POA: Insufficient documentation

## 2013-03-26 ENCOUNTER — Encounter (HOSPITAL_BASED_OUTPATIENT_CLINIC_OR_DEPARTMENT_OTHER): Payer: Self-pay | Admitting: Family Practice

## 2013-03-26 ENCOUNTER — Ambulatory Visit: Payer: Self-pay | Attending: Family Practice | Admitting: Family Practice

## 2013-03-26 ENCOUNTER — Ambulatory Visit: Payer: Medicare Other | Attending: Family Practice | Admitting: Family Practice

## 2013-03-26 ENCOUNTER — Encounter (HOSPITAL_BASED_OUTPATIENT_CLINIC_OR_DEPARTMENT_OTHER): Payer: Medicare Other | Admitting: Family Practice

## 2013-04-09 ENCOUNTER — Ambulatory Visit: Payer: Medicare Other | Admitting: Family Practice

## 2013-04-09 ENCOUNTER — Ambulatory Visit: Payer: Self-pay | Attending: Family Practice | Admitting: Family Practice

## 2013-04-28 ENCOUNTER — Ambulatory Visit: Payer: Medicare Other | Attending: Family Practice | Admitting: Family Practice

## 2013-04-28 ENCOUNTER — Encounter (HOSPITAL_BASED_OUTPATIENT_CLINIC_OR_DEPARTMENT_OTHER): Payer: Medicare Other | Admitting: Family Practice

## 2013-04-28 ENCOUNTER — Encounter (HOSPITAL_BASED_OUTPATIENT_CLINIC_OR_DEPARTMENT_OTHER): Payer: Self-pay | Admitting: Family Practice

## 2013-04-28 ENCOUNTER — Ambulatory Visit: Payer: Self-pay | Attending: Family Practice | Admitting: Family Practice

## 2013-04-28 DIAGNOSIS — IMO0001 Reserved for inherently not codable concepts without codable children: Secondary | ICD-10-CM | POA: Insufficient documentation

## 2013-04-28 DIAGNOSIS — G8928 Other chronic postprocedural pain: Secondary | ICD-10-CM | POA: Insufficient documentation

## 2013-06-09 ENCOUNTER — Ambulatory Visit: Payer: Self-pay | Attending: Family Practice | Admitting: Family Practice

## 2013-06-09 ENCOUNTER — Ambulatory Visit: Payer: Medicare Other | Attending: Family Practice | Admitting: Family Practice

## 2013-06-09 ENCOUNTER — Encounter (HOSPITAL_BASED_OUTPATIENT_CLINIC_OR_DEPARTMENT_OTHER): Payer: Self-pay | Admitting: Family Practice

## 2013-06-09 VITALS — BP 161/100 | HR 77 | Ht 62.0 in | Wt 160.4 lb

## 2013-06-09 NOTE — Progress Notes (Signed)
Please see associated visit for same DOS for progress note.

## 2013-06-09 NOTE — Patient Instructions (Signed)
Stretches  Keep swimming  Yoga

## 2013-06-09 NOTE — Progress Notes (Signed)
Patient Referred By: No ref. provider found  Patient's PCP: Ella Jubilee     Subjective:  Patient is a 67 year old year old female, here to discuss Hip Pain and Extremity Pain        Hip Pain     Extremity Pain       Recent onset of pain and numbness in the front of the right thigh- burning and sensitive.  Started a week ago after gardening.   She swims also a mile and kicking after.  Associated with pain at the top of buttock on the right    Relief with-reclining a little  Melatonin and gabapentin and progesterone and tart cherry juice to sleep and occasional plain tylenol    Review of Systems   Respiratory: Negative.    Cardiovascular: Negative.    Genitourinary:        Following healthy diet as discussed  Normal bowel movements  Off starches    If eats the wrong things she feels more pain     PHQ4 6   Pain interference scores: general activity 8       Enjoyment of life 9       Falling asleep  7        Staying asleep 7       Objective:  Physical Exam   Constitutional: She is oriented to person, place, and time. She appears well-developed and well-nourished.   HENT:   Head: Normocephalic.   Pulmonary/Chest: No respiratory distress.   Musculoskeletal:   No pain or reproduction of symptoms with forward flexion  reproduc symptoms with extensuion and twisting to right     Neurological: She is alert and oriented to person, place, and time.     Review of BW she brought in:     Back flexion- does not exacerbate the pain      PROCEDURE: Skin was sprayed with an alcohol containing menthol spray if the patient agreed.  After palpation for taut bands for trigger points identified by local tenderness, local twitch response or radiation of pain or reproduction of symptoms.   The following muscles were treated using an  Injection technique, with a solid core needles which are .25 to .35 mm in diameter and from 15-75 mm long.   No substance was injected.    MUSCLES TREATED  semispinalis, spinalis, multifidus,  paraspinals L spine, quadratus lumborum, iliocostalis lumborum, iliocostalis thoracic, gluteus medius, gluteus minimus, gluteus maximus, piriformis, gemelli, obturator, tensor fasciae lata, quads, sartorius, iliacus         Assessment and Plan:  Stretches  Keep swimming  Yoga

## 2013-06-17 NOTE — Addendum Note (Signed)
Addended by: Braxton Feathers T on: 06/17/2013 01:48 PM     Modules accepted: Orders

## 2013-06-18 NOTE — Addendum Note (Signed)
Addended by: Alda Lea on: 06/18/2013 12:31 PM     Modules accepted: Orders

## 2013-08-11 ENCOUNTER — Ambulatory Visit: Payer: Medicare Other | Attending: Family Practice | Admitting: Family Practice

## 2013-08-11 ENCOUNTER — Encounter (HOSPITAL_BASED_OUTPATIENT_CLINIC_OR_DEPARTMENT_OTHER): Payer: Self-pay | Admitting: Family Practice

## 2013-08-11 ENCOUNTER — Ambulatory Visit: Payer: Self-pay | Attending: Family Practice | Admitting: Family Practice

## 2013-08-11 VITALS — BP 114/81 | HR 98 | Ht 62.0 in | Wt 160.0 lb

## 2013-08-11 DIAGNOSIS — M545 Low back pain, unspecified: Secondary | ICD-10-CM | POA: Insufficient documentation

## 2013-08-11 DIAGNOSIS — IMO0002 Reserved for concepts with insufficient information to code with codable children: Secondary | ICD-10-CM | POA: Insufficient documentation

## 2013-08-11 NOTE — Progress Notes (Signed)
Please see associated visit for same DOS for progress note.

## 2013-08-11 NOTE — Progress Notes (Signed)
Patient Referred By: No ref. provider found  Patient's PCP: Ella Jubilee     Subjective:  Patient is a 67 year old year old female, here to discuss Pain    HPI  Able to do all her activities  Trimming rhododendrons  helping daughter with her dairy goats  Had a steroid shot in the muscles of the back and that helped some sharp pain  Still numbness of the front of right thigh- periodically throbbing pain if sitting too long. Some sharp pain ito leg that makes it jump  Stabbing pain improving        Review of Systems  Diet  Few more carbs and gets more joint stiffness and inflammation  Large salad for dinner with some protein  Fruit every day  1 cup coffee per day- no soda,      Exercising regularly  PHQ9  8   Pain interference scores:        Falling asleep  4        Staying asleep 3       Objective:  Physical Exam   Constitutional: She is oriented to person, place, and time. She appears well-developed and well-nourished. No distress.   HENT:   Head: Normocephalic.   Pulmonary/Chest: No respiratory distress.   Abdominal: She exhibits no distension.   Neurological: She is alert and oriented to person, place, and time.   Skin: Skin is warm and dry. She is not diaphoretic.   Psychiatric: She has a normal mood and affect. Her behavior is normal. Judgment and thought content normal.     CONSENT: was obtained from the patient after a description of the procedure and the risks and side effects including: bruising and bleeding, lightheadedness or dizziness, perforated viscus such as pneumothorax, exacerbation or change in pain, or infection.   PROCEDURE: Skin was sprayed with an alcohol containing menthol spray if the patient agreed.  After palpation for taut bands for trigger points identified by local tenderness, local twitch response or radiation of pain or reproduction of symptoms.   The following muscles were treated using an  Injection technique, with a solid core needles which are .25 to .35 mm in diameter  and from 15-75 mm long.   No substance was injected.    MUSCLES TREATED  upper fibers of trapezius    paraspinals L spine, iliocostalis lumborum, gluteus medius, gluteus minimus, gluteus maximus, piriformis, gemelli, obturator, tensor fasciae lata, iliotibial band, hamstrings, quads, sartorius, psoas major and rectus femoris     Assessment and Plan:  Discussed fruit juices and acceptable ways of intake  Continue with routine,   IMS-call in a few months, or alternately book ahead and cancel if needed.  Stretches      Face to face time was 20 min in addition to the procedure with over half the time spent counselling regarding milk cow ve dailr

## 2013-09-08 NOTE — Addendum Note (Signed)
Addended by: Braxton Feathers T on: 09/08/2013 02:13 PM     Modules accepted: Orders

## 2013-10-08 ENCOUNTER — Ambulatory Visit: Payer: Self-pay | Attending: Family Practice | Admitting: Family Practice

## 2013-10-08 ENCOUNTER — Encounter (HOSPITAL_BASED_OUTPATIENT_CLINIC_OR_DEPARTMENT_OTHER): Payer: Self-pay | Admitting: Family Practice

## 2013-10-08 ENCOUNTER — Ambulatory Visit: Payer: Medicare Other | Attending: Family Practice | Admitting: Family Practice

## 2013-10-08 VITALS — BP 146/96 | HR 90 | Ht 62.0 in | Wt 166.0 lb

## 2013-10-08 DIAGNOSIS — M545 Low back pain, unspecified: Secondary | ICD-10-CM | POA: Insufficient documentation

## 2013-10-08 DIAGNOSIS — M47817 Spondylosis without myelopathy or radiculopathy, lumbosacral region: Secondary | ICD-10-CM | POA: Insufficient documentation

## 2013-10-08 NOTE — Patient Instructions (Signed)
foam roller  Or 2 tennis balls tied in a sock

## 2013-10-08 NOTE — Progress Notes (Signed)
Patient Referred By: No ref. provider found  Patient's PCP: Ella Jubilee     Subjective:  Patient is a 67 year old year old female, here to discuss Low Back Pain and Pain   Extremity Pain       HPI  Has been doing well.  [place] and her [place]  Has a new car that requires her to turn her head more to see. Getting used to it.   Was back to swimming and then stopped because of pool maintenance  Improvementt of pain and numbness in the front of the right thigh- burning and sensitive.Reaching and kneeling gives her pains up the front of the thigh to the groin - it is burning and stabbing. She is usually on a small stool and has a foam pad.   Relief with-rest and massaging it  Melatonin and gabapentin and progesterone and tart cherry juice to sleep and occasional plain tylenol       Review of Systems  Respiratory: Negative.   Cardiovascular: Negative.   Genitourinary: neg  Careful with her diet, avoiding processed food, no soda, rarely high glycemic foods. She feels the difference    PHQ4 4   Pain interference scores: general activity 3  Enjoyment of life 4   Falling asleep 3   Staying asleep 2   History of cervical and lumbar fusion    Objective:  Physical Exam    Constitutional: She is oriented to person, place, and time. She appears well-developed and well-nourished.   HENT:   Head: Normocephalic.   Pulmonary/Chest: No respiratory distress.   Musculoskeletal:   No pain or reproduction of symptoms with forward flexion  reproduc symptoms with extensuion and twisting to right    Neurological: She is alert and oriented to person, place, and time.   Back flexion- does not exacerbate the pain   Tender TPs in right quads and sartorius, reduced ROM of the neck with rotation to right is 45 deg and to the left is 55 deg, lateral flexion to the right is 25 deg and to the left is 20 deg.  PROCEDURE: Skin was sprayed with an alcohol containing menthol spray if the patient agreed.      After palpation for taut bands for trigger points identified by local tenderness, local twitch response or radiation of pain or reproduction of symptoms.   The following muscles were treated using an Injection technique, with a solid core needles which are .25 to .35 mm in diameter and from 15-75 mm long.   No substance was injected.   MUSCLES TREATED   semispinalis, spinalis, multifidus, paraspinals L spine, , iliocostalis lumborum, , gluteus medius,gluteus maximus,tensor fasciae lata, quads, sartorius,right psoas  UFT, lev scapulae, anterior scalene, middle scalene,  spleneii      Assessment and Plan:  Ms Metheny is a 67 year old year old female,with Low Back Pain and  Extremity Pain and trouble turning her neck  She has myofascial TPs in the neck and low back and right quad. She is very functional and has made helpful lifestyle changes that improve her pain status. She is sleeping well and all her metrics and her self report verifies improvement.     Self massage with foam roller or tennis balls  Stretches   Yoga      Face to face time was 25 min in addition to the procedure with over half the time spent counselling regarding foam roller for TPs

## 2013-10-08 NOTE — Progress Notes (Signed)
Please see associated visit for same DOS for progress note.

## 2013-10-21 DIAGNOSIS — E039 Hypothyroidism, unspecified: Secondary | ICD-10-CM | POA: Insufficient documentation

## 2013-10-21 DIAGNOSIS — Z862 Personal history of diseases of the blood and blood-forming organs and certain disorders involving the immune mechanism: Secondary | ICD-10-CM | POA: Insufficient documentation

## 2013-10-21 DIAGNOSIS — I10 Essential (primary) hypertension: Secondary | ICD-10-CM | POA: Insufficient documentation

## 2013-10-21 DIAGNOSIS — K219 Gastro-esophageal reflux disease without esophagitis: Secondary | ICD-10-CM | POA: Insufficient documentation

## 2013-10-21 DIAGNOSIS — E785 Hyperlipidemia, unspecified: Secondary | ICD-10-CM | POA: Insufficient documentation

## 2013-12-05 ENCOUNTER — Other Ambulatory Visit (HOSPITAL_BASED_OUTPATIENT_CLINIC_OR_DEPARTMENT_OTHER): Payer: Self-pay | Admitting: Family Practice

## 2013-12-10 MED ORDER — PROGESTERONE MICRONIZED 100 MG OR CAPS
ORAL_CAPSULE | ORAL | Status: DC
Start: 2013-12-05 — End: 2014-02-03

## 2014-01-07 ENCOUNTER — Ambulatory Visit: Payer: Self-pay | Attending: Family Practice | Admitting: Family Practice

## 2014-01-07 ENCOUNTER — Encounter (HOSPITAL_BASED_OUTPATIENT_CLINIC_OR_DEPARTMENT_OTHER): Payer: Self-pay | Admitting: Family Practice

## 2014-01-07 ENCOUNTER — Ambulatory Visit: Payer: Medicare Other | Attending: Family Practice | Admitting: Family Practice

## 2014-01-07 VITALS — BP 128/84 | HR 87 | Wt 169.0 lb

## 2014-01-07 DIAGNOSIS — M545 Low back pain, unspecified: Secondary | ICD-10-CM | POA: Insufficient documentation

## 2014-01-07 DIAGNOSIS — M25511 Pain in right shoulder: Secondary | ICD-10-CM

## 2014-01-07 DIAGNOSIS — G8929 Other chronic pain: Secondary | ICD-10-CM | POA: Insufficient documentation

## 2014-01-07 DIAGNOSIS — M25519 Pain in unspecified shoulder: Secondary | ICD-10-CM

## 2014-01-07 DIAGNOSIS — M47817 Spondylosis without myelopathy or radiculopathy, lumbosacral region: Secondary | ICD-10-CM

## 2014-01-07 NOTE — Progress Notes (Signed)
Please see associated visit for same DOS for progress note.

## 2014-01-07 NOTE — Progress Notes (Signed)
Patient Referred By: No ref. provider found  Patient's PCP: Danielle Small     Subjective:  Patient is a 68 year old female, here to discuss Follow-Up   for neck and back pain and right hip pain  HPI  Overall Danielle Small has bveen doing very well with  Less pain, more flexibility and better function.   Numbness and pain down side of right leg- used foam roller a little- hurts a lot  Neck tense at the back, right shoulder pain-aches across the shoulder blade  Limited pushing power  Can lift it overhead  Worse with pushing weight especially upwards    Back is doing well- felt tense over christmas with a lot of standing and cooking  Better with resting  She has not been on Vitamind D lateley  Review with patient of PSFH and meds and no changes noted except husband still in dialysis for having no renal function from bilateral cancers in kidneys.He does not follow the diet well and this is a concern for her.     Review of Systems  RESP: Cough from allergies in the fall and then got a  Sinus infection- went on sulfa  Not swimming  Concerns re recurrent bronchitis  Psych: PHQ9-7  Sleep-still has interference  GI- daily BM    Objective:  Physical Exam    Constitutional: She is oriented to person, place, and time. She appears well-developed and well-nourished.   HENT:   Head: Normocephalic.   Pulmonary/Chest: No respiratory distress.   Musculoskeletal:   shoulder abd 180 and int rotation to T8, tenderness of infraspin, deltoid and supraspinatus  No pain or reproduction of symptoms with forward flexion  Neck ROM is improved overall with 70 deg rotation to right and left and 25 deg of lateral flexion at start of appt.   Tender TFL on the right with ITB affected. Pirif and glut TPs also.    Neurological: She is alert and oriented to person, place, and time.   Back flexion- does not exacerbate the pain   Tender TPs in right quads and sartorius, reduced ROM of the neck with rotation to right is 45 deg and to the left  is 55 deg, lateral flexion to the right is 25 deg and to the left is 20 deg.   PROCEDURE: Skin was sprayed with an alcohol containing menthol spray if the patient agreed.   After palpation for taut bands for trigger points identified by local tenderness, local twitch response or radiation of pain or reproduction of symptoms.   The following muscles were treated using an Injection technique, with a solid core needles which are .25 to .35 mm in diameter and from 15-75 mm long.   No substance was injected.   MUSCLES TREATED   semispinalis, spinalis, multifidus,  lev scapulae, anterior scalene, middle scalene, spleneii UFT, infraspin, supraspin, deltoid, pec major and minor, triceps, paraspinals L spine, , iliocostalis lumborum, , gluteus medius,gluteus maximus,tensor fasciae lata,  psoas      Assessment and Plan:    Danielle Small is a 68 year old year old female,with Low Back Pain and Extremity Pain and trouble turning her neck and she is improving overall  She has myofascial TPs in the neck and low back and right quad. She is very functional and has made helpful lifestyle changes that improve her pain status. She is sleeping well and all her metrics and her self report verifies improvement.   Stretches   Vitamin DWhen sick take  15,000 iu per day  Regular through winter is 5000iu per day    [facility] on fungal component of chronic and recurrent sinus infections.  Iron should be stopped unless a test shows you are deficient    Face to face time was 25 min in addition to the procedure with over half the time spent counselling regarding neti pot for sinus, vitamin D for infection and pain  j

## 2014-01-07 NOTE — Patient Instructions (Addendum)
[facility] studies on fungal component of chronic and recurrent sinus infections.  When sick take 15,000 iu per day    Regular through winter is 5000iu per day    Iron should be stopped unless a test shows you are deficient

## 2014-01-07 NOTE — Progress Notes (Deleted)
Numbness and pain down side of right leg- used foam roller a little- hurts a lot  Neck tense at the back, right shoulder pain-aches across the shoulder blade  Limited pushing power  Can lift it overhead  Worse with pushing weight especially upwards    Back is doing well- tense over christmas with a lot of standing and cooking  Better with resting    Cough from allergies in the fall and then got a  Sinus infection- went on sulfa  Not swimming    Concerns re recurrent bronchitis      OE  shoulder abd 180 and int rotation to T8

## 2014-02-03 ENCOUNTER — Other Ambulatory Visit (HOSPITAL_BASED_OUTPATIENT_CLINIC_OR_DEPARTMENT_OTHER): Payer: Self-pay | Admitting: Family Practice

## 2014-02-03 DIAGNOSIS — Z780 Asymptomatic menopausal state: Secondary | ICD-10-CM

## 2014-02-03 DIAGNOSIS — G4700 Insomnia, unspecified: Secondary | ICD-10-CM

## 2014-02-07 ENCOUNTER — Telehealth (HOSPITAL_BASED_OUTPATIENT_CLINIC_OR_DEPARTMENT_OTHER): Payer: Self-pay

## 2014-02-07 DIAGNOSIS — G4700 Insomnia, unspecified: Secondary | ICD-10-CM

## 2014-02-07 DIAGNOSIS — Z780 Asymptomatic menopausal state: Secondary | ICD-10-CM

## 2014-02-07 MED ORDER — PROGESTERONE MICRONIZED 100 MG OR CAPS
ORAL_CAPSULE | ORAL | Status: DC
Start: 2014-02-03 — End: 2014-02-07

## 2014-02-28 MED ORDER — PROGESTERONE MICRONIZED 100 MG OR CAPS
100.0000 mg | ORAL_CAPSULE | Freq: Every evening | ORAL | Status: AC
Start: 2014-02-07 — End: ?

## 2014-04-07 ENCOUNTER — Ambulatory Visit (HOSPITAL_BASED_OUTPATIENT_CLINIC_OR_DEPARTMENT_OTHER): Payer: Self-pay | Admitting: Family Practice

## 2014-04-07 ENCOUNTER — Encounter (HOSPITAL_BASED_OUTPATIENT_CLINIC_OR_DEPARTMENT_OTHER): Payer: Medicare Other | Admitting: Family Practice

## 2014-06-23 ENCOUNTER — Ambulatory Visit (HOSPITAL_BASED_OUTPATIENT_CLINIC_OR_DEPARTMENT_OTHER): Payer: Self-pay | Admitting: Family Practice

## 2014-06-23 ENCOUNTER — Encounter (HOSPITAL_BASED_OUTPATIENT_CLINIC_OR_DEPARTMENT_OTHER): Payer: Medicare Other | Admitting: Family Practice

## 2014-08-25 ENCOUNTER — Ambulatory Visit (HOSPITAL_BASED_OUTPATIENT_CLINIC_OR_DEPARTMENT_OTHER): Payer: Self-pay | Admitting: Family Practice

## 2014-08-25 ENCOUNTER — Encounter (HOSPITAL_BASED_OUTPATIENT_CLINIC_OR_DEPARTMENT_OTHER): Payer: Medicare Other | Admitting: Family Practice

## 2014-09-07 DIAGNOSIS — R911 Solitary pulmonary nodule: Secondary | ICD-10-CM | POA: Insufficient documentation

## 2014-10-20 ENCOUNTER — Ambulatory Visit (HOSPITAL_BASED_OUTPATIENT_CLINIC_OR_DEPARTMENT_OTHER): Payer: Self-pay | Admitting: Family Practice

## 2014-10-20 ENCOUNTER — Encounter (HOSPITAL_BASED_OUTPATIENT_CLINIC_OR_DEPARTMENT_OTHER): Payer: Medicare Other | Admitting: Family Practice

## 2014-12-02 ENCOUNTER — Encounter (HOSPITAL_BASED_OUTPATIENT_CLINIC_OR_DEPARTMENT_OTHER): Payer: Medicare Other | Admitting: Family Practice

## 2014-12-02 ENCOUNTER — Ambulatory Visit (HOSPITAL_BASED_OUTPATIENT_CLINIC_OR_DEPARTMENT_OTHER): Payer: Self-pay | Admitting: Family Practice

## 2015-02-02 ENCOUNTER — Ambulatory Visit (HOSPITAL_BASED_OUTPATIENT_CLINIC_OR_DEPARTMENT_OTHER): Payer: Self-pay | Admitting: Family Practice

## 2015-02-02 ENCOUNTER — Encounter (HOSPITAL_BASED_OUTPATIENT_CLINIC_OR_DEPARTMENT_OTHER): Payer: Medicare Other | Admitting: Family Practice

## 2015-03-24 ENCOUNTER — Ambulatory Visit (HOSPITAL_BASED_OUTPATIENT_CLINIC_OR_DEPARTMENT_OTHER): Payer: Self-pay | Admitting: Family Practice

## 2015-03-24 ENCOUNTER — Encounter (HOSPITAL_BASED_OUTPATIENT_CLINIC_OR_DEPARTMENT_OTHER): Payer: Medicare Other | Admitting: Family Practice

## 2015-06-01 ENCOUNTER — Encounter (HOSPITAL_BASED_OUTPATIENT_CLINIC_OR_DEPARTMENT_OTHER): Payer: Medicare Other | Admitting: Family Practice

## 2015-06-01 ENCOUNTER — Ambulatory Visit (HOSPITAL_BASED_OUTPATIENT_CLINIC_OR_DEPARTMENT_OTHER): Payer: Self-pay | Admitting: Family Practice

## 2016-04-17 DIAGNOSIS — I83812 Varicose veins of left lower extremities with pain: Secondary | ICD-10-CM | POA: Insufficient documentation

## 2018-09-15 DIAGNOSIS — J302 Other seasonal allergic rhinitis: Secondary | ICD-10-CM | POA: Insufficient documentation

## 2018-09-15 DIAGNOSIS — R0683 Snoring: Secondary | ICD-10-CM | POA: Insufficient documentation

## 2018-11-23 ENCOUNTER — Ambulatory Visit: Payer: Medicare PPO | Admitting: Podiatry

## 2019-03-11 DIAGNOSIS — J0100 Acute maxillary sinusitis, unspecified: Secondary | ICD-10-CM | POA: Diagnosis not present

## 2019-03-11 DIAGNOSIS — R6889 Other general symptoms and signs: Secondary | ICD-10-CM | POA: Diagnosis not present

## 2019-04-01 ENCOUNTER — Other Ambulatory Visit: Payer: Self-pay | Admitting: *Deleted

## 2019-04-05 ENCOUNTER — Ambulatory Visit: Payer: Medicare PPO | Admitting: Physician Assistant

## 2019-04-08 DIAGNOSIS — M654 Radial styloid tenosynovitis [de Quervain]: Secondary | ICD-10-CM | POA: Diagnosis not present

## 2019-04-08 DIAGNOSIS — M7552 Bursitis of left shoulder: Secondary | ICD-10-CM | POA: Diagnosis not present

## 2019-04-30 ENCOUNTER — Encounter (INDEPENDENT_AMBULATORY_CARE_PROVIDER_SITE_OTHER): Payer: Self-pay

## 2019-04-30 ENCOUNTER — Encounter: Payer: Self-pay | Admitting: Physician Assistant

## 2019-04-30 ENCOUNTER — Other Ambulatory Visit: Payer: Self-pay

## 2019-04-30 ENCOUNTER — Ambulatory Visit: Payer: Self-pay | Admitting: Family Medicine

## 2019-04-30 ENCOUNTER — Ambulatory Visit (INDEPENDENT_AMBULATORY_CARE_PROVIDER_SITE_OTHER): Payer: Medicare Other | Admitting: Physician Assistant

## 2019-04-30 VITALS — BP 139/74 | HR 73 | Temp 97.9°F | Ht 67.0 in | Wt 213.2 lb

## 2019-04-30 DIAGNOSIS — I609 Nontraumatic subarachnoid hemorrhage, unspecified: Secondary | ICD-10-CM | POA: Diagnosis not present

## 2019-04-30 DIAGNOSIS — R0681 Apnea, not elsewhere classified: Secondary | ICD-10-CM

## 2019-04-30 DIAGNOSIS — E039 Hypothyroidism, unspecified: Secondary | ICD-10-CM

## 2019-04-30 DIAGNOSIS — G4762 Sleep related leg cramps: Secondary | ICD-10-CM

## 2019-04-30 DIAGNOSIS — R5382 Chronic fatigue, unspecified: Secondary | ICD-10-CM

## 2019-04-30 MED ORDER — ROPINIROLE HCL 0.25 MG PO TABS: 0.2500 mg | ORAL_TABLET | Freq: Every day | ORAL | 2 refills | Status: DC

## 2019-05-01 LAB — CBC WITH DIFFERENTIAL/PLATELET
Basophils Absolute: 0.1 10*3/uL (ref 0.0–0.2)
Basos: 1 %
EOS (ABSOLUTE): 0.2 10*3/uL (ref 0.0–0.4)
Eos: 2 %
Hematocrit: 38.8 % (ref 34.0–46.6)
Hemoglobin: 13.5 g/dL (ref 11.1–15.9)
Immature Grans (Abs): 0 10*3/uL (ref 0.0–0.1)
Immature Granulocytes: 0 %
Lymphocytes Absolute: 2.2 10*3/uL (ref 0.7–3.1)
Lymphs: 26 %
MCH: 31.4 pg (ref 26.6–33.0)
MCHC: 34.8 g/dL (ref 31.5–35.7)
MCV: 90 fL (ref 79–97)
Monocytes Absolute: 0.8 10*3/uL (ref 0.1–0.9)
Monocytes: 9 %
Neutrophils Absolute: 5.2 10*3/uL (ref 1.4–7.0)
Neutrophils: 62 %
Platelets: 298 10*3/uL (ref 150–450)
RBC: 4.3 x10E6/uL (ref 3.77–5.28)
RDW: 13.5 % (ref 11.7–15.4)
WBC: 8.4 10*3/uL (ref 3.4–10.8)

## 2019-05-01 LAB — CMP14+EGFR
ALT: 19 IU/L (ref 0–32)
AST: 14 IU/L (ref 0–40)
Albumin/Globulin Ratio: 1.4 (ref 1.2–2.2)
Albumin: 4 g/dL (ref 3.7–4.7)
Alkaline Phosphatase: 86 IU/L (ref 39–117)
BUN/Creatinine Ratio: 18 (ref 12–28)
BUN: 14 mg/dL (ref 8–27)
Bilirubin Total: 0.4 mg/dL (ref 0.0–1.2)
CO2: 25 mmol/L (ref 20–29)
Calcium: 9.1 mg/dL (ref 8.7–10.3)
Chloride: 102 mmol/L (ref 96–106)
Creatinine, Ser: 0.78 mg/dL (ref 0.57–1.00)
GFR calc Af Amer: 88 mL/min/{1.73_m2} (ref >59)
GFR calc non Af Amer: 77 mL/min/{1.73_m2} (ref >59)
Globulin, Total: 2.9 g/dL (ref 1.5–4.5)
Glucose: 82 mg/dL (ref 65–99)
Potassium: 3.7 mmol/L (ref 3.5–5.2)
Sodium: 140 mmol/L (ref 134–144)
Total Protein: 6.9 g/dL (ref 6.0–8.5)

## 2019-05-01 LAB — MAGNESIUM: Magnesium: 2 mg/dL (ref 1.6–2.3)

## 2019-05-01 LAB — TSH: TSH: 2.01 u[IU]/mL (ref 0.450–4.500)

## 2019-05-02 NOTE — Progress Notes (Signed)
BP 139/74   Pulse 73   Temp 97.9 F (36.6 C) (Oral)   Ht _0  (1.702 m)   Wt 213 lb 3.2 oz (96.7 kg)   BMI 33.39 kg/m    Subjective:    Patient ID: Leslie Hensley, female    DOB: [birth year], 73 y.o.   MRN: [id number]  HPI: Leslie Hensley is a 73 y.o. female presenting on 04/30/2019 for New Patient (Initial Visit) and Leg cramps  This patient comes in to be established with out practice.  She was seen in past by me but has been several years.  Her medical history is reviewed.  She has been seeing specialist at [facility].  She had a brain aneurysm and does have coils placed.  Her other medical history is positive for hyperlipidemia, hypertension, hypothyroidism.  She also has had several orthopedic issues. The patient states that she did have a biopsy of her last time but she is not planning to go on a yearly basis with her mammograms.  She still sees her neurosurgeon.  They do MRIs accordingly.  She also is dealing with a pain specialist for her tendinitis in her wrist.  The only new symptoms that she has noted is that she has witnessed apneas.  Her sister lives with her.  And she states that she will have frequent apneas whenever she falls asleep in the living room and laying on her back.  The patient does try to sleep on her sides when she is sleep.  She does note fatigue and frequent waking at night. All medications are reviewed today. There are no reports of any problems with the medications. All of the medical conditions are reviewed and updated.  Lab work is reviewed and will be ordered as medically necessary. There are no new problems reported with today's visit.   Past Medical History:  Diagnosis Date  . Brain aneurysm   . Hyperlipidemia   . Hypertension   . Thyroid disease    Relevant past medical, surgical, family and social history reviewed and updated as indicated. Interim medical history since our last visit reviewed. Allergies and medications reviewed  and updated. DATA REVIEWED: CHART IN EPIC  Family History reviewed for pertinent findings.  Review of Systems  Constitutional: Positive for fatigue.  HENT: Negative.   Eyes: Negative.   Respiratory: Negative.  Negative for cough, shortness of breath and wheezing.   Cardiovascular: Negative.   Gastrointestinal: Negative.   Genitourinary: Negative.   Psychiatric/Behavioral: Negative.     Allergies as of 04/30/2019      Reactions   Codeine Hives   Cyclobenzaprine Swelling   Diclofenac Sodium Rash      Medication List       Accurate as of Apr 30, 2019 11:59 PM. Always use your most recent med list.        aspirin EC 81 MG tablet Take by mouth.   hydrochlorothiazide 25 MG tablet Commonly known as:  HYDRODIURIL   levothyroxine 75 MCG tablet Commonly known as:  SYNTHROID   Omega-3 1000 MG Caps Take by mouth.   rOPINIRole 0.25 MG tablet Commonly known as:  Requip Take 1-3 tablets (0.25-0.75 mg total) by mouth at bedtime.   simvastatin 20 MG tablet Commonly known as:  ZOCOR   Vitamin D3 25 MCG (1000 UT) Caps Take by mouth.   Vitamin E 400 units Tabs Take by mouth.          Objective:    BP  139/74   Pulse 73   Temp 97.9 F (36.6 C) (Oral)   Ht _0  (1.702 m)   Wt 213 lb 3.2 oz (96.7 kg)   BMI 33.39 kg/m   Allergies  Allergen Reactions  . Codeine Hives  . Cyclobenzaprine Swelling  . Diclofenac Sodium Rash    Wt Readings from Last 3 Encounters:  04/30/19 213 lb 3.2 oz (96.7 kg)    Physical Exam Constitutional:      Appearance: She is well-developed.  HENT:     Head: Normocephalic and atraumatic.     Right Ear: Tympanic membrane, ear canal and external ear normal.     Left Ear: Tympanic membrane, ear canal and external ear normal.     Nose: Nose normal. No rhinorrhea.     Mouth/Throat:     Pharynx: No oropharyngeal exudate or posterior oropharyngeal erythema.  Eyes:     Conjunctiva/sclera: Conjunctivae normal.     Pupils: Pupils are equal,  round, and reactive to light.  Neck:     Musculoskeletal: Normal range of motion and neck supple.  Cardiovascular:     Rate and Rhythm: Normal rate and regular rhythm.     Heart sounds: Normal heart sounds.  Pulmonary:     Effort: Pulmonary effort is normal.     Breath sounds: Normal breath sounds.  Abdominal:     General: Bowel sounds are normal.     Palpations: Abdomen is soft.  Skin:    General: Skin is warm and dry.     Findings: No rash.  Neurological:     Mental Status: She is alert and oriented to person, place, and time.     Deep Tendon Reflexes: Reflexes are normal and symmetric.  Psychiatric:        Behavior: Behavior normal.        Thought Content: Thought content normal.        Judgment: Judgment normal.     Results for orders placed or performed in visit on 04/30/19  CBC with Differential/Platelet  Result Value Ref Range   WBC 8.4 3.4 - 10.8 x10E3/uL   RBC 4.30 3.77 - 5.28 x10E6/uL   Hemoglobin 13.5 11.1 - 15.9 g/dL   Hematocrit 38.8 34.0 - 46.6 %   MCV 90 79 - 97 fL   MCH 31.4 26.6 - 33.0 pg   MCHC 34.8 31.5 - 35.7 g/dL   RDW 13.5 11.7 - 15.4 %   Platelets 298 150 - 450 x10E3/uL   Neutrophils 62 Not Estab. %   Lymphs 26 Not Estab. %   Monocytes 9 Not Estab. %   Eos 2 Not Estab. %   Basos 1 Not Estab. %   Neutrophils Absolute 5.2 1.4 - 7.0 x10E3/uL   Lymphocytes Absolute 2.2 0.7 - 3.1 x10E3/uL   Monocytes Absolute 0.8 0.1 - 0.9 x10E3/uL   EOS (ABSOLUTE) 0.2 0.0 - 0.4 x10E3/uL   Basophils Absolute 0.1 0.0 - 0.2 x10E3/uL   Immature Granulocytes 0 Not Estab. %   Immature Grans (Abs) 0.0 0.0 - 0.1 x10E3/uL  CMP14+EGFR  Result Value Ref Range   Glucose 82 65 - 99 mg/dL   BUN 14 8 - 27 mg/dL   Creatinine, Ser 0.78 0.57 - 1.00 mg/dL   GFR calc non Af Amer 77 >59 mL/min/1.73   GFR calc Af Amer 88 >59 mL/min/1.73   BUN/Creatinine Ratio 18 12 - 28   Sodium 140 134 - 144 mmol/L   Potassium 3.7 3.5 - 5.2 mmol/L  Chloride 102 96 - 106 mmol/L   CO2 25 20 -  29 mmol/L   Calcium 9.1 8.7 - 10.3 mg/dL   Total Protein 6.9 6.0 - 8.5 g/dL   Albumin 4.0 3.7 - 4.7 g/dL   Globulin, Total 2.9 1.5 - 4.5 g/dL   Albumin/Globulin Ratio 1.4 1.2 - 2.2   Bilirubin Total 0.4 0.0 - 1.2 mg/dL   Alkaline Phosphatase 86 39 - 117 IU/L   AST 14 0 - 40 IU/L   ALT 19 0 - 32 IU/L  TSH  Result Value Ref Range   TSH 2.010 0.450 - 4.500 uIU/mL  Magnesium  Result Value Ref Range   Magnesium 2.0 1.6 - 2.3 mg/dL      Assessment & Plan:   1. Nocturnal leg cramps - CBC with Differential/Platelet - CMP14+EGFR - TSH - Magnesium - rOPINIRole (REQUIP) 0.25 MG tablet; Take 1-3 tablets (0.25-0.75 mg total) by mouth at bedtime.  Dispense: 60 tablet; Refill: 2  2. Bleeding in brain due to brain aneurysm (HCC) - aspirin EC 81 MG tablet; Take by mouth.  3. Hypothyroidism, unspecified type - TSH  4. Witnessed episode of apnea - Ambulatory referral to Sleep Studies  5. Chronic fatigue - Ambulatory referral to Sleep Studies   Continue all other maintenance medications as listed above.  Follow up plan: No follow-ups on file.  Educational handout given for [place] PA-C [place] [address]  [place], [place] [address] [phone]   05/02/2019, 10:28 PM

## 2019-05-03 ENCOUNTER — Telehealth: Payer: Self-pay | Admitting: Physician Assistant

## 2019-05-03 NOTE — Telephone Encounter (Signed)
She can increase the requip each week to as high a dose as tolerated

## 2019-05-03 NOTE — Telephone Encounter (Signed)
Patient aware.

## 2019-05-03 NOTE — Telephone Encounter (Signed)
PT is wanting to let Leslie Hensley know that her legs are still cramping at night but its  Not as painful, states that Leslie Hensley may need to do something else or check her blood.

## 2019-05-03 NOTE — Telephone Encounter (Signed)
You can go to a total of 4 mg at night

## 2019-06-11 ENCOUNTER — Encounter: Payer: Self-pay | Admitting: Physician Assistant

## 2019-06-11 DIAGNOSIS — I8390 Asymptomatic varicose veins of unspecified lower extremity: Secondary | ICD-10-CM | POA: Insufficient documentation

## 2019-06-11 DIAGNOSIS — M8580 Other specified disorders of bone density and structure, unspecified site: Secondary | ICD-10-CM | POA: Insufficient documentation

## 2019-06-16 DIAGNOSIS — G4733 Obstructive sleep apnea (adult) (pediatric): Secondary | ICD-10-CM | POA: Diagnosis not present

## 2019-06-16 DIAGNOSIS — R0683 Snoring: Secondary | ICD-10-CM | POA: Diagnosis not present

## 2019-06-24 ENCOUNTER — Ambulatory Visit (INDEPENDENT_AMBULATORY_CARE_PROVIDER_SITE_OTHER): Payer: Medicare Other | Admitting: Family Medicine

## 2019-06-24 ENCOUNTER — Other Ambulatory Visit: Payer: Self-pay

## 2019-06-24 ENCOUNTER — Encounter: Payer: Self-pay | Admitting: Family Medicine

## 2019-06-24 VITALS — BP 123/70 | HR 74 | Temp 97.0°F | Ht 67.0 in | Wt 215.0 lb

## 2019-06-24 DIAGNOSIS — S91134A Puncture wound without foreign body of right lesser toe(s) without damage to nail, initial encounter: Secondary | ICD-10-CM | POA: Diagnosis not present

## 2019-06-24 DIAGNOSIS — L03031 Cellulitis of right toe: Secondary | ICD-10-CM

## 2019-06-24 DIAGNOSIS — T148XXA Other injury of unspecified body region, initial encounter: Secondary | ICD-10-CM

## 2019-06-24 DIAGNOSIS — Z23 Encounter for immunization: Secondary | ICD-10-CM

## 2019-06-24 MED ORDER — CEPHALEXIN 500 MG PO CAPS: 500.0000 mg | ORAL_CAPSULE | Freq: Four times a day (QID) | ORAL | 0 refills | Status: DC

## 2019-06-24 NOTE — Addendum Note (Signed)
Addended byCarrolyn Leigh on: 06/24/2019 02:16 PM   Modules accepted: Orders

## 2019-06-24 NOTE — Patient Instructions (Signed)

## 2019-06-24 NOTE — Progress Notes (Signed)
Subjective: CC: Swollen toe PCP: Terald Sleeper, PA-C ZWC:HENID E Freer is a 73 y.o. female presenting to clinic today for:  1.  Swollen toe Patient reports couple day history of swollen right sided pinky toe.  She notes that she was ambulating on some gravel and slipped and fell over.  She did sustain a puncture injury to the medial aspect of the pinky toe.  She has had a little bit of bruising along the adjacent toe but did not really have any discoloration into the last day or so of the pinky toe.  She notes redness, swelling and tenderness.  Denies any purulent discharge or fevers.  She has been buddy taping the toes and using Epsom salts soaks and efforts to treat.   ROS: Per HPI  Allergies  Allergen Reactions  . Codeine Hives  . Cyclobenzaprine Swelling  . Diclofenac Sodium Rash   Past Medical History:  Diagnosis Date  . Brain aneurysm   . Hyperlipidemia   . Hypertension   . Thyroid disease     Current Outpatient Medications:  .  aspirin EC 81 MG tablet, Take by mouth., Disp: , Rfl:  .  Cholecalciferol (VITAMIN D3) 25 MCG (1000 UT) CAPS, Take by mouth., Disp: , Rfl:  .  hydrochlorothiazide (HYDRODIURIL) 25 MG tablet, , Disp: , Rfl:  .  levothyroxine (SYNTHROID, LEVOTHROID) 75 MCG tablet, , Disp: , Rfl:  .  Omega-3 1000 MG CAPS, Take by mouth., Disp: , Rfl:  .  rOPINIRole (REQUIP) 0.25 MG tablet, Take 1-3 tablets (0.25-0.75 mg total) by mouth at bedtime., Disp: 60 tablet, Rfl: 2 .  simvastatin (ZOCOR) 20 MG tablet, , Disp: , Rfl:  .  Vitamin E 400 units TABS, Take by mouth., Disp: , Rfl:  Social History   Socioeconomic History  . Marital status: Married    Spouse name: Not on file  . Number of children: Not on file  . Years of education: Not on file  . Highest education level: Not on file  Occupational History  . Not on file  Social Needs  . Financial resource strain: Not on file  . Food insecurity    Worry: Not on file    Inability: Not on file  .  Transportation needs    Medical: Not on file    Non-medical: Not on file  Tobacco Use  . Smoking status: Former Research scientist (life sciences)  . Smokeless tobacco: Never Used  Substance and Sexual Activity  . Alcohol use: Not Currently  . Drug use: Not Currently  . Sexual activity: Not on file  Lifestyle  . Physical activity    Days per week: Not on file    Minutes per session: Not on file  . Stress: Not on file  Relationships  . Social Herbalist on phone: Not on file    Gets together: Not on file    Attends religious service: Not on file    Active member of club or organization: Not on file    Attends meetings of clubs or organizations: Not on file    Relationship status: Not on file  . Intimate partner violence    Fear of current or ex partner: Not on file    Emotionally abused: Not on file    Physically abused: Not on file    Forced sexual activity: Not on file  Other Topics Concern  . Not on file  Social History Narrative  . Not on file   Family History  Problem Relation Age of Onset  . Heart disease Mother   . Heart disease Father   . Stroke Father   . Heart disease Sister   . Heart disease Brother   . Diabetes Brother   . Heart disease Brother   . Diabetes Brother   . Stroke Brother   . Cancer Brother     Objective: Office vital signs reviewed. BP 123/70   Pulse 74   Temp (!) 97 F (36.1 C) (Oral)   Ht 5\' 7"  (1.702 m)   Wt 215 lb (97.5 kg)   BMI 33.67 kg/m   Physical Examination:  General: Awake, alert, well nourished, No acute distress MSK:  Right foot: Patient has moderate soft tissue swelling noted along the pinky toe.  She does have full active range of motion of all toes.  There is some increased warmth and erythema associated with the soft tissue swelling.  There is a visible punctum noted along the medial aspect of the pinky toe.  Light touch sensation is grossly intact.  Mild ecchymosis noted along the lateral aspect of the adjacent toe.  Assessment/  Plan: 73 y.o. female   1. Cellulitis of toe of right foot Treat with Keflex 4 times daily for 7 days.  Okay to continue Epsom salts soaks if she finds this helpful.  Keep area wrapped and protected.  We discussed red flag signs and symptoms.  If no significant improvement in symptoms she is to return.  May need to consider x-ray at that time. - cephALEXin (KEFLEX) 500 MG capsule; Take 1 capsule (500 mg total) by mouth 4 (four) times daily for 7 days.  Dispense: 28 capsule; Refill: 0  2. Puncture wound Tetanus shot was updated today.   No orders of the defined types were placed in this encounter.  No orders of the defined types were placed in this encounter.    Raliegh IpAshly M Gottschalk, DO [facility] [phone]

## 2019-06-28 ENCOUNTER — Other Ambulatory Visit: Payer: Self-pay | Admitting: Physician Assistant

## 2019-06-28 DIAGNOSIS — G4762 Sleep related leg cramps: Secondary | ICD-10-CM

## 2019-07-01 ENCOUNTER — Ambulatory Visit: Payer: Medicare Other | Admitting: Family

## 2019-07-01 ENCOUNTER — Encounter: Payer: Self-pay | Admitting: Family Medicine

## 2019-07-01 ENCOUNTER — Other Ambulatory Visit: Payer: Self-pay

## 2019-07-01 ENCOUNTER — Ambulatory Visit (INDEPENDENT_AMBULATORY_CARE_PROVIDER_SITE_OTHER): Payer: Medicare Other | Admitting: Family Medicine

## 2019-07-01 VITALS — BP 133/70 | HR 86 | Temp 97.8°F | Ht 67.0 in | Wt 214.6 lb

## 2019-07-01 DIAGNOSIS — L237 Allergic contact dermatitis due to plants, except food: Secondary | ICD-10-CM

## 2019-07-01 MED ORDER — PREDNISONE 20 MG PO TABS: ORAL_TABLET | ORAL | 0 refills | Status: DC

## 2019-07-01 NOTE — Progress Notes (Signed)
   BP 133/70   Pulse 86   Temp 97.8 F (36.6 C) (Oral)   Ht 5\' 7"  (1.702 m)   Wt 214 lb 9.6 oz (97.3 kg)   BMI 33.61 kg/m    Subjective:   Patient ID: Leslie Hensley, female    DOB: [birth year], 73 y.o.   MRN: [id number]  HPI: Leslie Hensley is a 73 y.o. female presenting on 07/01/2019 for [facility] (right hand and left side of face- x 4 days)   HPI Patient had a very pruritic rash that started up on the back of the right hand and then started coming up along her hairline on the forehead on both sides of her head that started like blisters and a few of them have burst now.  She did not know for sure what it came from but she has had poison ivy before and that is what she was told it looks like.  She has been using calamine lotion and some cream that her friend gave to her.  Relevant past medical, surgical, family and social history reviewed and updated as indicated. Interim medical history since our last visit reviewed. Allergies and medications reviewed and updated.  Review of Systems  Constitutional: Negative for chills and fever.  Eyes: Negative for visual disturbance.  Respiratory: Negative for chest tightness and shortness of breath.   Cardiovascular: Negative for chest pain and leg swelling.  Musculoskeletal: Negative for back pain and gait problem.  Skin: Positive for rash.  Neurological: Negative for light-headedness and headaches.  Psychiatric/Behavioral: Negative for agitation and behavioral problems.  All other systems reviewed and are negative.   Per HPI unless specifically indicated above      Objective:   BP 133/70   Pulse 86   Temp 97.8 F (36.6 C) (Oral)   Ht 5\' 7"  (1.702 m)   Wt 214 lb 9.6 oz (97.3 kg)   BMI 33.61 kg/m   Wt Readings from Last 3 Encounters:  07/01/19 214 lb 9.6 oz (97.3 kg)  06/24/19 215 lb (97.5 kg)  04/30/19 213 lb 3.2 oz (96.7 kg)    Physical Exam Vitals signs and nursing note reviewed.  Constitutional:      General: She is  not in acute distress.    Appearance: She is well-developed. She is not diaphoretic.  Eyes:     Conjunctiva/sclera: Conjunctivae normal.  Skin:    General: Skin is warm and dry.     Findings: Rash (Papular rash with small vesicles on the back of her right hand and along her hairline on both the left and right forehead) present.  Neurological:     Mental Status: She is alert and oriented to person, place, and time.     Coordination: Coordination normal.  Psychiatric:        Behavior: Behavior normal.       Assessment & Plan:   Problem List Items Addressed This Visit    None    Visit Diagnoses    Poison ivy dermatitis    -  Primary   Relevant Medications   predniSONE (DELTASONE) 20 MG tablet      Sent in prednisone for the patient Follow up plan: Return if symptoms worsen or fail to improve.  Counseling provided for all of the vaccine components No orders of the defined types were placed in this encounter.   Caryl Pina, MD [facility] 07/01/2019, 4:26 PM

## 2019-07-02 ENCOUNTER — Ambulatory Visit (INDEPENDENT_AMBULATORY_CARE_PROVIDER_SITE_OTHER): Payer: Medicare Other | Admitting: *Deleted

## 2019-07-02 ENCOUNTER — Telehealth: Payer: Self-pay | Admitting: Physician Assistant

## 2019-07-02 DIAGNOSIS — L237 Allergic contact dermatitis due to plants, except food: Secondary | ICD-10-CM

## 2019-07-02 MED ORDER — METHYLPREDNISOLONE ACETATE 80 MG/ML IJ SUSP
80.0000 mg | Freq: Once | INTRAMUSCULAR | Status: AC
  Administered 2019-07-02: 80 mg via INTRAMUSCULAR

## 2019-07-02 NOTE — Telephone Encounter (Signed)
Go ahead and give Depo-Medrol 80

## 2019-07-02 NOTE — Telephone Encounter (Signed)
Patient aware and appt made 

## 2019-07-02 NOTE — Telephone Encounter (Signed)
Please address if this would be ok.Leslie KitchenMarland Hensley

## 2019-07-05 DIAGNOSIS — G4733 Obstructive sleep apnea (adult) (pediatric): Secondary | ICD-10-CM | POA: Diagnosis not present

## 2019-07-09 ENCOUNTER — Ambulatory Visit (HOSPITAL_COMMUNITY)
Admission: RE | Admit: 2019-07-09 | Discharge: 2019-07-09 | Disposition: A | Discharge status: Home / Self Care | Payer: Medicare Other | Source: Ambulatory Visit | Attending: Physician Assistant | Admitting: Physician Assistant

## 2019-07-09 ENCOUNTER — Encounter (HOSPITAL_COMMUNITY): Payer: Self-pay

## 2019-07-09 ENCOUNTER — Other Ambulatory Visit: Payer: Self-pay

## 2019-07-09 ENCOUNTER — Other Ambulatory Visit: Payer: Self-pay | Admitting: Physician Assistant

## 2019-07-09 DIAGNOSIS — S99921S Unspecified injury of right foot, sequela: Secondary | ICD-10-CM | POA: Diagnosis not present

## 2019-07-09 DIAGNOSIS — S52571A Other intraarticular fracture of lower end of right radius, initial encounter for closed fracture: Secondary | ICD-10-CM | POA: Diagnosis not present

## 2019-07-09 NOTE — Progress Notes (Signed)
Right 5th toe injury a few weeks ago, still will a lot of pain, and toe is rotated

## 2019-08-03 DIAGNOSIS — G4733 Obstructive sleep apnea (adult) (pediatric): Secondary | ICD-10-CM | POA: Diagnosis not present

## 2019-08-31 DIAGNOSIS — M654 Radial styloid tenosynovitis [de Quervain]: Secondary | ICD-10-CM | POA: Diagnosis not present

## 2019-09-16 ENCOUNTER — Other Ambulatory Visit: Payer: Self-pay | Admitting: Physician Assistant

## 2019-09-16 DIAGNOSIS — G4762 Sleep related leg cramps: Secondary | ICD-10-CM

## 2019-09-24 DIAGNOSIS — Z0120 Encounter for dental examination and cleaning without abnormal findings: Secondary | ICD-10-CM | POA: Diagnosis not present

## 2019-09-27 ENCOUNTER — Telehealth: Payer: Self-pay | Admitting: Physician Assistant

## 2019-09-27 DIAGNOSIS — R911 Solitary pulmonary nodule: Secondary | ICD-10-CM

## 2019-09-27 NOTE — Telephone Encounter (Signed)
Please review in her Care Everywhere [facility] records, in 2018 or so, and there was an abnormalk scan needing follow up

## 2019-09-28 NOTE — Telephone Encounter (Signed)
Patient aware.

## 2019-09-28 NOTE — Telephone Encounter (Signed)
Patient had CT chest wo contrast 2018 at [facility] to follow up on lung nodules.  Recommended follow up in 1 year if has risk factor of for lung cancer.  CT scan ordered.

## 2019-09-28 NOTE — Telephone Encounter (Signed)
Thanks so much for doing that, does she know yet?

## 2019-09-29 DIAGNOSIS — Z9989 Dependence on other enabling machines and devices: Secondary | ICD-10-CM | POA: Diagnosis not present

## 2019-09-29 DIAGNOSIS — G4733 Obstructive sleep apnea (adult) (pediatric): Secondary | ICD-10-CM | POA: Diagnosis not present

## 2019-10-03 DIAGNOSIS — G4733 Obstructive sleep apnea (adult) (pediatric): Secondary | ICD-10-CM | POA: Diagnosis not present

## 2019-11-05 ENCOUNTER — Telehealth: Payer: Self-pay | Admitting: Physician Assistant

## 2019-11-05 NOTE — Telephone Encounter (Signed)
Mailbox not setup

## 2019-11-05 NOTE — Telephone Encounter (Signed)
[email or web address]

## 2019-11-05 NOTE — Telephone Encounter (Signed)
I think doing a referral to pulmonology would be helpful, and we tried to order the CT they decided from [place].  Is it okay for [place] to send her to a pulmonology specialist?  And we would do this through [facility], her preferred hospital.

## 2019-11-05 NOTE — Telephone Encounter (Signed)
Patient is aware and okay with going to pulmonologist at [place]

## 2019-11-07 ENCOUNTER — Other Ambulatory Visit: Payer: Self-pay | Admitting: Physician Assistant

## 2019-11-07 DIAGNOSIS — R911 Solitary pulmonary nodule: Secondary | ICD-10-CM

## 2019-11-07 NOTE — Telephone Encounter (Signed)
Completed order for [facility]

## 2019-11-24 ENCOUNTER — Ambulatory Visit: Payer: Medicare Other | Admitting: Physician Assistant

## 2019-11-30 DIAGNOSIS — Z1231 Encounter for screening mammogram for malignant neoplasm of breast: Secondary | ICD-10-CM | POA: Diagnosis not present

## 2019-12-01 ENCOUNTER — Other Ambulatory Visit: Payer: Self-pay

## 2019-12-02 ENCOUNTER — Encounter: Payer: Self-pay | Admitting: Physician Assistant

## 2019-12-02 ENCOUNTER — Ambulatory Visit (INDEPENDENT_AMBULATORY_CARE_PROVIDER_SITE_OTHER): Payer: Medicare Other | Admitting: Physician Assistant

## 2019-12-02 ENCOUNTER — Other Ambulatory Visit: Payer: Self-pay

## 2019-12-02 VITALS — BP 133/75 | HR 69 | Temp 96.8°F | Ht 64.0 in | Wt 216.0 lb

## 2019-12-02 DIAGNOSIS — Z20822 Contact with and (suspected) exposure to covid-19: Secondary | ICD-10-CM

## 2019-12-02 DIAGNOSIS — Z1159 Encounter for screening for other viral diseases: Secondary | ICD-10-CM

## 2019-12-02 DIAGNOSIS — R600 Localized edema: Secondary | ICD-10-CM | POA: Diagnosis not present

## 2019-12-02 MED ORDER — HYDROCHLOROTHIAZIDE 25 MG PO TABS: 25.0000 mg | ORAL_TABLET | Freq: Every day | ORAL | 5 refills | Status: DC

## 2019-12-02 NOTE — Patient Instructions (Signed)
Edema  Edema is when you have too much fluid in your body or under your skin. Edema may make your legs, feet, and ankles swell up. Swelling is also common in looser tissues, like around your eyes. This is a common condition. It gets more common as you get older. There are many possible causes of edema. Eating too much salt (sodium) and being on your feet or sitting for a long time can cause edema in your legs, feet, and ankles. Hot weather may make edema worse. Edema is usually painless. Your skin may look swollen or shiny. Follow these instructions at home:  Keep the swollen body part raised (elevated) above the level of your heart when you are sitting or lying down.  Do not sit still or stand for a long time.  Do not wear tight clothes. Do not wear garters on your upper legs.  Exercise your legs. This can help the swelling go down.  Wear elastic bandages or support stockings as told by your doctor.  Eat a low-salt (low-sodium) diet to reduce fluid as told by your doctor.  Depending on the cause of your swelling, you may need to limit how much fluid you drink (fluid restriction).  Take over-the-counter and prescription medicines only as told by your doctor. Contact a doctor if:  Treatment is not working.  You have heart, liver, or kidney disease and have symptoms of edema.  You have sudden and unexplained weight gain. Get help right away if:  You have shortness of breath or chest pain.  You cannot breathe when you lie down.  You have pain, redness, or warmth in the swollen areas.  You have heart, liver, or kidney disease and get edema all of a sudden.  You have a fever and your symptoms get worse all of a sudden. Summary  Edema is when you have too much fluid in your body or under your skin.  Edema may make your legs, feet, and ankles swell up. Swelling is also common in looser tissues, like around your eyes.  Raise (elevate) the swollen body part above the level of your  heart when you are sitting or lying down.  Follow your doctor's instructions about diet and how much fluid you can drink (fluid restriction). This information is not intended to replace advice given to you by your health care provider. Make sure you discuss any questions you have with your health care provider. Document Released: 06/03/2008 Document Revised: 12/19/2017 Document Reviewed: 01/03/2017 Elsevier Patient Education  2020 [employer] Inc[employer]  

## 2019-12-03 ENCOUNTER — Other Ambulatory Visit: Payer: Self-pay

## 2019-12-03 DIAGNOSIS — M8580 Other specified disorders of bone density and structure, unspecified site: Secondary | ICD-10-CM

## 2019-12-03 DIAGNOSIS — Z780 Asymptomatic menopausal state: Secondary | ICD-10-CM

## 2019-12-03 NOTE — Progress Notes (Signed)
BP 133/75   Pulse 69   Temp (!) 96.8 F (36 C) (Temporal)   Ht '5\' 4"'  (1.626 m)   Wt 216 lb (98 kg)   SpO2 95%   BMI 37.08 kg/m    Subjective:    Patient ID: Leslie Hensley, female    DOB: 10/12/1946, 73 y.o.   MRN: [id number]  HPI: Leslie Hensley is a 73 y.o. female presenting on 12/02/2019 for Edema  Had continued swelling in her lower legs.  She does do less activity than she used to.  Denies that her feet are in the lower area more.  Have encouraged her to wear her compression stockings.  She states everyone and they are down.  She has taken a fluid pill in the past.  But we are can have her continue on a regular basis.  I also encouraged her to put on her compression stockings that she has that are knee-high every morning.  And when she is sitting still to flex her legs as much as possible.  Past Medical History:  Diagnosis Date  . Brain aneurysm   . Hyperlipidemia   . Hypertension   . Thyroid disease    Relevant past medical, surgical, family and social history reviewed and updated as indicated. Interim medical history since our last visit reviewed. Allergies and medications reviewed and updated. DATA REVIEWED: CHART IN EPIC  Family History reviewed for pertinent findings.  Review of Systems  Constitutional: Negative.   HENT: Negative.   Eyes: Negative.   Respiratory: Negative.   Cardiovascular: Positive for leg swelling.  Gastrointestinal: Negative.   Genitourinary: Negative.     Allergies as of 12/02/2019      Reactions   Codeine Hives   Cyclobenzaprine Swelling   Diclofenac Sodium Rash      Medication List       Accurate as of December 02, 2019 11:59 PM. If you have any questions, ask your nurse or doctor.        STOP taking these medications   cephALEXin 500 MG capsule Commonly known as: KEFLEX Stopped by: Terald Sleeper, PA-C   predniSONE 20 MG tablet Commonly known as: DELTASONE Stopped by: Terald Sleeper, PA-C     TAKE these medications    aspirin EC 81 MG tablet Take by mouth.   hydrochlorothiazide 25 MG tablet Commonly known as: HYDRODIURIL Take 1-2 tablets (25-50 mg total) by mouth daily. What changed:   how much to take  how to take this  when to take this Changed by: Terald Sleeper, PA-C   levothyroxine 75 MCG tablet Commonly known as: SYNTHROID   Omega-3 1000 MG Caps Take by mouth.   rOPINIRole 0.25 MG tablet Commonly known as: REQUIP TAKE 1 TO 3 TABLETS AT BEDTIME   simvastatin 20 MG tablet Commonly known as: ZOCOR   Vitamin D3 25 MCG (1000 UT) Caps Take by mouth.   Vitamin E 400 units Tabs Take by mouth.          Objective:    BP 133/75   Pulse 69   Temp (!) 96.8 F (36 C) (Temporal)   Ht '5\' 4"'  (1.626 m)   Wt 216 lb (98 kg)   SpO2 95%   BMI 37.08 kg/m   Allergies  Allergen Reactions  . Codeine Hives  . Cyclobenzaprine Swelling  . Diclofenac Sodium Rash    Wt Readings from Last 3 Encounters:  12/02/19 216 lb (98 kg)  07/01/19 214 lb 9.6 oz (  97.3 kg)  06/24/19 215 lb (97.5 kg)    Physical Exam Constitutional:      General: She is not in acute distress.    Appearance: Normal appearance. She is well-developed.  HENT:     Head: Normocephalic and atraumatic.  Cardiovascular:     Rate and Rhythm: Normal rate.  Pulmonary:     Effort: Pulmonary effort is normal.  Musculoskeletal:     Right lower leg: 1+ Edema present.     Left lower leg: 1+ Edema present.  Skin:    General: Skin is warm and dry.     Findings: No rash.  Neurological:     Mental Status: She is alert and oriented to person, place, and time.     Deep Tendon Reflexes: Reflexes are normal and symmetric.     Results for orders placed or performed in visit on 04/30/19  CBC with Differential/Platelet  Result Value Ref Range   WBC 8.4 3.4 - 10.8 x10E3/uL   RBC 4.30 3.77 - 5.28 x10E6/uL   Hemoglobin 13.5 11.1 - 15.9 g/dL   Hematocrit 38.8 34.0 - 46.6 %   MCV 90 79 - 97 fL   MCH 31.4 26.6 - 33.0 pg   MCHC  34.8 31.5 - 35.7 g/dL   RDW 13.5 11.7 - 15.4 %   Platelets 298 150 - 450 x10E3/uL   Neutrophils 62 Not Estab. %   Lymphs 26 Not Estab. %   Monocytes 9 Not Estab. %   Eos 2 Not Estab. %   Basos 1 Not Estab. %   Neutrophils Absolute 5.2 1.4 - 7.0 x10E3/uL   Lymphocytes Absolute 2.2 0.7 - 3.1 x10E3/uL   Monocytes Absolute 0.8 0.1 - 0.9 x10E3/uL   EOS (ABSOLUTE) 0.2 0.0 - 0.4 x10E3/uL   Basophils Absolute 0.1 0.0 - 0.2 x10E3/uL   Immature Granulocytes 0 Not Estab. %   Immature Grans (Abs) 0.0 0.0 - 0.1 x10E3/uL  CMP14+EGFR  Result Value Ref Range   Glucose 82 65 - 99 mg/dL   BUN 14 8 - 27 mg/dL   Creatinine, Ser 0.78 0.57 - 1.00 mg/dL   GFR calc non Af Amer 77 >59 mL/min/1.73   GFR calc Af Amer 88 >59 mL/min/1.73   BUN/Creatinine Ratio 18 12 - 28   Sodium 140 134 - 144 mmol/L   Potassium 3.7 3.5 - 5.2 mmol/L   Chloride 102 96 - 106 mmol/L   CO2 25 20 - 29 mmol/L   Calcium 9.1 8.7 - 10.3 mg/dL   Total Protein 6.9 6.0 - 8.5 g/dL   Albumin 4.0 3.7 - 4.7 g/dL   Globulin, Total 2.9 1.5 - 4.5 g/dL   Albumin/Globulin Ratio 1.4 1.2 - 2.2   Bilirubin Total 0.4 0.0 - 1.2 mg/dL   Alkaline Phosphatase 86 39 - 117 IU/L   AST 14 0 - 40 IU/L   ALT 19 0 - 32 IU/L  TSH  Result Value Ref Range   TSH 2.010 0.450 - 4.500 uIU/mL  Magnesium  Result Value Ref Range   Magnesium 2.0 1.6 - 2.3 mg/dL      Assessment & Plan:   1. Localized edema Reduce sodium, wear compression garment, Elevate legs nightly - hydrochlorothiazide (HYDRODIURIL) 25 MG tablet; Take 1-2 tablets (25-50 mg total) by mouth daily.  Dispense: 60 tablet; Refill: 5   Continue all other maintenance medications as listed above.  Follow up plan: Return in about 3 months (around 03/01/2020).  Educational handout given for edema  [employer].  [facility] [address]  [place], [place] [address] [phone]   12/03/2019, 3:04 PM

## 2019-12-04 LAB — NOVEL CORONAVIRUS, NAA: SARS-CoV-2, NAA: DETECTED — AB

## 2019-12-05 ENCOUNTER — Telehealth: Payer: Self-pay | Admitting: Nurse Practitioner

## 2019-12-05 NOTE — Telephone Encounter (Signed)
  I connected by phone with Leslie Hensley on 12/05/2019 at 10:14 AM to discuss the potential use of an new treatment for mild to moderate COVID-19 viral infection in non-hospitalized patients.  Her symptoms started on 11/30/19 and her positive test results returned yesterday.  Discussed with her at length self isolation guidelines and that if worsening symptoms such as SOB or difficulty breathing to seek care immediately.  At this time she reports her symptoms are improving.  This patient is a 73 y.o. female that meets the FDA criteria for Emergency Use Authorization of bamlanivimab:  Has a (+) direct SARS-CoV-2 viral test result  Has mild or moderate COVID-19   Is ? 72 years of age and weighs ? 40 kg  Is NOT hospitalized due to COVID-19  Is NOT requiring oxygen therapy or requiring an increase in baseline oxygen flow rate due to COVID-19  Is within 10 days of symptom onset  Has at least one of the high risk factor(s) for progression to severe COVID-19 and/or hospitalization as defined in EUA.  Specific high risk criteria : >/= 72 yo  Reviewed patient chronic problem list, which is currently managed by their PCP.  I have spoken and communicated the following to the patient or parent/caregiver:  1. FDA has authorized the emergency use of bamlanivimab for the treatment of mild to moderate COVID-19 in adults and pediatric patients with positive results of direct SARS-CoV-2 viral testing who are 8 years of age and older weighing at least 40 kg, and who are at high risk for progressing to severe COVID-19 and/or hospitalization.  2. The significant known and potential risks and benefits of bamlanivimab, and the extent to which such potential risks and benefits are unknown.  3. Information on available alternative treatments and the risks and benefits of those alternatives, including clinical trials.  4. Patients treated with bamlanivimab should continue to self-isolate and use infection  control measures (e.g., wear mask, isolate, social distance, avoid sharing personal items, clean and disinfect "high touch" surfaces, and frequent handwashing) according to CDC guidelines.   5. The patient or parent/caregiver has the option to accept or refuse bamlanivimab.  After reviewing this information with the patient, The patient has DECLINED offer to receive the infusion.  Leslie Hensley 12/05/2019 10:14 AM

## 2019-12-08 ENCOUNTER — Ambulatory Visit: Payer: Medicare Other

## 2019-12-13 DIAGNOSIS — M8588 Other specified disorders of bone density and structure, other site: Secondary | ICD-10-CM | POA: Diagnosis not present

## 2019-12-13 DIAGNOSIS — Z780 Asymptomatic menopausal state: Secondary | ICD-10-CM | POA: Diagnosis not present

## 2019-12-21 ENCOUNTER — Ambulatory Visit (INDEPENDENT_AMBULATORY_CARE_PROVIDER_SITE_OTHER): Payer: Medicare Other | Admitting: *Deleted

## 2019-12-21 DIAGNOSIS — Z0000 Encounter for general adult medical examination without abnormal findings: Secondary | ICD-10-CM

## 2019-12-21 NOTE — Patient Instructions (Signed)
Preventive Care 72 Years and Older, Female Preventive care refers to lifestyle choices and visits with your health care provider that can promote health and wellness. This includes:  A yearly physical exam. This is also called an annual well check.  Regular dental and eye exams.  Immunizations.  Screening for certain conditions.  Healthy lifestyle choices, such as diet and exercise. What can I expect for my preventive care visit? Physical exam Your health care provider will check:  Height and weight. These may be used to calculate body mass index (BMI), which is a measurement that tells if you are at a healthy weight.  Heart rate and blood pressure.  Your skin for abnormal spots. Counseling Your health care provider may ask you questions about:  Alcohol, tobacco, and drug use.  Emotional well-being.  Home and relationship well-being.  Sexual activity.  Eating habits.  History of falls.  Memory and ability to understand (cognition).  Work and work Statistician.  Pregnancy and menstrual history. What immunizations do I need?  Influenza (flu) vaccine  This is recommended every year. Tetanus, diphtheria, and pertussis (Tdap) vaccine  You may need a Td booster every 10 years. Varicella (chickenpox) vaccine  You may need this vaccine if you have not already been vaccinated. Zoster (shingles) vaccine  You may need this after age 72. Pneumococcal conjugate (PCV13) vaccine  One dose is recommended after age 72. Pneumococcal polysaccharide (PPSV23) vaccine  One dose is recommended after age 72. Measles, mumps, and rubella (MMR) vaccine  You may need at least one dose of MMR if you were born in 1957 or later. You may also need a second dose. Meningococcal conjugate (MenACWY) vaccine  You may need this if you have certain conditions. Hepatitis A vaccine  You may need this if you have certain conditions or if you travel or work in places where you may be exposed  to hepatitis A. Hepatitis B vaccine  You may need this if you have certain conditions or if you travel or work in places where you may be exposed to hepatitis B. Haemophilus influenzae type b (Hib) vaccine  You may need this if you have certain conditions. You may receive vaccines as individual doses or as more than one vaccine together in one shot (combination vaccines). Talk with your health care provider about the risks and benefits of combination vaccines. What tests do I need? Blood tests  Lipid and cholesterol levels. These may be checked every 5 years, or more frequently depending on your overall health.  Hepatitis C test.  Hepatitis B test. Screening  Lung cancer screening. You may have this screening every year starting at age 72 if you have a 30-pack-year history of smoking and currently smoke or have quit within the past 15 years.  Colorectal cancer screening. All adults should have this screening starting at age 72 and continuing until age 72. Your health care provider may recommend screening at age 23 if you are at increased risk. You will have tests every 1-10 years, depending on your results and the type of screening test.  Diabetes screening. This is done by checking your blood sugar (glucose) after you have not eaten for a while (fasting). You may have this done every 1-3 years.  Mammogram. This may be done every 1-2 years. Talk with your health care provider about how often you should have regular mammograms.  BRCA-related cancer screening. This may be done if you have a family history of breast, ovarian, tubal, or peritoneal cancers.  Other tests  Sexually transmitted disease (STD) testing.  Bone density scan. This is done to screen for osteoporosis. You may have this done starting at age 72. Follow these instructions at home: Eating and drinking  Eat a diet that includes fresh fruits and vegetables, whole grains, lean protein, and low-fat dairy products. Limit  your intake of foods with high amounts of sugar, saturated fats, and salt.  Take vitamin and mineral supplements as recommended by your health care provider.  Do not drink alcohol if your health care provider tells you not to drink.  If you drink alcohol: ? Limit how much you have to 0-1 drink a day. ? Be aware of how much alcohol is in your drink. In the [place] one drink equals one 12 oz bottle of beer (355 mL), one 5 oz glass of wine (148 mL), or one 1 oz glass of hard liquor (44 mL). Lifestyle  Take daily care of your teeth and gums.  Stay active. Exercise for at least 30 minutes on 5 or more days each week.  Do not use any products that contain nicotine or tobacco, such as cigarettes, e-cigarettes, and chewing tobacco. If you need help quitting, ask your health care provider.  If you are sexually active, practice safe sex. Use a condom or other form of protection in order to prevent STIs (sexually transmitted infections).  Talk with your health care provider about taking a low-dose aspirin or statin. What's next?  Go to your health care provider once a year for a well check visit.  Ask your health care provider how often you should have your eyes and teeth checked.  Stay up to date on all vaccines. This information is not intended to replace advice given to you by your health care provider. Make sure you discuss any questions you have with your health care provider. Document Released: 01/12/2016 Document Revised: 12/10/2018 Document Reviewed: 12/10/2018 Elsevier Patient Education  2020 [employer].

## 2019-12-21 NOTE — Progress Notes (Signed)
MEDICARE ANNUAL WELLNESS VISIT  12/21/2019  Telephone Visit Disclaimer This Medicare AWV was conducted by telephone due to national recommendations for restrictions regarding the COVID-19 Pandemic (e.g. social distancing).  I verified, using two identifiers, that I am speaking with Leslie Hensley or their authorized healthcare agent. I discussed the limitations, risks, security, and privacy concerns of performing an evaluation and management service by telephone and the potential availability of an in-person appointment in the future. The patient expressed understanding and agreed to proceed.   Subjective:  Leslie Hensley is a 73 y.o. female patient of Leslie Sleeper, PA-C who had a Medicare Annual Wellness Visit today via telephone. 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 she has 3 children. she reports that she is socially active and does interact with friends/family regularly. she is moderately physically active and enjoys going out to dinner with her son, playing cornhole, spending time with her family.  Patient Care Team: Theodoro Clock as PCP - General (Physician Assistant)  Advanced Directives 12/21/2019  Does Patient Have a Medical Advance Directive? No  Would patient like information on creating a medical advance directive? No - Patient declined    Hospital Utilization Over the Past 12 Months: # of hospitalizations or ER visits: 0 # of surgeries: 0  Review of Systems    Patient reports that her overall health is unchanged compared to last year.  History obtained from chart review  Patient Reported Readings (BP, Pulse, CBG, Weight, etc) none  Pain Assessment Pain : No/denies pain     Current Medications & Allergies (verified) Allergies as of 12/21/2019      Reactions   Codeine Hives   Cyclobenzaprine Swelling   Diclofenac Sodium Rash      Medication List       Accurate as of December 21, 2019  9:37 AM. If you have any questions, ask your nurse or doctor.        aspirin EC 81 MG tablet Take by mouth.   hydrochlorothiazide 25 MG tablet Commonly known as: HYDRODIURIL Take 1-2 tablets (25-50 mg total) by mouth daily.   levothyroxine 75 MCG tablet Commonly known as: SYNTHROID   Omega-3 1000 MG Caps Take by mouth.   rOPINIRole 0.25 MG tablet Commonly known as: REQUIP TAKE 1 TO 3 TABLETS AT BEDTIME   simvastatin 20 MG tablet Commonly known as: ZOCOR   Vitamin D3 25 MCG (1000 UT) Caps Take by mouth.   Vitamin E 400 units Tabs Take by mouth.       History (reviewed): Past Medical History:  Diagnosis Date  . Brain aneurysm   . Hyperlipidemia   . Hypertension   . Thyroid disease    Past Surgical History:  Procedure Laterality Date  . ABDOMINAL HYSTERECTOMY    . BRAIN SURGERY     aneurysm  . FOOT SURGERY Left    spur removed  . KIDNEY STONE SURGERY     Family History  Problem Relation Age of Onset  . Heart disease Mother   . Heart disease Father   . Stroke Father   . Heart disease Sister   . Heart disease Brother   . Diabetes Brother   . Heart disease Brother   . Diabetes Brother   . Stroke Brother   . Cancer Brother    Social History   Socioeconomic History  . Marital status: Divorced  Spouse name: Not on file  . Number of children: 3  . Years of education: 10  . Highest education level: 10th grade  Occupational History  . Not on file  Tobacco Use  . Smoking status: Former Smoker    Types: Cigarettes    Quit date: 12/21/1999    Years since quitting: 20.0  . Smokeless tobacco: Never Used  Substance and Sexual Activity  . Alcohol use: Not Currently  . Drug use: Not Currently  . Sexual activity: Not Currently    Birth control/protection: Surgical  Other Topics Concern  . Not on file  Social History Narrative  . Not on file   Social Determinants of Health   Financial Resource Strain: Low Risk   . Difficulty of Paying  Living Expenses: Not hard at all  Food Insecurity: No Food Insecurity  . Worried About Programme researcher, broadcasting/film/video in the Last Year: Never true  . Ran Out of Food in the Last Year: Never true  Transportation Needs: No Transportation Needs  . Lack of Transportation (Medical): No  . Lack of Transportation (Non-Medical): No  Physical Activity: Insufficiently Active  . Days of Exercise per Week: 3 days  . Minutes of Exercise per Session: 40 min  Stress: No Stress Concern Present  . Feeling of Stress : Not at all  Social Connections: Slightly Isolated  . Frequency of Communication with Friends and Family: More than three times a week  . Frequency of Social Gatherings with Friends and Family: More than three times a week  . Attends Religious Services: More than 4 times per year  . Active Member of Clubs or Organizations: Yes  . Attends Banker Meetings: More than 4 times per year  . Marital Status: Divorced    Activities of Daily Living In your present state of health, do you have any difficulty performing the following activities: 12/21/2019  Hearing? N  Vision? N  Comment wears otc reading glasses for fine print-last eye exam 1 year ago  Difficulty concentrating or making decisions? Y  Comment has noticed she has more trouble remembering peoples names  Walking or climbing stairs? N  Dressing or bathing? N  Doing errands, shopping? N  Preparing Food and eating ? N  Using the Toilet? N  In the past six months, have you accidently leaked urine? Y  Comment stress incontinence-wears small pad  Do you have problems with loss of bowel control? N  Managing your Medications? N  Managing your Finances? N  Housekeeping or managing your Housekeeping? N  Some recent data might be hidden    Patient Education/ Literacy How often do you need to have someone help you when you read instructions, pamphlets, or other written materials from your doctor or pharmacy?: 1 - Never What is the last  grade level you completed in school?: 10th grade  Exercise Current Exercise Habits: Home exercise routine, Type of exercise: walking;treadmill, Time (Minutes): 40, Frequency (Times/Week): 3, Weekly Exercise (Minutes/Week): 120, Intensity: Mild, Exercise limited by: None identified  Diet Patient reports consuming 3 meals a day and 3 snack(s) a day Patient reports that her primary diet is: Regular Patient reports that she does have regular access to food.   Depression Screen PHQ 2/9 Scores 12/21/2019 12/02/2019 06/24/2019 04/30/2019  PHQ - 2 Score 0 0 0 0  PHQ- 9 Score - - 0 -     Fall Risk Fall Risk  12/21/2019 12/02/2019 06/24/2019 04/30/2019  Falls in the past year? 1 1  0 0  Number falls in past yr: 0 0 - -  Injury with Fall? 0 1 - -     Objective:  Eduard RouxNancy E Rom seemed alert and oriented and she participated appropriately during our telephone visit.  Blood Pressure Weight BMI  BP Readings from Last 3 Encounters:  12/02/19 133/75  07/01/19 133/70  06/24/19 123/70   Wt Readings from Last 3 Encounters:  12/02/19 216 lb (98 kg)  07/01/19 214 lb 9.6 oz (97.3 kg)  06/24/19 215 lb (97.5 kg)   BMI Readings from Last 1 Encounters:  12/02/19 37.08 kg/m    *Unable to obtain current vital signs, weight, and BMI due to telephone visit type  Hearing/Vision  . Harriett Sineancy did not seem to have difficulty with hearing/understanding during the telephone conversation . Reports that she has not had a formal eye exam by an eye care professional within the past year . Reports that she has not had a formal hearing evaluation within the past year *Unable to fully assess hearing and vision during telephone visit type  Cognitive Function: 6CIT Screen 12/21/2019  What Year? 0 points  What month? 0 points  What time? 0 points  Count back from 20 0 points  Months in reverse 2 points  Repeat phrase 0 points  Total Score 2   (Normal:0-7, Significant for Dysfunction: >8)  Normal Cognitive Function  Screening: Yes   Immunization & Health Maintenance Record Immunization History  Administered Date(s) Administered  . Td 06/24/2019    Health Maintenance  Topic Date Due  . Hepatitis C Screening  03/09/1946  . INFLUENZA VACCINE  03/29/2020 (Originally 07/31/2019)  . COLONOSCOPY  12/01/2020 (Originally 07/07/1997)  . PNA vac Low Risk Adult (1 of 2 - PCV13) 12/01/2020 (Originally 07/07/2012)  . MAMMOGRAM  11/29/2021  . TETANUS/TDAP  06/23/2029  . DEXA SCAN  Completed       Assessment  This is a routine wellness examination for Eduard RouxNancy E Barbary.  Health Maintenance: Due or Overdue Health Maintenance Due  Topic Date Due  . Hepatitis C Screening  03/09/1946    Eduard RouxNancy E Zietlow does not need a referral for Community Assistance: Care Management:   no Social Work:    no Prescription Assistance:  no Nutrition/Diabetes Education:  no   Plan:  Personalized Goals Goals Addressed            This Visit's Progress   . DIET - INCREASE WATER INTAKE       Try to drink 6-8 glasses of water daily      Personalized Health Maintenance & Screening Recommendations  Pneumococcal vaccine  Influenza vaccine Shingles vaccine  Lung Cancer Screening Recommended: no (Low Dose CT Chest recommended if Age 43-80 years, 30 pack-year currently smoking OR have quit w/in past 15 years) Hepatitis C Screening recommended: yes HIV Screening recommended: no  Advanced Directives: Written information was not prepared per patient's request.  Referrals & Orders No orders of the defined types were placed in this encounter.   Follow-up Plan . Follow-up with Remus LofflerJones, Angel S, PA-C as planned . Consider Flu, Prevnar and Shingles vaccines at your next visit with your PCP   I have personally reviewed and noted the following in the patient's chart:   . Medical and social history . Use of alcohol, tobacco or illicit drugs  . Current medications and supplements . Functional ability and status . Nutritional  status . Physical activity . Advanced directives . List of other physicians . Hospitalizations, surgeries, and ER  visits in previous 12 months . Vitals . Screenings to include cognitive, depression, and falls . Referrals and appointments  In addition, I have reviewed and discussed with [name] certain preventive protocols, quality metrics, and best practice recommendations. A written personalized care plan for preventive services as well as general preventive health recommendations is available and can be mailed to the patient at her request.      Hessie Diener, LPN  16/09/[id number]

## 2020-01-04 ENCOUNTER — Other Ambulatory Visit: Payer: Self-pay | Admitting: Physician Assistant

## 2020-01-04 DIAGNOSIS — G4762 Sleep related leg cramps: Secondary | ICD-10-CM

## 2020-01-18 DIAGNOSIS — Z20822 Contact with and (suspected) exposure to covid-19: Secondary | ICD-10-CM | POA: Diagnosis not present

## 2020-01-19 DIAGNOSIS — R911 Solitary pulmonary nodule: Secondary | ICD-10-CM | POA: Diagnosis not present

## 2020-01-19 DIAGNOSIS — R918 Other nonspecific abnormal finding of lung field: Secondary | ICD-10-CM | POA: Diagnosis not present

## 2020-01-27 DIAGNOSIS — R911 Solitary pulmonary nodule: Secondary | ICD-10-CM | POA: Diagnosis not present

## 2020-01-27 DIAGNOSIS — R918 Other nonspecific abnormal finding of lung field: Secondary | ICD-10-CM | POA: Diagnosis not present

## 2020-01-31 DIAGNOSIS — Z9889 Other specified postprocedural states: Secondary | ICD-10-CM | POA: Diagnosis not present

## 2020-01-31 DIAGNOSIS — S0003XA Contusion of scalp, initial encounter: Secondary | ICD-10-CM | POA: Diagnosis not present

## 2020-01-31 DIAGNOSIS — F419 Anxiety disorder, unspecified: Secondary | ICD-10-CM | POA: Diagnosis not present

## 2020-01-31 DIAGNOSIS — M25511 Pain in right shoulder: Secondary | ICD-10-CM | POA: Diagnosis not present

## 2020-01-31 DIAGNOSIS — S06360A Traumatic hemorrhage of cerebrum, unspecified, without loss of consciousness, initial encounter: Secondary | ICD-10-CM | POA: Diagnosis not present

## 2020-01-31 DIAGNOSIS — I10 Essential (primary) hypertension: Secondary | ICD-10-CM | POA: Diagnosis not present

## 2020-01-31 DIAGNOSIS — S02609A Fracture of mandible, unspecified, initial encounter for closed fracture: Secondary | ICD-10-CM | POA: Diagnosis not present

## 2020-01-31 DIAGNOSIS — G2581 Restless legs syndrome: Secondary | ICD-10-CM | POA: Diagnosis not present

## 2020-01-31 DIAGNOSIS — S02641A Fracture of ramus of right mandible, initial encounter for closed fracture: Secondary | ICD-10-CM | POA: Diagnosis not present

## 2020-01-31 DIAGNOSIS — E039 Hypothyroidism, unspecified: Secondary | ICD-10-CM | POA: Diagnosis not present

## 2020-01-31 DIAGNOSIS — M25521 Pain in right elbow: Secondary | ICD-10-CM | POA: Diagnosis not present

## 2020-01-31 DIAGNOSIS — F411 Generalized anxiety disorder: Secondary | ICD-10-CM | POA: Diagnosis not present

## 2020-01-31 DIAGNOSIS — D62 Acute posthemorrhagic anemia: Secondary | ICD-10-CM | POA: Diagnosis not present

## 2020-01-31 DIAGNOSIS — R471 Dysarthria and anarthria: Secondary | ICD-10-CM | POA: Diagnosis not present

## 2020-01-31 DIAGNOSIS — Z87891 Personal history of nicotine dependence: Secondary | ICD-10-CM | POA: Diagnosis not present

## 2020-01-31 DIAGNOSIS — E785 Hyperlipidemia, unspecified: Secondary | ICD-10-CM | POA: Diagnosis not present

## 2020-01-31 DIAGNOSIS — Z8679 Personal history of other diseases of the circulatory system: Secondary | ICD-10-CM | POA: Diagnosis not present

## 2020-01-31 DIAGNOSIS — Z041 Encounter for examination and observation following transport accident: Secondary | ICD-10-CM | POA: Diagnosis not present

## 2020-01-31 DIAGNOSIS — J984 Other disorders of lung: Secondary | ICD-10-CM | POA: Diagnosis not present

## 2020-01-31 DIAGNOSIS — Z79890 Hormone replacement therapy (postmenopausal): Secondary | ICD-10-CM | POA: Diagnosis not present

## 2020-01-31 DIAGNOSIS — R531 Weakness: Secondary | ICD-10-CM | POA: Diagnosis not present

## 2020-01-31 DIAGNOSIS — S065X9A Traumatic subdural hemorrhage with loss of consciousness of unspecified duration, initial encounter: Secondary | ICD-10-CM | POA: Diagnosis not present

## 2020-01-31 DIAGNOSIS — S069X0D Unspecified intracranial injury without loss of consciousness, subsequent encounter: Secondary | ICD-10-CM | POA: Diagnosis not present

## 2020-01-31 DIAGNOSIS — R0689 Other abnormalities of breathing: Secondary | ICD-10-CM | POA: Diagnosis not present

## 2020-01-31 DIAGNOSIS — Z9119 Patient's noncompliance with other medical treatment and regimen: Secondary | ICD-10-CM | POA: Diagnosis not present

## 2020-01-31 DIAGNOSIS — S02601A Fracture of unspecified part of body of right mandible, initial encounter for closed fracture: Secondary | ICD-10-CM | POA: Diagnosis not present

## 2020-01-31 DIAGNOSIS — Z7982 Long term (current) use of aspirin: Secondary | ICD-10-CM | POA: Diagnosis not present

## 2020-01-31 DIAGNOSIS — Z79899 Other long term (current) drug therapy: Secondary | ICD-10-CM | POA: Diagnosis not present

## 2020-01-31 DIAGNOSIS — S02602A Fracture of unspecified part of body of left mandible, initial encounter for closed fracture: Secondary | ICD-10-CM | POA: Diagnosis not present

## 2020-01-31 DIAGNOSIS — Z90710 Acquired absence of both cervix and uterus: Secondary | ICD-10-CM | POA: Diagnosis not present

## 2020-01-31 DIAGNOSIS — M898X1 Other specified disorders of bone, shoulder: Secondary | ICD-10-CM | POA: Diagnosis not present

## 2020-01-31 DIAGNOSIS — S065X0A Traumatic subdural hemorrhage without loss of consciousness, initial encounter: Secondary | ICD-10-CM | POA: Diagnosis not present

## 2020-01-31 DIAGNOSIS — S02651A Fracture of angle of right mandible, initial encounter for closed fracture: Secondary | ICD-10-CM | POA: Diagnosis not present

## 2020-02-10 DIAGNOSIS — S02651D Fracture of angle of right mandible, subsequent encounter for fracture with routine healing: Secondary | ICD-10-CM | POA: Insufficient documentation

## 2020-02-10 DIAGNOSIS — S02602D Fracture of unspecified part of body of left mandible, subsequent encounter for fracture with routine healing: Secondary | ICD-10-CM | POA: Diagnosis not present

## 2020-02-10 DIAGNOSIS — S02641D Fracture of ramus of right mandible, subsequent encounter for fracture with routine healing: Secondary | ICD-10-CM | POA: Diagnosis not present

## 2020-02-15 ENCOUNTER — Other Ambulatory Visit: Payer: Self-pay | Admitting: Physician Assistant

## 2020-02-15 ENCOUNTER — Telehealth: Payer: Self-pay | Admitting: Physician Assistant

## 2020-02-15 DIAGNOSIS — R600 Localized edema: Secondary | ICD-10-CM

## 2020-02-15 MED ORDER — LEVOTHYROXINE SODIUM 75 MCG PO TABS: 75.0000 ug | ORAL_TABLET | Freq: Every day | ORAL | 3 refills | Status: DC

## 2020-02-15 MED ORDER — SIMVASTATIN 20 MG PO TABS: 20.0000 mg | ORAL_TABLET | Freq: Every day | ORAL | 3 refills | Status: DC

## 2020-02-15 MED ORDER — LEVOTHYROXINE SODIUM 75 MCG PO TABS: 75.0000 ug | ORAL_TABLET | Freq: Every day | ORAL | 0 refills | Status: DC

## 2020-02-15 MED ORDER — HYDROCHLOROTHIAZIDE 25 MG PO TABS: 25.0000 mg | ORAL_TABLET | Freq: Every day | ORAL | 0 refills | Status: DC

## 2020-02-15 MED ORDER — HYDROCHLOROTHIAZIDE 25 MG PO TABS: 25.0000 mg | ORAL_TABLET | Freq: Every day | ORAL | 11 refills | Status: DC

## 2020-02-15 MED ORDER — SIMVASTATIN 20 MG PO TABS: 20.0000 mg | ORAL_TABLET | Freq: Every day | ORAL | 0 refills | Status: DC

## 2020-02-15 MED ORDER — ALPRAZOLAM 0.25 MG PO TABS: 0.2500 mg | ORAL_TABLET | Freq: Every evening | ORAL | 0 refills | Status: DC | PRN

## 2020-02-15 NOTE — Telephone Encounter (Signed)
Pt aware but states the rx were supposed to be sent to [facility]. Advised everything but the xanax can be sent and to cancel the other rx's at [facility]. Levothyroxine, simvastatin and hctz sent to [facility] per pt request.

## 2020-02-15 NOTE — Telephone Encounter (Signed)
Sent a prescription for alprazolam 0.25 mg 1 at bedtime.  I also refilled her simvastatin and levothyroxine.  She already had refills at the pharmacy for the hydrochlorothiazide.

## 2020-02-15 NOTE — Telephone Encounter (Signed)
Yecenia Dalgleish is calling about patient.  Ms. Mikowski son has passed away and she is having problems sleeping so they are asking if you can prescribe something to help her sleep.  Also, she needs refills on HCTZ, Levothyroxine and Simvastatin.  He really wants to talk to you but there are no appointments available with you today.  Please advise.

## 2020-02-15 NOTE — Telephone Encounter (Signed)
No answer, mailbox full

## 2020-02-16 DIAGNOSIS — Z4802 Encounter for removal of sutures: Secondary | ICD-10-CM | POA: Diagnosis not present

## 2020-02-21 ENCOUNTER — Other Ambulatory Visit: Payer: Self-pay | Admitting: *Deleted

## 2020-02-21 DIAGNOSIS — G4762 Sleep related leg cramps: Secondary | ICD-10-CM

## 2020-02-21 NOTE — Telephone Encounter (Signed)
Jones. NTBS 30 days given 01/05/20 

## 2020-02-22 NOTE — Telephone Encounter (Signed)
Appointment scheduled.

## 2020-03-03 ENCOUNTER — Other Ambulatory Visit: Payer: Self-pay | Admitting: Physician Assistant

## 2020-03-03 DIAGNOSIS — G4762 Sleep related leg cramps: Secondary | ICD-10-CM

## 2020-03-03 NOTE — Telephone Encounter (Signed)
Ov 03/08/20

## 2020-03-06 DIAGNOSIS — S065X9D Traumatic subdural hemorrhage with loss of consciousness of unspecified duration, subsequent encounter: Secondary | ICD-10-CM | POA: Diagnosis not present

## 2020-03-06 DIAGNOSIS — S065X9A Traumatic subdural hemorrhage with loss of consciousness of unspecified duration, initial encounter: Secondary | ICD-10-CM | POA: Diagnosis not present

## 2020-03-06 DIAGNOSIS — Z9889 Other specified postprocedural states: Secondary | ICD-10-CM | POA: Diagnosis not present

## 2020-03-07 ENCOUNTER — Other Ambulatory Visit: Payer: Self-pay

## 2020-03-08 ENCOUNTER — Encounter: Payer: Self-pay | Admitting: Family Medicine

## 2020-03-08 ENCOUNTER — Ambulatory Visit (INDEPENDENT_AMBULATORY_CARE_PROVIDER_SITE_OTHER): Payer: Medicare Other | Admitting: Family Medicine

## 2020-03-08 ENCOUNTER — Ambulatory Visit: Payer: Medicare Other | Admitting: Physician Assistant

## 2020-03-08 VITALS — BP 104/57 | HR 80 | Temp 96.8°F | Resp 20 | Ht 64.0 in | Wt 210.0 lb

## 2020-03-08 DIAGNOSIS — Z79899 Other long term (current) drug therapy: Secondary | ICD-10-CM | POA: Insufficient documentation

## 2020-03-08 DIAGNOSIS — F418 Other specified anxiety disorders: Secondary | ICD-10-CM | POA: Insufficient documentation

## 2020-03-08 DIAGNOSIS — F5101 Primary insomnia: Secondary | ICD-10-CM | POA: Diagnosis not present

## 2020-03-08 MED ORDER — ALPRAZOLAM 0.25 MG PO TABS: 0.2500 mg | ORAL_TABLET | Freq: Every evening | ORAL | 0 refills | Status: AC | PRN
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
## 2020-03-08 NOTE — Patient Instructions (Signed)
Alprazolam tablets What is this medicine? ALPRAZOLAM (al PRAY zoe lam) is a benzodiazepine. It is used to treat anxiety and panic attacks. This medicine may be used for other purposes; ask your health care provider or pharmacist if you have questions. COMMON BRAND NAME(S): Xanax What should I tell my health care provider before I take this medicine? They need to know if you have any of these conditions:  an alcohol or drug abuse problem  bipolar disorder, depression, psychosis or other mental health conditions  glaucoma  kidney or liver disease  lung or breathing disease  myasthenia gravis  Parkinson's disease  porphyria  seizures or a history of seizures  suicidal thoughts  an unusual or allergic reaction to alprazolam, other benzodiazepines, foods, dyes, or preservatives  pregnant or trying to get pregnant  breast-feeding How should I use this medicine? Take this medicine by mouth with a glass of water. Follow the directions on the prescription label. Take your medicine at regular intervals. Do not take it more often than directed. Do not stop taking except on your doctor's advice. A special MedGuide will be given to you by the pharmacist with each prescription and refill. Be sure to read this information carefully each time. Talk to your pediatrician regarding the use of this medicine in children. Special care may be needed. Overdosage: If you think you have taken too much of this medicine contact a poison control center or emergency room at once. NOTE: This medicine is only for you. Do not share this medicine with others. What if I miss a dose? If you miss a dose, take it as soon as you can. If it is almost time for your next dose, take only that dose. Do not take double or extra doses. What may interact with this medicine? Do not take this medicine with any of the following medications:  certain antiviral medicines for HIV or AIDS like delavirdine,  indinavir  certain medicines for fungal infections like ketoconazole and itraconazole  narcotic medicines for cough  sodium oxybate This medicine may also interact with the following medications:  alcohol  antihistamines for allergy, cough and cold  certain antibiotics like clarithromycin, erythromycin, isoniazid, rifampin, rifapentine, rifabutin, and troleandomycin  certain medicines for blood pressure, heart disease, irregular heart beat  certain medicines for depression, like amitriptyline, fluoxetine, sertraline  certain medicines for seizures like carbamazepine, oxcarbazepine, phenobarbital, phenytoin, primidone  cimetidine  cyclosporine  female hormones, like estrogens or progestins and birth control pills, patches, rings, or injections  general anesthetics like halothane, isoflurane, methoxyflurane, propofol  grapefruit juice  local anesthetics like lidocaine, pramoxine, tetracaine  medicines that relax muscles for surgery  narcotic medicines for pain  other antiviral medicines for HIV or AIDS  phenothiazines like chlorpromazine, mesoridazine, prochlorperazine, thioridazine This list may not describe all possible interactions. Give your health care provider a list of all the medicines, herbs, non-prescription drugs, or dietary supplements you use. Also tell them if you smoke, drink alcohol, or use illegal drugs. Some items may interact with your medicine. What should I watch for while using this medicine? Tell your doctor or health care professional if your symptoms do not start to get better or if they get worse. Do not stop taking except on your doctor's advice. You may develop a severe reaction. Your doctor will tell you how much medicine to take. You may get drowsy or dizzy. Do not drive, use machinery, or do anything that needs mental alertness until you know how this medicine affects   you. To reduce the risk of dizzy and fainting spells, do not stand or sit up  quickly, especially if you are an older patient. Alcohol may increase dizziness and drowsiness. Avoid alcoholic drinks. If you are taking another medicine that also causes drowsiness, you may have more side effects. Give your health care provider a list of all medicines you use. Your doctor will tell you how much medicine to take. Do not take more medicine than directed. Call emergency for help if you have problems breathing or unusual sleepiness. What side effects may I notice from receiving this medicine? Side effects that you should report to your doctor or health care professional as soon as possible:  allergic reactions like skin rash, itching or hives, swelling of the face, lips, or tongue  breathing problems  confusion  loss of balance or coordination  signs and symptoms of low blood pressure like dizziness; feeling faint or lightheaded, falls; unusually weak or tired  suicidal thoughts or other mood changes Side effects that usually do not require medical attention (report to your doctor or health care professional if they continue or are bothersome):  dizziness  dry mouth  nausea, vomiting  tiredness This list may not describe all possible side effects. Call your doctor for medical advice about side effects. You may report side effects to FDA at 1-800-FDA-1088. Where should I keep my medicine? Keep out of the reach of children. This medicine can be abused. Keep your medicine in a safe place to protect it from theft. Do not share this medicine with anyone. Selling or giving away this medicine is dangerous and against the law. Store at room temperature between 20 and 25 degrees C (68 and 77 degrees F). This medicine may cause accidental overdose and death if taken by other adults, children, or pets. Mix any unused medicine with a substance like cat litter or coffee grounds. Then throw the medicine away in a sealed container like a sealed bag or a coffee can with a lid. Do not use  the medicine after the expiration date. NOTE: This sheet is a summary. It may not cover all possible information. If you have questions about this medicine, talk to your doctor, pharmacist, or health care provider.  2020 Elsevier/Gold Standard (2015-09-14 13:47:25)  

## 2020-03-21 ENCOUNTER — Ambulatory Visit: Payer: Medicare Other | Admitting: Physician Assistant

## 2020-03-24 ENCOUNTER — Ambulatory Visit (INDEPENDENT_AMBULATORY_CARE_PROVIDER_SITE_OTHER): Payer: Medicare Other | Admitting: Family Medicine

## 2020-03-24 ENCOUNTER — Other Ambulatory Visit: Payer: Self-pay

## 2020-03-24 ENCOUNTER — Encounter: Payer: Self-pay | Admitting: Family Medicine

## 2020-03-24 VITALS — BP 117/63 | HR 83 | Temp 97.5°F | Resp 20 | Ht 64.0 in | Wt 209.0 lb

## 2020-03-24 DIAGNOSIS — F5101 Primary insomnia: Secondary | ICD-10-CM | POA: Diagnosis not present

## 2020-03-24 DIAGNOSIS — F418 Other specified anxiety disorders: Secondary | ICD-10-CM

## 2020-03-24 DIAGNOSIS — Z79899 Other long term (current) drug therapy: Secondary | ICD-10-CM | POA: Diagnosis not present

## 2020-03-24 MED ORDER — ALPRAZOLAM 0.25 MG PO TABS: 0.2500 mg | ORAL_TABLET | Freq: Every evening | ORAL | 0 refills | Status: DC | PRN

## 2020-03-24 NOTE — Progress Notes (Signed)
Subjective:  Patient ID: Leslie Hensley, female    DOB: March 21, 1946, 74 y.o.   MRN: [id number]  Patient Care Team: Sonny Masters, FNP as PCP - General (Family Medicine)   Chief Complaint:  Medication Refill (xanax )   HPI: Leslie Hensley is a 74 y.o. female presenting on 03/24/2020 for Medication Refill (xanax )  Pt following up today for additional refill of Xanax for situational anxiety and insomnia. Pts PCP has resigned and she has been unable to establish with new PCP. Pt states she still has tearful and anxiety spells, states this is worse at night when her thoughts are racing. States if she takes the Xanax her mind stops racing and she is able to sleep. States she does not take the medication during the day. States she only takes at night. No SI or HI.     Relevant past medical, surgical, family, and social history reviewed and updated as indicated.  Allergies and medications reviewed and updated. Date reviewed: Chart in Epic.   Past Medical History:  Diagnosis Date  . Brain aneurysm   . Hyperlipidemia   . Hypertension   . Thyroid disease     Past Surgical History:  Procedure Laterality Date  . ABDOMINAL HYSTERECTOMY    . BRAIN SURGERY     aneurysm  . FOOT SURGERY Left    spur removed  . KIDNEY STONE SURGERY      Social History   Socioeconomic History  . Marital status: Divorced    Spouse name: Not on file  . Number of children: 3  . Years of education: 10  . Highest education level: 10th grade  Occupational History  . Not on file  Tobacco Use  . Smoking status: Former Smoker    Types: Cigarettes    Quit date: 12/21/1999    Years since quitting: 20.2  . Smokeless tobacco: Never Used  Substance and Sexual Activity  . Alcohol use: Not Currently  . Drug use: Not Currently  . Sexual activity: Not Currently    Birth control/protection: Surgical  Other Topics Concern  . Not on file  Social History Narrative  . Not on file   Social Determinants of  Health   Financial Resource Strain: Low Risk   . Difficulty of Paying Living Expenses: Not hard at all  Food Insecurity: No Food Insecurity  . Worried About Programme researcher, broadcasting/film/video in the Last Year: Never true  . Ran Out of Food in the Last Year: Never true  Transportation Needs: No Transportation Needs  . Lack of Transportation (Medical): No  . Lack of Transportation (Non-Medical): No  Physical Activity: Insufficiently Active  . Days of Exercise per Week: 3 days  . Minutes of Exercise per Session: 40 min  Stress: No Stress Concern Present  . Feeling of Stress : Not at all  Social Connections: Slightly Isolated  . Frequency of Communication with Friends and Family: More than three times a week  . Frequency of Social Gatherings with Friends and Family: More than three times a week  . Attends Religious Services: More than 4 times per year  . Active Member of Clubs or Organizations: Yes  . Attends Banker Meetings: More than 4 times per year  . Marital Status: Divorced  Catering manager Violence: Not At Risk  . Fear of Current or Ex-Partner: No  . Emotionally Abused: No  . Physically Abused: No  . Sexually Abused: No    Outpatient Encounter  Medications as of 03/24/2020  Medication Sig  . ALPRAZolam (XANAX) 0.25 MG tablet Take 1 tablet (0.25 mg total) by mouth at bedtime as needed for anxiety.  Marland Kitchen ascorbic acid (VITAMIN C) 100 MG tablet Take by mouth.  Marland Kitchen aspirin EC 81 MG tablet Take by mouth.  . Cholecalciferol (VITAMIN D3) 25 MCG (1000 UT) CAPS Take by mouth.  . hydrochlorothiazide (HYDRODIURIL) 25 MG tablet Take 1-2 tablets (25-50 mg total) by mouth daily.  Marland Kitchen levothyroxine (SYNTHROID) 75 MCG tablet Take 1 tablet (75 mcg total) by mouth daily.  . Omega-3 1000 MG CAPS Take by mouth.  Marland Kitchen rOPINIRole (REQUIP) 0.25 MG tablet Take 1 to 3 tablets by mouth at bedtime.  . simvastatin (ZOCOR) 20 MG tablet Take 1 tablet (20 mg total) by mouth daily.  . Vitamin E 400 units TABS Take by  mouth.  . [DISCONTINUED] ALPRAZolam (XANAX) 0.25 MG tablet Take 0.25 mg by mouth at bedtime as needed for anxiety.   No facility-administered encounter medications on file as of 03/24/2020.    Allergies  Allergen Reactions  . Codeine Hives  . Cyclobenzaprine Swelling  . Diclofenac Sodium Rash    Review of Systems  Constitutional: Negative for activity change, appetite change, chills, diaphoresis, fatigue, fever and unexpected weight change.  HENT: Negative.   Eyes: Negative.  Negative for photophobia and visual disturbance.  Respiratory: Negative for cough, chest tightness and shortness of breath.   Cardiovascular: Negative for chest pain, palpitations and leg swelling.  Gastrointestinal: Negative for abdominal pain, blood in stool, constipation, diarrhea, nausea and vomiting.  Endocrine: Negative.   Genitourinary: Negative for decreased urine volume, difficulty urinating, dysuria, frequency and urgency.  Musculoskeletal: Negative for arthralgias and myalgias.  Skin: Negative.   Allergic/Immunologic: Negative.   Neurological: Negative for dizziness, tremors, seizures, syncope, facial asymmetry, speech difficulty, weakness, light-headedness, numbness and headaches.  Hematological: Negative.   Psychiatric/Behavioral: Positive for decreased concentration and sleep disturbance. Negative for agitation, behavioral problems, confusion, dysphoric mood, hallucinations, self-injury and suicidal ideas. The patient is nervous/anxious. The patient is not hyperactive.   All other systems reviewed and are negative.       Objective:  BP 117/63   Pulse 83   Temp (!) 97.5 F (36.4 C)   Resp 20   Ht 5\' 4"  (1.626 m)   Wt 209 lb (94.8 kg)   SpO2 95%   BMI 35.87 kg/m    Wt Readings from Last 3 Encounters:  03/24/20 209 lb (94.8 kg)  03/08/20 210 lb (95.3 kg)  12/02/19 216 lb (98 kg)    Physical Exam Vitals and nursing note reviewed.  Constitutional:      General: She is not in acute  distress.    Appearance: Normal appearance. She is well-developed and well-groomed. She is not ill-appearing, toxic-appearing or diaphoretic.  HENT:     Head: Normocephalic and atraumatic.     Jaw: There is normal jaw occlusion.     Right Ear: Hearing normal.     Left Ear: Hearing normal.     Nose: Nose normal.     Mouth/Throat:     Lips: Pink.     Mouth: Mucous membranes are moist.     Pharynx: Oropharynx is clear. Uvula midline.  Eyes:     General: Lids are normal.     Extraocular Movements: Extraocular movements intact.     Conjunctiva/sclera: Conjunctivae normal.     Pupils: Pupils are equal, round, and reactive to light.  Neck:     Thyroid:  No thyroid mass, thyromegaly or thyroid tenderness.     Vascular: No carotid bruit or JVD.     Trachea: Trachea and phonation normal.  Cardiovascular:     Rate and Rhythm: Normal rate and regular rhythm.     Chest Wall: PMI is not displaced.     Pulses: Normal pulses.     Heart sounds: Normal heart sounds. No murmur. No friction rub. No gallop.   Pulmonary:     Effort: Pulmonary effort is normal. No respiratory distress.     Breath sounds: Normal breath sounds. No wheezing.  Abdominal:     General: Bowel sounds are normal. There is no distension or abdominal bruit.     Palpations: Abdomen is soft. There is no hepatomegaly or splenomegaly.     Tenderness: There is no abdominal tenderness. There is no right CVA tenderness or left CVA tenderness.     Hernia: No hernia is present.  Musculoskeletal:        General: Normal range of motion.     Cervical back: Normal range of motion and neck supple.     Right lower leg: No edema.     Left lower leg: No edema.  Lymphadenopathy:     Cervical: No cervical adenopathy.  Skin:    General: Skin is warm and dry.     Capillary Refill: Capillary refill takes less than 2 seconds.     Coloration: Skin is not cyanotic, jaundiced or pale.     Findings: No rash.       Neurological:     General: No  focal deficit present.     Mental Status: She is alert and oriented to person, place, and time.     Cranial Nerves: Cranial nerves are intact.     Sensory: Sensation is intact.     Motor: Motor function is intact.     Coordination: Coordination is intact.     Gait: Gait is intact.     Deep Tendon Reflexes: Reflexes are normal and symmetric.  Psychiatric:        Attention and Perception: Attention and perception normal.        Mood and Affect: Affect normal. Mood is anxious.        Speech: Speech normal.        Behavior: Behavior normal. Behavior is cooperative.        Thought Content: Thought content normal.        Cognition and Memory: Cognition and memory normal.        Judgment: Judgment normal.     Results for orders placed or performed in visit on 12/02/19  Novel Coronavirus, NAA ([facility])   Specimen: Nasopharyngeal(NP) swabs in vial transport medium   NASOPHARYNGE  TESTING  Result Value Ref Range   SARS-CoV-2, NAA Detected (A) Not Detected       Pertinent labs & imaging results that were available during my care of the patient were reviewed by me and considered in my medical decision making.  Assessment & Plan:  Leslie Hensley was seen today for medication refill.  Diagnoses and all orders for this visit:  Situational anxiety Primary insomnia Controlled substance agreement signed PCP has resigned. Has not established with new PCP. Will provide below until pt can be established with new PCP.  -     ToxASSURE Select 13 (MW), Urine -     ALPRAZolam (XANAX) 0.25 MG tablet; Take 1 tablet (0.25 mg total) by mouth at bedtime as needed for anxiety.  Continue all other maintenance medications.  Follow up plan: Return if symptoms worsen or fail to improve.   Continue healthy lifestyle choices, including diet (rich in fruits, vegetables, and lean proteins, and low in salt and simple carbohydrates) and exercise (at least 30 minutes of moderate physical activity daily).   The  above assessment and management plan was discussed with the patient. The patient verbalized understanding of and has agreed to the management plan. Patient is aware to call the clinic if they develop any new symptoms or if symptoms persist or worsen. Patient is aware when to return to the clinic for a follow-up visit. Patient educated on when it is appropriate to go to the emergency department.   Monia Pouch, FNP-C [facility] [phone]

## 2020-03-29 LAB — TOXASSURE SELECT 13 (MW), URINE

## 2020-04-04 ENCOUNTER — Other Ambulatory Visit: Payer: Self-pay | Admitting: Family Medicine

## 2020-04-04 DIAGNOSIS — G4762 Sleep related leg cramps: Secondary | ICD-10-CM

## 2020-04-04 MED ORDER — ROPINIROLE HCL 0.25 MG PO TABS: ORAL_TABLET | ORAL | 0 refills | Status: DC

## 2020-04-04 NOTE — Telephone Encounter (Signed)
  Prescription Request  04/04/2020  What is the name of the medication or equipment? Ropinirole  Have you contacted your pharmacy to request a refill? (if applicable) Yes  Which pharmacy would you like this sent to? [facility] called from [facility] requesting refill on this Rx for pt. Says she has tried sending request to [place] via fax but can't get fax to go through. [phone] (fax# to send response to)   Patient notified that their request is being sent to the clinical staff for review and that they should receive a response within 2 business days.

## 2020-04-04 NOTE — Telephone Encounter (Signed)
Ok to refill requip but say on message [employer] and there are lots of [employer] and not one on her chart. Where would like it sent.

## 2020-04-04 NOTE — Telephone Encounter (Signed)
Covering PCP- please advise  

## 2020-04-04 NOTE — Telephone Encounter (Signed)
Medication pended with the correct pharmacy- please advise If any refills and send in rx.  Patient aware rx will be sent in.

## 2020-04-06 DIAGNOSIS — S065X9D Traumatic subdural hemorrhage with loss of consciousness of unspecified duration, subsequent encounter: Secondary | ICD-10-CM | POA: Diagnosis not present

## 2020-04-06 DIAGNOSIS — S065X9A Traumatic subdural hemorrhage with loss of consciousness of unspecified duration, initial encounter: Secondary | ICD-10-CM | POA: Diagnosis not present

## 2020-04-06 DIAGNOSIS — Z8679 Personal history of other diseases of the circulatory system: Secondary | ICD-10-CM | POA: Diagnosis not present

## 2020-04-06 DIAGNOSIS — R9089 Other abnormal findings on diagnostic imaging of central nervous system: Secondary | ICD-10-CM | POA: Diagnosis not present

## 2020-04-06 DIAGNOSIS — Z9889 Other specified postprocedural states: Secondary | ICD-10-CM | POA: Diagnosis not present

## 2020-04-19 ENCOUNTER — Other Ambulatory Visit: Payer: Self-pay

## 2020-04-19 ENCOUNTER — Encounter: Payer: Self-pay | Admitting: Family Medicine

## 2020-04-19 ENCOUNTER — Ambulatory Visit (INDEPENDENT_AMBULATORY_CARE_PROVIDER_SITE_OTHER): Payer: Medicare Other | Admitting: Family Medicine

## 2020-04-19 VITALS — BP 122/66 | HR 72 | Temp 98.5°F | Ht 64.0 in | Wt 211.0 lb

## 2020-04-19 DIAGNOSIS — R600 Localized edema: Secondary | ICD-10-CM

## 2020-04-19 DIAGNOSIS — F418 Other specified anxiety disorders: Secondary | ICD-10-CM

## 2020-04-19 DIAGNOSIS — Z634 Disappearance and death of family member: Secondary | ICD-10-CM

## 2020-04-19 DIAGNOSIS — Z79899 Other long term (current) drug therapy: Secondary | ICD-10-CM

## 2020-04-19 DIAGNOSIS — E034 Atrophy of thyroid (acquired): Secondary | ICD-10-CM

## 2020-04-19 DIAGNOSIS — F4321 Adjustment disorder with depressed mood: Secondary | ICD-10-CM

## 2020-04-19 DIAGNOSIS — F5101 Primary insomnia: Secondary | ICD-10-CM | POA: Diagnosis not present

## 2020-04-19 MED ORDER — HYDROCHLOROTHIAZIDE 25 MG PO TABS: 25.0000 mg | ORAL_TABLET | Freq: Every day | ORAL | 11 refills | Status: DC

## 2020-04-19 MED ORDER — MIRTAZAPINE 15 MG PO TABS: 15.0000 mg | ORAL_TABLET | Freq: Every day | ORAL | 1 refills | Status: DC

## 2020-04-19 MED ORDER — ALPRAZOLAM 0.25 MG PO TABS: 0.1250 mg | ORAL_TABLET | Freq: Every evening | ORAL | 1 refills | Status: DC | PRN
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
## 2020-04-19 NOTE — Patient Instructions (Signed)
You had labs performed today.  You will be contacted with the results of the labs once they are available, usually in the next 3 business days for routine lab work.  If you have an active my chart account, they will be released to your MyChart.  If you prefer to have these labs released to you via telephone, please let us know.     

## 2020-04-20 LAB — CMP14+EGFR
ALT: 13 IU/L (ref 0–32)
AST: 17 IU/L (ref 0–40)
Albumin/Globulin Ratio: 1.3 (ref 1.2–2.2)
Albumin: 4 g/dL (ref 3.7–4.7)
Alkaline Phosphatase: 89 IU/L (ref 39–117)
BUN/Creatinine Ratio: 20 (ref 12–28)
BUN: 12 mg/dL (ref 8–27)
Bilirubin Total: 0.4 mg/dL (ref 0.0–1.2)
CO2: 26 mmol/L (ref 20–29)
Calcium: 9.8 mg/dL (ref 8.7–10.3)
Chloride: 103 mmol/L (ref 96–106)
Creatinine, Ser: 0.61 mg/dL (ref 0.57–1.00)
GFR calc Af Amer: 105 mL/min/{1.73_m2} (ref >59)
GFR calc non Af Amer: 91 mL/min/{1.73_m2} (ref >59)
Globulin, Total: 3.1 g/dL (ref 1.5–4.5)
Glucose: 88 mg/dL (ref 65–99)
Potassium: 4.6 mmol/L (ref 3.5–5.2)
Sodium: 142 mmol/L (ref 134–144)
Total Protein: 7.1 g/dL (ref 6.0–8.5)

## 2020-04-20 LAB — LIPID PANEL
Chol/HDL Ratio: 2.9 ratio (ref 0.0–4.4)
Cholesterol, Total: 141 mg/dL (ref 100–199)
HDL: 48 mg/dL (ref >39)
LDL Chol Calc (NIH): 71 mg/dL (ref 0–99)
Triglycerides: 123 mg/dL (ref 0–149)
VLDL Cholesterol Cal: 22 mg/dL (ref 5–40)

## 2020-04-20 LAB — THYROID PANEL WITH TSH
Free Thyroxine Index: 2.4 (ref 1.2–4.9)
T3 Uptake Ratio: 24 % (ref 24–39)
T4, Total: 9.9 ug/dL (ref 4.5–12.0)
TSH: 0.621 u[IU]/mL (ref 0.450–4.500)

## 2020-05-01 ENCOUNTER — Other Ambulatory Visit: Payer: Self-pay | Admitting: Nurse Practitioner

## 2020-05-01 DIAGNOSIS — G4762 Sleep related leg cramps: Secondary | ICD-10-CM

## 2020-05-19 ENCOUNTER — Ambulatory Visit (INDEPENDENT_AMBULATORY_CARE_PROVIDER_SITE_OTHER): Payer: Medicare Other | Admitting: Family Medicine

## 2020-05-19 DIAGNOSIS — G4762 Sleep related leg cramps: Secondary | ICD-10-CM | POA: Diagnosis not present

## 2020-05-19 DIAGNOSIS — F418 Other specified anxiety disorders: Secondary | ICD-10-CM | POA: Diagnosis not present

## 2020-05-19 DIAGNOSIS — F5101 Primary insomnia: Secondary | ICD-10-CM

## 2020-05-19 MED ORDER — MIRTAZAPINE 15 MG PO TABS: 15.0000 mg | ORAL_TABLET | Freq: Every day | ORAL | 1 refills | Status: DC

## 2020-05-19 MED ORDER — ROPINIROLE HCL 0.25 MG PO TABS: 0.2500 mg | ORAL_TABLET | Freq: Every day | ORAL | 5 refills | Status: DC

## 2020-05-19 NOTE — Progress Notes (Signed)
Telephone visit  Subjective: CC: f/u insomnia, GAD PCP: Raliegh Ip, DO LKG:MWNUU Leslie Hensley is a 74 y.o. female calls for telephone consult today. Patient provides verbal consent for consult held via phone.  Due to COVID-19 pandemic this visit was conducted virtually. This visit type was conducted due to national recommendations for restrictions regarding the COVID-19 Pandemic (Leslie.g. social distancing, sheltering in place) in an effort to limit this patient's exposure and mitigate transmission in our community. All issues noted in this document were discussed and addressed.  A physical exam was not performed with this format.   Location of patient: home Location of provider: [facility] Others present for call: none  1. Insomnia/ GAD Patient notes that the first night sleep was poor.  She had taken the Remeron right before she went to bed and was unable to successfully fall asleep.  She started taking the Remeron at 7 PM and has had excellent sleep since that time.  She is sleeping through the night.  She feels well rested in the morning.  Denies any anxiety or panic attack symptoms during the daytime.  She is totally off of the alprazolam.  No concerning symptoms or signs from the Remeron.  She is very pleased and does not desire any dose adjustments today.   ROS: Per HPI  Allergies  Allergen Reactions  . Codeine Hives  . Cyclobenzaprine Swelling  . Diclofenac Sodium Rash   Past Medical History:  Diagnosis Date  . Brain aneurysm   . Hyperlipidemia   . Hypertension   . Thyroid disease     Current Outpatient Medications:  .  ascorbic acid (VITAMIN C) 100 MG tablet, Take by mouth., Disp: , Rfl:  .  Cholecalciferol (VITAMIN D3) 25 MCG (1000 UT) CAPS, Take by mouth., Disp: , Rfl:  .  hydrochlorothiazide (HYDRODIURIL) 25 MG tablet, Take 1 tablet (25 mg total) by mouth daily., Disp: 30 tablet, Rfl: 11 .  levothyroxine (SYNTHROID) 75 MCG tablet, Take 1 tablet (75 mcg total) by mouth  daily., Disp: 90 tablet, Rfl: 3 .  mirtazapine (REMERON) 15 MG tablet, Take 1 tablet (15 mg total) by mouth at bedtime., Disp: 90 tablet, Rfl: 1 .  Omega-3 1000 MG CAPS, Take by mouth., Disp: , Rfl:  .  rOPINIRole (REQUIP) 0.25 MG tablet, Take 1 to 3 tablets by mouth at bedtime., Disp: 60 tablet, Rfl: 0 .  simvastatin (ZOCOR) 20 MG tablet, Take 1 tablet (20 mg total) by mouth daily., Disp: 90 tablet, Rfl: 3 .  Vitamin Leslie 400 units TABS, Take by mouth., Disp: , Rfl:   Gen: does not sound distressed Psych: speech normal. Pleasant.  Assessment/ Plan: 74 y.o. female   1. Primary insomnia Doing well.  Remeron refilled.  Follow up in 4-6 months, sooner if needed. - mirtazapine (REMERON) 15 MG tablet; Take 1 tablet (15 mg total) by mouth at bedtime.  Dispense: 90 tablet; Refill: 1  2. Situational anxiety Off xanax.  Symptoms are well controlled with Remeron. - mirtazapine (REMERON) 15 MG tablet; Take 1 tablet (15 mg total) by mouth at bedtime.  Dispense: 90 tablet; Refill: 1   Start time: 10:38am End time: 10:42am  Total time spent on patient care (including telephone call/ virtual visit): 10 minutes  Callahan Peddie Hulen Skains, DO [facility] [phone]

## 2020-06-22 DIAGNOSIS — Z9889 Other specified postprocedural states: Secondary | ICD-10-CM | POA: Diagnosis not present

## 2020-06-22 DIAGNOSIS — S065X9D Traumatic subdural hemorrhage with loss of consciousness of unspecified duration, subsequent encounter: Secondary | ICD-10-CM | POA: Diagnosis not present

## 2020-06-22 DIAGNOSIS — G9389 Other specified disorders of brain: Secondary | ICD-10-CM | POA: Diagnosis not present

## 2020-06-22 DIAGNOSIS — Z87820 Personal history of traumatic brain injury: Secondary | ICD-10-CM | POA: Diagnosis not present

## 2020-06-22 DIAGNOSIS — G9589 Other specified diseases of spinal cord: Secondary | ICD-10-CM | POA: Diagnosis not present

## 2020-07-05 DIAGNOSIS — M79672 Pain in left foot: Secondary | ICD-10-CM | POA: Diagnosis not present

## 2020-07-05 DIAGNOSIS — M7662 Achilles tendinitis, left leg: Secondary | ICD-10-CM | POA: Diagnosis not present

## 2020-07-10 ENCOUNTER — Other Ambulatory Visit: Payer: Self-pay

## 2020-07-10 ENCOUNTER — Ambulatory Visit: Payer: Medicare Other | Attending: Podiatry | Admitting: Physical Therapy

## 2020-07-10 DIAGNOSIS — M25672 Stiffness of left ankle, not elsewhere classified: Secondary | ICD-10-CM

## 2020-07-10 DIAGNOSIS — M25572 Pain in left ankle and joints of left foot: Secondary | ICD-10-CM | POA: Diagnosis not present

## 2020-07-10 NOTE — Therapy (Signed)
[facility] [address] [place], [place], [address] Phone: [phone]   Fax:  [phone]  Physical Therapy Evaluation  Patient Details  Name: Leslie Hensley MRN: [id number] Date of Birth: Apr 07, 1946 Referring Provider (PT): Jacinto Reap DPM.   Encounter Date: 07/10/2020   PT End of Session - 07/10/20 1234    Visit Number 1    Number of Visits 12    Date for PT Re-Evaluation 10/08/20    Authorization Type FOTO.    PT Start Time 1118    PT Stop Time 1204    PT Time Calculation (min) 46 min    Activity Tolerance Patient tolerated treatment well    Behavior During Therapy WFL for tasks assessed/performed           Past Medical History:  Diagnosis Date  . Brain aneurysm   . Hyperlipidemia   . Hypertension   . Thyroid disease     Past Surgical History:  Procedure Laterality Date  . ABDOMINAL HYSTERECTOMY    . BRAIN SURGERY     aneurysm  . FOOT SURGERY Left    spur removed  . KIDNEY STONE SURGERY      There were no vitals filed for this visit.    Subjective Assessment - 07/10/20 1239    Subjective COVID-19 screen performed prior to patient entering clinic.  The patient presents to the clinic today with c/o left heel pain that has been ongoing for two months+.  Her pain-level at rest today is a 4/10 but can rise to much higher levels when pushing off during her gait cycle and walking up inclines.  Rest decreases her pain.    Pertinent History Brian aneurysm, left foot bone spur removal, HTN, Thyroid problem.    How long can you walk comfortably? Varies depending on terrain.    Patient Stated Goals Walk without pain.    Currently in Pain? Yes    Pain Score 4     Pain Location Ankle    Pain Orientation Left    Pain Descriptors / Indicators Throbbing;Sharp    Pain Type Acute pain    Pain Onset More than a month ago    Pain Frequency Intermittent    Aggravating Factors  See above.    Pain Relieving Factors See above.                [facility] PT Assessment - 07/10/20 0001      Assessment   Medical Diagnosis Left Achilles tendonitis.    Referring Provider (PT) Jacinto Reap DPM.    Onset Date/Surgical Date --   ~2 months.     Precautions   Precautions None      Restrictions   Weight Bearing Restrictions No      Balance Screen   Has the patient fallen in the past 6 months No    Has the patient had a decrease in activity level because of a fear of falling?  No    Is the patient reluctant to leave their home because of a fear of falling?  No      Home Environment   Living Environment Private residence      Prior Function   Level of Independence Independent      Observation/Other Assessments   Focus on Therapeutic Outcomes (FOTO)  43% limitation.      Observation/Other Assessments-Edema    Edema --   Localized swelling at left Achilles attachment on calcaneus.     ROM / Strength  AROM / PROM / Strength AROM;Strength      AROM   Overall AROM Comments Active left ankle dorsiflexion to 5 degrees with left knee in full extension and 10 degrees with left knee flexed.      Strength   Overall Strength Comments Normal left ankle strength.      Palpation   Palpation comment Tender to palpation over left Achilles tendon attachment on calcaneus.      Ambulation/Gait   Gait Comments Generally normal gait cycle observed on level terrain today.                      Objective measurements completed on examination: See above findings.       [facility] Adult PT Treatment/Exercise - 07/10/20 0001      Modalities   Modalities Electrical Stimulation;Vasopneumatic      Electrical Stimulation   Electrical Stimulation Location Left posterior heel.    Electrical Stimulation Action Pre-mod.    Electrical Stimulation Parameters 80-150 Hz x 20 minutes.    Electrical Stimulation Goals Edema;Pain      Vasopneumatic   Number Minutes Vasopneumatic  20 minutes    Vasopnuematic Location  --    Right ankle.   Vasopneumatic Pressure Low                       PT Long Term Goals - 07/10/20 1253      PT LONG TERM GOAL #1   Title Independent with a HEP.    Time 6    Period Weeks    Status New      PT LONG TERM GOAL #2   Title Increase left ankle dorsiflexion to 8-10 degrees to normalize the patient's gait pattern.    Time 6    Period Weeks    Status New      PT LONG TERM GOAL #3   Title Perform ADL's with pain not > 3/10.    Time 6    Period Weeks    Status New      PT LONG TERM GOAL #4   Title Walk a community distance with pain not > 3/10.    Time 6    Period Weeks    Status New                  Plan - 07/10/20 1248    Clinical Impression Statement The patient presents to OPPT with c/o left Achilles pain that increases when walking up inclines and during the toe-off phase of gait.  She has some loss of active left ankle dorsiflexion.  She is tender to palpation over her left Achille attachment at the calcaneus with localized swelling.  Patient will benefit from skilled physical therapy intervention to address deficits and pain.    Personal Factors and Comorbidities Comorbidity 1;Comorbidity 2    Comorbidities Brian aneurysm, left foot bone spur removal, HTN, Thyroid problem.    Examination-Activity Limitations Locomotion Level;Other    Examination-Participation Restrictions Other    Stability/Clinical Decision Making Evolving/Moderate complexity    Clinical Decision Making Low    Rehab Potential Good    PT Frequency 2x / week    PT Duration 6 weeks    PT Treatment/Interventions ADLs/Self Care Home Management;Cryotherapy;Electrical Stimulation;Ultrasound;Moist Heat;Iontophoresis 4mg /ml Dexamethasone;Therapeutic activities;Therapeutic exercise;Manual techniques;Patient/family education;Dry needling;Joint Manipulations;Vasopneumatic Device    PT Next Visit Plan Iontophoresis to patient's left heel once cert is signed, pulsed combo e'stim/U/S,  Gastroc-Soleus stretching, STW/M/IASTM, vasopneumatic and electrical stimulation.  Consulted and Agree with Plan of Care Patient           Patient will benefit from skilled therapeutic intervention in order to improve the following deficits and impairments:  Pain, Increased edema, Difficulty walking, Decreased activity tolerance, Decreased range of motion  Visit Diagnosis: Pain in left ankle and joints of left foot  Stiffness of left ankle, not elsewhere classified     Problem List Patient Active Problem List   Diagnosis Date Noted  . Primary insomnia 03/08/2020  . Controlled substance agreement signed 03/08/2020  . Situational anxiety 03/08/2020  . Varicose vein of leg 06/11/2019  . Osteopenia 06/11/2019  . Bleeding in brain due to brain aneurysm (HCC) 04/30/2019  . De Quervain's tenosynovitis, left 04/08/2019  . Seasonal allergic rhinitis 09/15/2018  . Essential hypertension 10/21/2013  . GERD (gastroesophageal reflux disease) 10/21/2013  . H/O discoid lupus erythematosus 10/21/2013  . Hyperlipidemia 10/21/2013  . Hypothyroidism 10/21/2013    Izaac Reisig, [place] MPT 07/10/2020, 12:56 PM  [facility] [address] [place], [place], [address] Phone: [phone]   Fax:  [phone]  Name: Leslie Hensley MRN: [id number] Date of Birth: 1946-06-19

## 2020-07-12 ENCOUNTER — Ambulatory Visit: Payer: Medicare Other | Admitting: Physical Therapy

## 2020-07-12 ENCOUNTER — Other Ambulatory Visit: Payer: Self-pay

## 2020-07-12 DIAGNOSIS — M25672 Stiffness of left ankle, not elsewhere classified: Secondary | ICD-10-CM | POA: Diagnosis not present

## 2020-07-12 DIAGNOSIS — M25572 Pain in left ankle and joints of left foot: Secondary | ICD-10-CM

## 2020-07-12 NOTE — Therapy (Signed)
[facility] [address] [place], [place], [address] Phone: [phone]   Fax:  [phone]  Physical Therapy Treatment  Patient Details  Name: Leslie Hensley MRN: [id number] Date of Birth: 1946-10-29 Referring Provider (PT): Jacinto Reap DPM.   Encounter Date: 07/12/2020   PT End of Session - 07/12/20 1234    Visit Number 2    Number of Visits 12    Date for PT Re-Evaluation 10/08/20    Authorization Type FOTO.    PT Start Time 1115    PT Stop Time 1154    PT Time Calculation (min) 39 min    Activity Tolerance Patient tolerated treatment well    Behavior During Therapy WFL for tasks assessed/performed           Past Medical History:  Diagnosis Date  . Brain aneurysm   . Hyperlipidemia   . Hypertension   . Thyroid disease     Past Surgical History:  Procedure Laterality Date  . ABDOMINAL HYSTERECTOMY    . BRAIN SURGERY     aneurysm  . FOOT SURGERY Left    spur removed  . KIDNEY STONE SURGERY      There were no vitals filed for this visit.   Subjective Assessment - 07/12/20 1231    Subjective COVID-19 screen performed prior to patient entering clinic.  No new complaints.    Pertinent History Brian aneurysm, left foot bone spur removal, HTN, Thyroid problem.    How long can you walk comfortably? Varies depending on terrain.    Patient Stated Goals Walk without pain.    Currently in Pain? Yes    Pain Score 4     Pain Location Ankle    Pain Orientation Left    Pain Descriptors / Indicators Throbbing;Sharp    Pain Onset More than a month ago                             [facility] Adult PT Treatment/Exercise - 07/12/20 0001      Modalities   Modalities Electrical Stimulation;Vasopneumatic      Electrical Stimulation   Electrical Stimulation Location Left post heel.    Electrical Stimulation Action Pre-mod.    Electrical Stimulation Parameters 80-150 Hz x 20 minutes.    Electrical Stimulation  Goals Edema;Pain      Vasopneumatic   Number Minutes Vasopneumatic  20 minutes    Vasopnuematic Location  --   Left ankle.   Vasopneumatic Pressure Low      Manual Therapy   Manual Therapy Soft tissue mobilization    Soft tissue mobilization IASTM x 8 minutes to patient's left affected Achilles tendon area.                       PT Long Term Goals - 07/10/20 1253      PT LONG TERM GOAL #1   Title Independent with a HEP.    Time 6    Period Weeks    Status New      PT LONG TERM GOAL #2   Title Increase left ankle dorsiflexion to 8-10 degrees to normalize the patient's gait pattern.    Time 6    Period Weeks    Status New      PT LONG TERM GOAL #3   Title Perform ADL's with pain not > 3/10.    Time 6    Period Weeks  Status New      PT LONG TERM GOAL #4   Title Walk a community distance with pain not > 3/10.    Time 6    Period Weeks    Status New                 Plan - 07/12/20 1238    Clinical Impression Statement Patient did well with IASTM to her left Achilles today.    Personal Factors and Comorbidities Comorbidity 1;Comorbidity 2    Comorbidities Brian aneurysm, left foot bone spur removal, HTN, Thyroid problem.    Examination-Participation Restrictions Other    Stability/Clinical Decision Making Evolving/Moderate complexity    Rehab Potential Good    PT Frequency 2x / week    PT Duration 6 weeks    PT Treatment/Interventions ADLs/Self Care Home Management;Cryotherapy;Electrical Stimulation;Ultrasound;Moist Heat;Iontophoresis 4mg /ml Dexamethasone;Therapeutic activities;Therapeutic exercise;Manual techniques;Patient/family education;Dry needling;Joint Manipulations;Vasopneumatic Device    PT Next Visit Plan Iontophoresis to patient's left heel once cert is signed, pulsed combo e'stim/U/S, Gastroc-Soleus stretching, STW/M/IASTM, vasopneumatic and electrical stimulation.    Consulted and Agree with Plan of Care Patient           Patient  will benefit from skilled therapeutic intervention in order to improve the following deficits and impairments:  Pain, Increased edema, Difficulty walking, Decreased activity tolerance, Decreased range of motion  Visit Diagnosis: Pain in left ankle and joints of left foot  Stiffness of left ankle, not elsewhere classified     Problem List Patient Active Problem List   Diagnosis Date Noted  . Primary insomnia 03/08/2020  . Controlled substance agreement signed 03/08/2020  . Situational anxiety 03/08/2020  . Varicose vein of leg 06/11/2019  . Osteopenia 06/11/2019  . Bleeding in brain due to brain aneurysm (HCC) 04/30/2019  . De Quervain's tenosynovitis, left 04/08/2019  . Seasonal allergic rhinitis 09/15/2018  . Essential hypertension 10/21/2013  . GERD (gastroesophageal reflux disease) 10/21/2013  . H/O discoid lupus erythematosus 10/21/2013  . Hyperlipidemia 10/21/2013  . Hypothyroidism 10/21/2013    Leslie Hensley, 10/23/2013 MPT 07/12/2020, 12:45 PM  [facility] [address] [place], [place], [place] Phone: [phone]   Fax:  [phone]  Name: Leslie Hensley MRN: [name] Date of Birth: Aug 04, 1946

## 2020-07-14 ENCOUNTER — Telehealth: Payer: Self-pay | Admitting: Family Medicine
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
## 2020-07-14 NOTE — Telephone Encounter (Signed)
Pt states she was stung this morning and she took benadryl and has been keeping ice on the stings but the pain is "unbearable". Advised pt if she is in that much pain she should go to Urgent Care or ED and pt voiced understanding.

## 2020-07-17 ENCOUNTER — Encounter: Payer: Self-pay | Admitting: Physical Therapy

## 2020-07-17 ENCOUNTER — Other Ambulatory Visit: Payer: Self-pay

## 2020-07-17 ENCOUNTER — Ambulatory Visit: Payer: Medicare Other | Admitting: Physical Therapy

## 2020-07-17 DIAGNOSIS — M25672 Stiffness of left ankle, not elsewhere classified: Secondary | ICD-10-CM

## 2020-07-17 DIAGNOSIS — M25572 Pain in left ankle and joints of left foot: Secondary | ICD-10-CM | POA: Diagnosis not present

## 2020-07-17 NOTE — Therapy (Signed)
[facility] [address] [place], [place], [address] Phone: [phone]   Fax:  [phone]  Physical Therapy Treatment  Patient Details  Name: Leslie Hensley MRN: [id number] Date of Birth: Dec 01, 1946 Referring Provider (PT): Jacinto Reap DPM.   Encounter Date: 07/17/2020   PT End of Session - 07/17/20 1124    Visit Number 3    Number of Visits 12    Date for PT Re-Evaluation 10/08/20    Authorization Type FOTO.    PT Start Time 1115    PT Stop Time 1206    PT Time Calculation (min) 51 min    Activity Tolerance Patient tolerated treatment well    Behavior During Therapy WFL for tasks assessed/performed           Past Medical History:  Diagnosis Date  . Brain aneurysm   . Hyperlipidemia   . Hypertension   . Thyroid disease     Past Surgical History:  Procedure Laterality Date  . ABDOMINAL HYSTERECTOMY    . BRAIN SURGERY     aneurysm  . FOOT SURGERY Left    spur removed  . KIDNEY STONE SURGERY      There were no vitals filed for this visit.   Subjective Assessment - 07/17/20 1123    Subjective COVID-19 screen performed prior to patient entering clinic. Reports getting stung by bees on her left foot/ankle over the weekend.    Pertinent History Brian aneurysm, left foot bone spur removal, HTN, Thyroid problem.    How long can you walk comfortably? Varies depending on terrain.    Patient Stated Goals Walk without pain.    Currently in Pain? Yes   did not provide number on pain scale             [facility] PT Assessment - 07/17/20 0001      Assessment   Medical Diagnosis Left Achilles tendonitis.    Referring Provider (PT) Jacinto Reap DPM.      Precautions   Precautions None      Restrictions   Weight Bearing Restrictions No                         [facility] Adult PT Treatment/Exercise - 07/17/20 0001      Exercises   Exercises Ankle      Modalities   Modalities Electrical  Stimulation;Vasopneumatic;Ultrasound      Electrical Stimulation   Electrical Stimulation Location Left posterior heel.    Electrical Stimulation Action pre-mod    Electrical Stimulation Parameters 80-150 hz x10 mins    Electrical Stimulation Goals Edema;Pain      Vasopneumatic   Number Minutes Vasopneumatic  15 minutes    Vasopnuematic Location  Ankle    Vasopneumatic Pressure Low      Manual Therapy   Manual Therapy Passive ROM    Soft tissue mobilization STW/M to achilles tendon for mobilization    Passive ROM PROM into L DF for calf stretching for ROM      Ankle Exercises: Aerobic   Nustep Level 4 x10 mins for DF stretching                       PT Long Term Goals - 07/10/20 1253      PT LONG TERM GOAL #1   Title Independent with a HEP.    Time 6    Period Weeks    Status New  PT LONG TERM GOAL #2   Title Increase left ankle dorsiflexion to 8-10 degrees to normalize the patient's gait pattern.    Time 6    Period Weeks    Status New      PT LONG TERM GOAL #3   Title Perform ADL's with pain not > 3/10.    Time 6    Period Weeks    Status New      PT LONG TERM GOAL #4   Title Walk a community distance with pain not > 3/10.    Time 6    Period Weeks    Status New                 Plan - 07/17/20 1219    Clinical Impression Statement Patient arrives with left ankle and posterior heel pain but overall did well with the addition of the nustep. Notable edema on lateral aspect of the foot observed likely due to the bee stings. Combo US/E-stim initiated per plan of care with normal response. PROM and STW/M performed to improve ROM and achilles tendon mobility. No adverse effects upon removal of modalities.    Personal Factors and Comorbidities Comorbidity 1;Comorbidity 2    Comorbidities Brian aneurysm, left foot bone spur removal, HTN, Thyroid problem.    Examination-Activity Limitations Locomotion Level;Other    Examination-Participation  Restrictions Other    Stability/Clinical Decision Making Evolving/Moderate complexity    Clinical Decision Making Low    Rehab Potential Good    PT Frequency 2x / week    PT Duration 6 weeks    PT Treatment/Interventions ADLs/Self Care Home Management;Cryotherapy;Electrical Stimulation;Ultrasound;Moist Heat;Iontophoresis 4mg /ml Dexamethasone;Therapeutic activities;Therapeutic exercise;Manual techniques;Patient/family education;Dry needling;Joint Manipulations;Vasopneumatic Device    PT Next Visit Plan Iontophoresis to patient's left heel once cert is signed, pulsed combo e'stim/U/S, Gastroc-Soleus stretching, STW/M/IASTM, vasopneumatic and electrical stimulation.    Consulted and Agree with Plan of Care Patient           Patient will benefit from skilled therapeutic intervention in order to improve the following deficits and impairments:  Pain, Increased edema, Difficulty walking, Decreased activity tolerance, Decreased range of motion  Visit Diagnosis: Pain in left ankle and joints of left foot  Stiffness of left ankle, not elsewhere classified     Problem List Patient Active Problem List   Diagnosis Date Noted  . Primary insomnia 03/08/2020  . Controlled substance agreement signed 03/08/2020  . Situational anxiety 03/08/2020  . Varicose vein of leg 06/11/2019  . Osteopenia 06/11/2019  . Bleeding in brain due to brain aneurysm (HCC) 04/30/2019  . De Quervain's tenosynovitis, left 04/08/2019  . Seasonal allergic rhinitis 09/15/2018  . Essential hypertension 10/21/2013  . GERD (gastroesophageal reflux disease) 10/21/2013  . H/O discoid lupus erythematosus 10/21/2013  . Hyperlipidemia 10/21/2013  . Hypothyroidism 10/21/2013    10/23/2013, PT, DPT 07/17/2020, 12:30 PM  [facility] [address] [place], [place], [place] Phone: [phone]   Fax:  [phone]  Name: Leslie Hensley MRN: [name] Date of Birth:  1946-05-06

## 2020-07-19 ENCOUNTER — Other Ambulatory Visit: Payer: Self-pay

## 2020-07-19 ENCOUNTER — Ambulatory Visit: Payer: Medicare Other | Admitting: Physical Therapy

## 2020-07-19 DIAGNOSIS — M25572 Pain in left ankle and joints of left foot: Secondary | ICD-10-CM

## 2020-07-19 DIAGNOSIS — M25672 Stiffness of left ankle, not elsewhere classified: Secondary | ICD-10-CM | POA: Diagnosis not present

## 2020-07-19 NOTE — Therapy (Signed)
[facility] [address] [place], [place], [address] Phone: [phone]   Fax:  [phone]  Physical Therapy Treatment  Patient Details  Name: Leslie Hensley MRN: [id number] Date of Birth: 1946/10/24 Referring Provider (PT): Jacinto Reap DPM.   Encounter Date: 07/19/2020   PT End of Session - 07/19/20 1221    Visit Number 4    Number of Visits 12    Date for PT Re-Evaluation 10/08/20    Authorization Type FOTO.    PT Start Time 1118    PT Stop Time 1157    PT Time Calculation (min) 39 min    Activity Tolerance Patient tolerated treatment well    Behavior During Therapy WFL for tasks assessed/performed           Past Medical History:  Diagnosis Date   Brain aneurysm    Hyperlipidemia    Hypertension    Thyroid disease     Past Surgical History:  Procedure Laterality Date   ABDOMINAL HYSTERECTOMY     BRAIN SURGERY     aneurysm   FOOT SURGERY Left    spur removed   KIDNEY STONE SURGERY      There were no vitals filed for this visit.   Subjective Assessment - 07/19/20 1220    Subjective COVID-19 screen performed prior to patient entering clinic.  Heel feel better.    Pertinent History Brian aneurysm, left foot bone spur removal, HTN, Thyroid problem.    How long can you walk comfortably? Varies depending on terrain.    Patient Stated Goals Walk without pain.    Currently in Pain? Yes    Pain Score 2     Pain Location Ankle    Pain Orientation Left    Pain Descriptors / Indicators Dull;Aching    Pain Type Acute pain    Pain Onset More than a month ago                             [facility] Adult PT Treatment/Exercise - 07/19/20 0001      Modalities   Modalities Electrical Stimulation;Ultrasound;Vasopneumatic      Electrical Stimulation   Electrical Stimulation Location Left posterior heel.    Electrical Stimulation Action Pre-mod.    Electrical Stimulation Parameters 80-150 Hz x 20  minutes.    Electrical Stimulation Goals Edema;Pain      Ultrasound   Ultrasound Location Left Achilles attachment on calcaneus.    Ultrasound Parameters Combo e'stim/US at 1.50 W/CM2 at 20% x 7 minutes.    Ultrasound Goals Pain      Vasopneumatic   Number Minutes Vasopneumatic  20 minutes    Vasopnuematic Location  --   Right ankle.   Vasopneumatic Pressure Low      Manual Therapy   Soft tissue mobilization STW/M x 5 minutes to patient's affected left Achilles.                       PT Long Term Goals - 07/10/20 1253      PT LONG TERM GOAL #1   Title Independent with a HEP.    Time 6    Period Weeks    Status New      PT LONG TERM GOAL #2   Title Increase left ankle dorsiflexion to 8-10 degrees to normalize the patients gait pattern.    Time 6    Period Weeks    Status New  PT LONG TERM GOAL #3   Title Perform ADL's with pain not > 3/10.    Time 6    Period Weeks    Status New      PT LONG TERM GOAL #4   Title Walk a community distance with pain not > 3/10.    Time 6    Period Weeks    Status New                 Plan - 07/19/20 1224    Clinical Impression Statement The patient is responding very well to treatments with a lowered pain-level today.  Significantly less palpable pain over her left Achilles tendon at calcaneal attachment.    Personal Factors and Comorbidities Comorbidity 1;Comorbidity 2    Comorbidities Brian aneurysm, left foot bone spur removal, HTN, Thyroid problem.    Examination-Activity Limitations Locomotion Level;Other    Examination-Participation Restrictions Other    Stability/Clinical Decision Making Evolving/Moderate complexity    Rehab Potential Good    PT Frequency 2x / week    PT Duration 6 weeks    PT Treatment/Interventions ADLs/Self Care Home Management;Cryotherapy;Electrical Stimulation;Ultrasound;Moist Heat;Iontophoresis 4mg /ml Dexamethasone;Therapeutic activities;Therapeutic exercise;Manual  techniques;Patient/family education;Dry needling;Joint Manipulations;Vasopneumatic Device    PT Next Visit Plan Iontophoresis to patient's left heel once cert is signed, pulsed combo e'stim/U/S, Gastroc-Soleus stretching, STW/M/IASTM, vasopneumatic and electrical stimulation.    Consulted and Agree with Plan of Care Patient           Patient will benefit from skilled therapeutic intervention in order to improve the following deficits and impairments:  Pain, Increased edema, Difficulty walking, Decreased activity tolerance, Decreased range of motion  Visit Diagnosis: Pain in left ankle and joints of left foot  Stiffness of left ankle, not elsewhere classified     Problem List Patient Active Problem List   Diagnosis Date Noted   Primary insomnia 03/08/2020   Controlled substance agreement signed 03/08/2020   Situational anxiety 03/08/2020   Varicose vein of leg 06/11/2019   Osteopenia 06/11/2019   Bleeding in brain due to brain aneurysm (HCC) 04/30/2019   De Quervain's tenosynovitis, left 04/08/2019   Seasonal allergic rhinitis 09/15/2018   Essential hypertension 10/21/2013   GERD (gastroesophageal reflux disease) 10/21/2013   H/O discoid lupus erythematosus 10/21/2013   Hyperlipidemia 10/21/2013   Hypothyroidism 10/21/2013    Ari Engelbrecht, 10/23/2013 MPT 07/19/2020, 12:27 PM  [facility] [address] [place], [place], [place] Phone: [phone]   Fax:  [phone]  Name: Leslie Hensley MRN: [name] Date of Birth: May 14, 1946

## 2020-07-24 ENCOUNTER — Encounter: Payer: Self-pay | Admitting: Physical Therapy

## 2020-07-24 ENCOUNTER — Other Ambulatory Visit: Payer: Self-pay

## 2020-07-24 ENCOUNTER — Ambulatory Visit: Payer: Medicare Other | Admitting: Physical Therapy

## 2020-07-24 DIAGNOSIS — M25572 Pain in left ankle and joints of left foot: Secondary | ICD-10-CM

## 2020-07-24 DIAGNOSIS — M25672 Stiffness of left ankle, not elsewhere classified: Secondary | ICD-10-CM | POA: Diagnosis not present

## 2020-07-24 NOTE — Therapy (Signed)
[facility] [address] [place], [place], [address] Phone: [phone]   Fax:  [phone]  Physical Therapy Treatment  Patient Details  Name: Leslie Hensley MRN: [id number] Date of Birth: 09-Jul-1946 Referring Provider (PT): Jacinto Reap DPM.   Encounter Date: 07/24/2020   PT End of Session - 07/24/20 1200    Visit Number 5    Number of Visits 12    Date for PT Re-Evaluation 10/08/20    Authorization Type FOTO.    PT Start Time 1116    PT Stop Time 1200    PT Time Calculation (min) 44 min    Activity Tolerance Patient tolerated treatment well    Behavior During Therapy WFL for tasks assessed/performed           Past Medical History:  Diagnosis Date  . Brain aneurysm   . Hyperlipidemia   . Hypertension   . Thyroid disease     Past Surgical History:  Procedure Laterality Date  . ABDOMINAL HYSTERECTOMY    . BRAIN SURGERY     aneurysm  . FOOT SURGERY Left    spur removed  . KIDNEY STONE SURGERY      There were no vitals filed for this visit.   Subjective Assessment - 07/24/20 1121    Subjective COVID-19 screen performed prior to patient entering clinic. Reports a little bit of pain yesterday but feeling better today. Reports having pain with the boot at night and she takes it off in the middle of the night.    Pertinent History Brian aneurysm, left foot bone spur removal, HTN, Thyroid problem.    How long can you walk comfortably? Varies depending on terrain.    Patient Stated Goals Walk without pain.    Currently in Pain? No/denies              [facility] PT Assessment - 07/24/20 0001      Assessment   Medical Diagnosis Left Achilles tendonitis.    Referring Provider (PT) Jacinto Reap DPM.    Next MD Visit 09/06/2020      Precautions   Precautions None      Restrictions   Weight Bearing Restrictions No                         [facility] Adult PT Treatment/Exercise - 07/24/20 0001      Modalities    Modalities Electrical Stimulation;Ultrasound;Vasopneumatic;Iontophoresis      Electrical Stimulation   Electrical Stimulation Location Left posterior heel.    Electrical Stimulation Action pre-mod    Electrical Stimulation Parameters 80-150 hz x10 mins    Electrical Stimulation Goals Edema;Pain      Ultrasound   Ultrasound Location left achilles    Ultrasound Parameters combo e-stim/US 1.5w/cm2 20%, 3.3 mhz x8 mins      Iontophoresis   Type of Iontophoresis Dexamethasone    Location left achilles tendon    Dose 1.0 ml (1 of 6)    Time 8      Vasopneumatic   Number Minutes Vasopneumatic  10 minutes    Vasopnuematic Location  Ankle    Vasopneumatic Pressure Low    Vasopneumatic Temperature  34      Ankle Exercises: Aerobic   Nustep Level 3 x10 mins for DF stretching      Ankle Exercises: Supine   T-Band yellow theraband 4 way ankle x20 each  PT Long Term Goals - 07/10/20 1253      PT LONG TERM GOAL #1   Title Independent with a HEP.    Time 6    Period Weeks    Status New      PT LONG TERM GOAL #2   Title Increase left ankle dorsiflexion to 8-10 degrees to normalize the patient's gait pattern.    Time 6    Period Weeks    Status New      PT LONG TERM GOAL #3   Title Perform ADL's with pain not > 3/10.    Time 6    Period Weeks    Status New      PT LONG TERM GOAL #4   Title Walk a community distance with pain not > 3/10.    Time 6    Period Weeks    Status New                 Plan - 07/24/20 1201    Clinical Impression Statement Patient responded well to the addition of theraband 4 way ankle exercises. Patient cued for eccentric control however still with some difficulties. Normal response to combo to left achilles tendon. Patient educated on iontophoresis and patient reported understanding and was able to recall removal time. No adverse effects upon removal of modalities.    Personal Factors and Comorbidities  Comorbidity 1;Comorbidity 2    Comorbidities Brian aneurysm, left foot bone spur removal, HTN, Thyroid problem.    Examination-Activity Limitations Locomotion Level;Other    Examination-Participation Restrictions Other    Stability/Clinical Decision Making Evolving/Moderate complexity    Clinical Decision Making Low    Rehab Potential Good    PT Frequency 2x / week    PT Duration 6 weeks    PT Treatment/Interventions ADLs/Self Care Home Management;Cryotherapy;Electrical Stimulation;Ultrasound;Moist Heat;Iontophoresis 4mg /ml Dexamethasone;Therapeutic activities;Therapeutic exercise;Manual techniques;Patient/family education;Dry needling;Joint Manipulations;Vasopneumatic Device    PT Next Visit Plan Iontophoresis to patient's left heel once cert is signed, pulsed combo e'stim/U/S, Gastroc-Soleus stretching, STW/M/IASTM, vasopneumatic and electrical stimulation.    Consulted and Agree with Plan of Care Patient           Patient will benefit from skilled therapeutic intervention in order to improve the following deficits and impairments:  Pain, Increased edema, Difficulty walking, Decreased activity tolerance, Decreased range of motion  Visit Diagnosis: Pain in left ankle and joints of left foot  Stiffness of left ankle, not elsewhere classified     Problem List Patient Active Problem List   Diagnosis Date Noted  . Primary insomnia 03/08/2020  . Controlled substance agreement signed 03/08/2020  . Situational anxiety 03/08/2020  . Varicose vein of leg 06/11/2019  . Osteopenia 06/11/2019  . Bleeding in brain due to brain aneurysm (HCC) 04/30/2019  . De Quervain's tenosynovitis, left 04/08/2019  . Seasonal allergic rhinitis 09/15/2018  . Essential hypertension 10/21/2013  . GERD (gastroesophageal reflux disease) 10/21/2013  . H/O discoid lupus erythematosus 10/21/2013  . Hyperlipidemia 10/21/2013  . Hypothyroidism 10/21/2013    10/23/2013, PT, DPT 07/24/2020, 12:04  PM  [facility] [address] [place], [place], [place] Phone: [phone]   Fax:  [phone]  Name: Leslie Hensley MRN: [name] Date of Birth: December 06, 1946

## 2020-07-26 ENCOUNTER — Other Ambulatory Visit: Payer: Self-pay

## 2020-07-26 ENCOUNTER — Ambulatory Visit: Payer: Medicare Other | Admitting: Physical Therapy

## 2020-07-26 DIAGNOSIS — M25572 Pain in left ankle and joints of left foot: Secondary | ICD-10-CM

## 2020-07-26 DIAGNOSIS — M25672 Stiffness of left ankle, not elsewhere classified: Secondary | ICD-10-CM | POA: Diagnosis not present

## 2020-07-26 NOTE — Therapy (Signed)
[facility] [address] [place], [place], [address] Phone: [phone]   Fax:  [phone]  Physical Therapy Treatment  Patient Details  Name: KAROLYNA BIANCHINI MRN: [id number] Date of Birth: 1946/01/22 Referring Provider (PT): Jacinto Reap DPM.   Encounter Date: 07/26/2020   PT End of Session - 07/26/20 1158    Visit Number 6    Number of Visits 12    Date for PT Re-Evaluation 10/08/20    Authorization Type FOTO.    PT Start Time 1117    PT Stop Time 1156    PT Time Calculation (min) 39 min    Activity Tolerance Patient tolerated treatment well    Behavior During Therapy WFL for tasks assessed/performed           Past Medical History:  Diagnosis Date   Brain aneurysm    Hyperlipidemia    Hypertension    Thyroid disease     Past Surgical History:  Procedure Laterality Date   ABDOMINAL HYSTERECTOMY     BRAIN SURGERY     aneurysm   FOOT SURGERY Left    spur removed   KIDNEY STONE SURGERY      There were no vitals filed for this visit.   Subjective Assessment - 07/26/20 1200    Subjective COVID-19 screen performed prior to patient entering clinic.  Heel is much better.  That patch really helped.    Pertinent History Brian aneurysm, left foot bone spur removal, HTN, Thyroid problem.    How long can you walk comfortably? Varies depending on terrain.    Patient Stated Goals Walk without pain.    Currently in Pain? Yes    Pain Location Ankle    Pain Orientation Left    Pain Descriptors / Indicators Dull;Aching    Pain Type Acute pain    Pain Onset More than a month ago                             [facility] Adult PT Treatment/Exercise - 07/26/20 0001      Exercises   Exercises Knee/Hip      Knee/Hip Exercises: Aerobic   Nustep Level 3 x 15 minutes.      Iontophoresis   Type of Iontophoresis Dexamethasone    Location Left Achilles    Dose 80 mA-Min.    Time 8      Vasopneumatic   Number  Minutes Vasopneumatic  15 minutes    Vasopnuematic Location  --   Right ankle.   Vasopneumatic Pressure Medium                       PT Long Term Goals - 07/10/20 1253      PT LONG TERM GOAL #1   Title Independent with a HEP.    Time 6    Period Weeks    Status New      PT LONG TERM GOAL #2   Title Increase left ankle dorsiflexion to 8-10 degrees to normalize the patients gait pattern.    Time 6    Period Weeks    Status New      PT LONG TERM GOAL #3   Title Perform ADL's with pain not > 3/10.    Time 6    Period Weeks    Status New      PT LONG TERM GOAL #4   Title Walk a community distance with pain  not > 3/10.    Time 6    Period Weeks    Status New                 Plan - 07/26/20 1205    Clinical Impression Statement Excellent response to treatments.  No left heel pain reported after treatment today.    Personal Factors and Comorbidities Comorbidity 1;Comorbidity 2    Comorbidities Brian aneurysm, left foot bone spur removal, HTN, Thyroid problem.    Examination-Activity Limitations Locomotion Level;Other    Examination-Participation Restrictions Other    Stability/Clinical Decision Making Evolving/Moderate complexity    Rehab Potential Good    PT Frequency 2x / week    PT Duration 6 weeks    PT Treatment/Interventions ADLs/Self Care Home Management;Cryotherapy;Electrical Stimulation;Ultrasound;Moist Heat;Iontophoresis 4mg /ml Dexamethasone;Therapeutic activities;Therapeutic exercise;Manual techniques;Patient/family education;Dry needling;Joint Manipulations;Vasopneumatic Device    PT Next Visit Plan Iontophoresis to patient's left heel once cert is signed, pulsed combo e'stim/U/S, Gastroc-Soleus stretching, STW/M/IASTM, vasopneumatic and electrical stimulation.    Consulted and Agree with Plan of Care Patient           Patient will benefit from skilled therapeutic intervention in order to improve the following deficits and impairments:   Pain, Increased edema, Difficulty walking, Decreased activity tolerance, Decreased range of motion  Visit Diagnosis: Pain in left ankle and joints of left foot  Stiffness of left ankle, not elsewhere classified     Problem List Patient Active Problem List   Diagnosis Date Noted   Primary insomnia 03/08/2020   Controlled substance agreement signed 03/08/2020   Situational anxiety 03/08/2020   Varicose vein of leg 06/11/2019   Osteopenia 06/11/2019   Bleeding in brain due to brain aneurysm (HCC) 04/30/2019   De Quervain's tenosynovitis, left 04/08/2019   Seasonal allergic rhinitis 09/15/2018   Essential hypertension 10/21/2013   GERD (gastroesophageal reflux disease) 10/21/2013   H/O discoid lupus erythematosus 10/21/2013   Hyperlipidemia 10/21/2013   Hypothyroidism 10/21/2013    Chaney Ingram, 10/23/2013 MPT 07/26/2020, 12:09 PM  [facility] [address] [place], [place], [place] Phone: [phone]   Fax:  [phone]  Name: MARKALA SITTS MRN: [name] Date of Birth: 03/22/1946

## 2020-07-31 ENCOUNTER — Ambulatory Visit: Payer: Medicare Other | Attending: Podiatry | Admitting: Physical Therapy

## 2020-07-31 ENCOUNTER — Other Ambulatory Visit: Payer: Self-pay

## 2020-07-31 DIAGNOSIS — M25672 Stiffness of left ankle, not elsewhere classified: Secondary | ICD-10-CM | POA: Diagnosis not present

## 2020-07-31 DIAGNOSIS — M25572 Pain in left ankle and joints of left foot: Secondary | ICD-10-CM | POA: Diagnosis not present

## 2020-07-31 NOTE — Patient Instructions (Addendum)
  Dorsiflexion: Resisted   Facing anchor, tubing around left foot, pull toward face.  Repeat _10___ times per set. Do __2__ sets per session. Do _2___ sessions per day.   Plantar Flexion: Resisted   Anchor behind, tubing around left foot, press down. Repeat __10__ times per set. Do __2__ sets per session. Do ___2_ sessions per day.   Inversion: Resisted   Cross legs with left leg underneath, foot in tubing loop. Hold tubing around other foot to resist and turn foot in. Repeat _10___ times per set. Do __2__ sets per session. Do _2___ sessions per day.   Eversion: Resisted   With left foot in tubing loop, hold tubing around other foot to resist and turn foot out. Repeat _10___ times per set. Do __2__ sets per session. Do __2__ sessions per day.    Stretching: Hamstring (Standing)    Place right foot on small stool. Slowly lean forward, keeping back straight, until stretch is felt in back of thigh. Hold __30__ seconds. Repeat __3__ times per set. Do __1-2__ sets per session. Do _2-4___ sessions per day.          Stretching: Calf - Towel    Sit with knee straight and towel looped around left foot. Gently pull on towel until stretch is felt in calf. Hold __20__ seconds. Repeat _1-2___ times per set. Do __1-2__ sets per session. Do ___1-2_ sessions per day.

## 2020-07-31 NOTE — Therapy (Signed)
[facility] [place], [place], [address] Phone: [phone]   Fax:  [phone]  Physical Therapy Treatment  Patient Details  Name: Leslie Hensley MRN: [id number] Date of Birth: [birth year] Referring Provider (PT): Loletta Specter DPM.   Encounter Date: 07/31/2020   PT End of Session - 07/31/20 1123    Visit Number 7    Number of Visits 12    Date for PT Re-Evaluation 10/08/20    Authorization Type FOTO.    PT Start Time 1118    PT Stop Time 1206    PT Time Calculation (min) 48 min    Activity Tolerance Patient tolerated treatment well    Behavior During Therapy WFL for tasks assessed/performed           Past Medical History:  Diagnosis Date  . Brain aneurysm   . Hyperlipidemia   . Hypertension   . Thyroid disease     Past Surgical History:  Procedure Laterality Date  . ABDOMINAL HYSTERECTOMY    . BRAIN SURGERY     aneurysm  . FOOT SURGERY Left    spur removed  . KIDNEY STONE SURGERY      There were no vitals filed for this visit.   Subjective Assessment - 07/31/20 1119    Subjective COVID-19 screen performed prior to patient entering clinic.  Patient arrived with no pain and doing well per reported    Pertinent History Aaron Edelman aneurysm, left foot bone spur removal, HTN, Thyroid problem.    How long can you walk comfortably? Varies depending on terrain.    Patient Stated Goals Walk without pain.    Currently in Pain? No/denies    Pain Location Ankle    Pain Orientation Left    Pain Onset More than a month ago              [facility] PT Assessment - 07/31/20 0001      AROM   AROM Assessment Site Ankle    Right/Left Ankle Left    Left Ankle Dorsiflexion 5   knee bent                        [facility] Adult PT Treatment/Exercise - 07/31/20 0001      Iontophoresis   Type of Iontophoresis Dexamethasone    Location Left Achilles    Dose 80 mA-Min 3 of 6    Time 8      Vasopneumatic   Number  Minutes Vasopneumatic  10 minutes    Vasopnuematic Location  Ankle    Vasopneumatic Pressure Medium      Ankle Exercises: Aerobic   Nustep L4 x 27mn UE/LE activity      Ankle Exercises: Supine   T-Band yellow theraband 4 way ankle x20 each      Ankle Exercises: Seated   Other Seated Ankle Exercises seated prostretch x313m      Ankle Exercises: Standing   Heel Raises Both;20 reps                  PT Education - 07/31/20 1157    Education Details HEP    Person(s) Educated Patient    Methods Explanation;Demonstration;Handout    Comprehension Verbalized understanding;Returned demonstration               PT Long Term Goals - 07/31/20 1127      PT LONG TERM GOAL #1   Title Independent with a HEP.    Baseline  issued initial today 07/31/20    Time 6    Period Weeks    Status On-going      PT LONG TERM GOAL #2   Title Increase left ankle dorsiflexion to 8-10 degrees to normalize the patient's gait pattern.    Baseline AROM 5 degrees w knee bent 07/31/20    Time 6    Period Weeks    Status On-going      PT LONG TERM GOAL #3   Title Perform ADL's with pain not > 3/10.    Baseline met 07/31/20 per reported    Time 6    Period Weeks    Status Achieved      PT LONG TERM GOAL #4   Title Walk a community distance with pain not > 3/10.    Baseline met 07/31/20 per reported    Time 6    Period Weeks    Status Achieved                 Plan - 07/31/20 1208    Clinical Impression Statement Patient tolerated treatment well today. Patient has reported no pain and is able to perorm all ADL's and walk a comminity distance with no pain, only minimal discomfort with inclines. Today issued HEP with education on daily stretches. Patient met LTG #3 and #4 today with others progressing. Patient continues to respond well to iont.    Personal Factors and Comorbidities Comorbidity 1;Comorbidity 2    Comorbidities Brian aneurysm, left foot bone spur removal, HTN, Thyroid  problem.    Examination-Activity Limitations Locomotion Level;Other    Examination-Participation Restrictions Other    Stability/Clinical Decision Making Evolving/Moderate complexity    Rehab Potential Good    PT Frequency 2x / week    PT Duration 6 weeks    PT Treatment/Interventions ADLs/Self Care Home Management;Cryotherapy;Electrical Stimulation;Ultrasound;Moist Heat;Iontophoresis 81m/ml Dexamethasone;Therapeutic activities;Therapeutic exercise;Manual techniques;Patient/family education;Dry needling;Joint Manipulations;Vasopneumatic Device    PT Next Visit Plan cont with POC for strengthening, stretching and ionto/Vaso    Consulted and Agree with Plan of Care Patient           Patient will benefit from skilled therapeutic intervention in order to improve the following deficits and impairments:  Pain, Increased edema, Difficulty walking, Decreased activity tolerance, Decreased range of motion  Visit Diagnosis: Pain in left ankle and joints of left foot  Stiffness of left ankle, not elsewhere classified     Problem List Patient Active Problem List   Diagnosis Date Noted  . Primary insomnia 03/08/2020  . Controlled substance agreement signed 03/08/2020  . Situational anxiety 03/08/2020  . Varicose vein of leg 06/11/2019  . Osteopenia 06/11/2019  . Bleeding in brain due to brain aneurysm ([place] 04/30/2019  . De Quervain's tenosynovitis, left 04/08/2019  . Seasonal allergic rhinitis 09/15/2018  . Essential hypertension 10/21/2013  . GERD (gastroesophageal reflux disease) 10/21/2013  . H/O discoid lupus erythematosus 10/21/2013  . Hyperlipidemia 10/21/2013  . Hypothyroidism 10/21/2013    Torie Towle P, PTA 07/31/2020, 12:12 PM  [facility]MR[place] [address]Phone: [phone]  Fax:  [phone] Name: Leslie TEAGUEMRN: [id number] of Birth: 701-05-1946

## 2020-08-02 ENCOUNTER — Ambulatory Visit: Payer: Medicare Other | Admitting: Physical Therapy

## 2020-08-03 ENCOUNTER — Other Ambulatory Visit: Payer: Self-pay

## 2020-08-03 ENCOUNTER — Ambulatory Visit: Payer: Medicare Other | Admitting: Physical Therapy

## 2020-08-03 DIAGNOSIS — M25572 Pain in left ankle and joints of left foot: Secondary | ICD-10-CM

## 2020-08-03 DIAGNOSIS — M25672 Stiffness of left ankle, not elsewhere classified: Secondary | ICD-10-CM | POA: Diagnosis not present

## 2020-08-03 NOTE — Therapy (Signed)
[facility] [place], [place], [address] Phone: [phone]   Fax:  [phone]  Physical Therapy Treatment  Patient Details  Name: Leslie Hensley MRN: [id number] Date of Birth: Feb 07, 1946 Referring Provider (PT): Loletta Specter DPM.   Encounter Date: 08/03/2020   PT End of Session - 08/03/20 1515    Visit Number 8    Number of Visits 12    Date for PT Re-Evaluation 10/08/20    Authorization Type FOTO.    PT Start Time 0230    PT Stop Time 0307    PT Time Calculation (min) 37 min    Activity Tolerance Patient tolerated treatment well    Behavior During Therapy [facility] for tasks assessed/performed           Past Medical History:  Diagnosis Date  . Brain aneurysm   . Hyperlipidemia   . Hypertension   . Thyroid disease     Past Surgical History:  Procedure Laterality Date  . ABDOMINAL HYSTERECTOMY    . BRAIN SURGERY     aneurysm  . FOOT SURGERY Left    spur removed  . KIDNEY STONE SURGERY      There were no vitals filed for this visit.   Subjective Assessment - 08/03/20 1501    Subjective COVID-19 screen performed prior to patient entering clinic.  Doing much better.  An occasional sharp pain but otherwise very good.    Pertinent History Brian aneurysm, left foot bone spur removal, HTN, Thyroid problem.    How long can you walk comfortably? Varies depending on terrain.    Patient Stated Goals Walk without pain.                             [facility] Adult PT Treatment/Exercise - 08/03/20 0001      Exercises   Exercises Knee/Hip      Knee/Hip Exercises: Aerobic   Nustep Level 3 x 10 minutes.      Modalities   Modalities Electrical engineer Stimulation Location Left post heel.    Electrical Stimulation Action Pre-mod.    Electrical Stimulation Parameters 80-150 hz x 15 minutes.    Electrical Stimulation Goals Edema;Pain      Iontophoresis    Type of Iontophoresis Dexamethasone    Location Left Achilles    Dose 80 mA-Min 4 of 6.    Time 8.      Vasopneumatic   Number Minutes Vasopneumatic  15 minutes    Vasopnuematic Location  --   Left Achilles.   Vasopneumatic Pressure Low                       PT Long Term Goals - 07/31/20 1127      PT LONG TERM GOAL #1   Title Independent with a HEP.    Baseline issued initial today 07/31/20    Time 6    Period Weeks    Status On-going      PT LONG TERM GOAL #2   Title Increase left ankle dorsiflexion to 8-10 degrees to normalize the patient's gait pattern.    Baseline AROM 5 degrees w knee bent 07/31/20    Time 6    Period Weeks    Status On-going      PT LONG TERM GOAL #3   Title Perform ADL's with pain not > 3/10.  Baseline met 07/31/20 per reported    Time 6    Period Weeks    Status Achieved      PT LONG TERM GOAL #4   Title Walk a community distance with pain not > 3/10.    Baseline met 07/31/20 per reported    Time 6    Period Weeks    Status Achieved                 Plan - 08/03/20 1457    Clinical Impression Statement Excellent progress thus far.  FOTO score has improved to better than predicted to a 23%.  Patient very pleased with progress and thinks she may make next week her last week.    Personal Factors and Comorbidities Comorbidity 1;Comorbidity 2    Comorbidities Brian aneurysm, left foot bone spur removal, HTN, Thyroid problem.    Examination-Activity Limitations Locomotion Level;Other    Examination-Participation Restrictions Other    Stability/Clinical Decision Making Evolving/Moderate complexity    Rehab Potential Good    PT Frequency 2x / week    PT Duration 6 weeks    PT Treatment/Interventions ADLs/Self Care Home Management;Cryotherapy;Electrical Stimulation;Ultrasound;Moist Heat;Iontophoresis 16m/ml Dexamethasone;Therapeutic activities;Therapeutic exercise;Manual techniques;Patient/family education;Dry needling;Joint  Manipulations;Vasopneumatic Device    PT Next Visit Plan cont with POC for strengthening, stretching and ionto/Vaso    Consulted and Agree with Plan of Care Patient           Patient will benefit from skilled therapeutic intervention in order to improve the following deficits and impairments:  Pain, Increased edema, Difficulty walking, Decreased activity tolerance, Decreased range of motion  Visit Diagnosis: Pain in left ankle and joints of left foot  Stiffness of left ankle, not elsewhere classified     Problem List Patient Active Problem List   Diagnosis Date Noted  . Primary insomnia 03/08/2020  . Controlled substance agreement signed 03/08/2020  . Situational anxiety 03/08/2020  . Varicose vein of leg 06/11/2019  . Osteopenia 06/11/2019  . Bleeding in brain due to brain aneurysm ([place] 04/30/2019  . De Quervain's tenosynovitis, left 04/08/2019  . Seasonal allergic rhinitis 09/15/2018  . Essential hypertension 10/21/2013  . GERD (gastroesophageal reflux disease) 10/21/2013  . H/O discoid lupus erythematosus 10/21/2013  . Hyperlipidemia 10/21/2013  . Hypothyroidism 10/21/2013    Leslie Hensley, CMaliMPT 08/03/2020, 3:17 PM  [facility]483 [address]MOld [place] [address]Phone: [phone]  Fax:  [phone] Name: Leslie Hensley: [id number] of Birth: [birth year]

## 2020-08-07 ENCOUNTER — Encounter: Payer: Medicare Other | Admitting: Physical Therapy

## 2020-08-09 ENCOUNTER — Ambulatory Visit: Payer: Medicare Other | Admitting: Physical Therapy

## 2020-08-09 ENCOUNTER — Other Ambulatory Visit: Payer: Self-pay

## 2020-08-09 DIAGNOSIS — M25572 Pain in left ankle and joints of left foot: Secondary | ICD-10-CM | POA: Diagnosis not present

## 2020-08-09 DIAGNOSIS — M25672 Stiffness of left ankle, not elsewhere classified: Secondary | ICD-10-CM | POA: Diagnosis not present

## 2020-08-09 NOTE — Therapy (Addendum)
[facility] [place], [place], [address] Phone: [phone]   Fax:  [phone]  Physical Therapy Treatment  Patient Details  Name: Leslie Hensley MRN: [id number] Date of Birth: Dec 22, 1946 Referring Provider (PT): Loletta Specter DPM.   Encounter Date: 08/09/2020   PT End of Session - 08/09/20 1120     Visit Number 9    Number of Visits 12    Date for PT Re-Evaluation 10/08/20    Authorization Type FOTO.    PT Start Time 1115    PT Stop Time 1152    PT Time Calculation (min) 37 min    Activity Tolerance Patient tolerated treatment well    Behavior During Therapy WFL for tasks assessed/performed             Past Medical History:  Diagnosis Date   Brain aneurysm    Hyperlipidemia    Hypertension    Thyroid disease     Past Surgical History:  Procedure Laterality Date   ABDOMINAL HYSTERECTOMY     BRAIN SURGERY     aneurysm   FOOT SURGERY Left    spur removed   KIDNEY STONE SURGERY      There were no vitals filed for this visit.   Subjective Assessment - 08/09/20 1120     Subjective COVID-19 screen performed prior to patient entering clinic.  Patient arrived with no pain and doing well    Pertinent History Brian aneurysm, left foot bone spur removal, HTN, Thyroid problem.    How long can you walk comfortably? Varies depending on terrain.    Patient Stated Goals Walk without pain.    Currently in Pain? No/denies                               [facility] Adult PT Treatment/Exercise - 08/09/20 0001       Iontophoresis   Type of Iontophoresis Dexamethasone    Location Left Achilles    Dose 80 mA-Min 5 of 6.    Time 8.      Vasopneumatic   Number Minutes Vasopneumatic  10 minutes    Vasopnuematic Location  Ankle    Vasopneumatic Pressure Low      Ankle Exercises: Standing   Rocker Board 4 minutes   with holds for stretch   Heel Raises Both;20 reps      Ankle Exercises: Aerobic    Nustep L4 x 35mn UE/LE activity   monitored for progression                        PT Long Term Goals - 08/09/20 1121       PT LONG TERM GOAL #1   Title Independent with a HEP.    Baseline met 08/09/20    Time 6    Period Weeks    Status Achieved      PT LONG TERM GOAL #2   Title Increase left ankle dorsiflexion to 8-10 degrees to normalize the patients gait pattern.    Baseline AROM 10 degrees 07/31/20    Time 6    Period Weeks    Status Achieved      PT LONG TERM GOAL #3   Title Perform ADL's with pain not > 3/10.    Baseline met 07/31/20 per reported    Time 6    Period Weeks    Status Achieved  PT LONG TERM GOAL #4   Title Walk a community distance with pain not > 3/10.    Baseline met 07/31/20 per reported    Time 6    Period Weeks    Status Achieved                   Plan - 08/09/20 1138     Clinical Impression Statement Patient has met all current goals and would like to be but on hold to DC. Patient is doing well with HEP and ADL"s. No pain pre or post treatment today.    Personal Factors and Comorbidities Comorbidity 1;Comorbidity 2    Comorbidities Brian aneurysm, left foot bone spur removal, HTN, Thyroid problem.    Examination-Activity Limitations Locomotion Level;Other    Examination-Participation Restrictions Other    Stability/Clinical Decision Making Evolving/Moderate complexity    Rehab Potential Good    PT Frequency 2x / week    PT Duration 6 weeks    PT Treatment/Interventions ADLs/Self Care Home Management;Cryotherapy;Electrical Stimulation;Ultrasound;Moist Heat;Iontophoresis 73m/ml Dexamethasone;Therapeutic activities;Therapeutic exercise;Manual techniques;Patient/family education;Dry needling;Joint Manipulations;Vasopneumatic Device    PT Next Visit Plan on hold to DC    Consulted and Agree with Plan of Care Patient             Patient will benefit from skilled therapeutic intervention in order to improve the  following deficits and impairments:  Pain, Increased edema, Difficulty walking, Decreased activity tolerance, Decreased range of motion  Visit Diagnosis: Pain in left ankle and joints of left foot  Stiffness of left ankle, not elsewhere classified     Problem List Patient Active Problem List   Diagnosis Date Noted   Primary insomnia 03/08/2020   Controlled substance agreement signed 03/08/2020   Situational anxiety 03/08/2020   Varicose vein of leg 06/11/2019   Osteopenia 06/11/2019   Bleeding in brain due to brain aneurysm ([place] 04/30/2019   De Quervain's tenosynovitis, left 04/08/2019   Seasonal allergic rhinitis 09/15/2018   Essential hypertension 10/21/2013   GERD (gastroesophageal reflux disease) 10/21/2013   H/O discoid lupus erythematosus 10/21/2013   Hyperlipidemia 10/21/2013   Hypothyroidism 10/21/2013    Havier Deeb P, PTA 08/09/2020, 11:53 AM  [facility] [address] [place] [address]Phone: [phone]  Fax:  [phone] Name: Leslie SLATTENMRN: [id number] of Birth: 7February 20, 1947 PHYSICAL THERAPY DISCHARGE SUMMARY  Visits from Start of Care: 9.  Current functional level related to goals / functional outcomes: See above.   Remaining deficits: All goals met.   Education / Equipment: HEP.   Patient agrees to discharge. Patient goals were met. Patient is being discharged due to meeting the stated rehab goals.    CMaliApplegate MPT

## 2020-08-14 ENCOUNTER — Encounter: Payer: Medicare Other | Admitting: Physical Therapy

## 2020-08-16 ENCOUNTER — Encounter: Payer: Medicare Other | Admitting: Physical Therapy

## 2020-08-24 ENCOUNTER — Other Ambulatory Visit: Payer: Self-pay | Admitting: Family Medicine

## 2020-08-24 DIAGNOSIS — G4762 Sleep related leg cramps: Secondary | ICD-10-CM

## 2020-09-06 DIAGNOSIS — M7662 Achilles tendinitis, left leg: Secondary | ICD-10-CM | POA: Diagnosis not present

## 2020-09-06 DIAGNOSIS — M79672 Pain in left foot: Secondary | ICD-10-CM | POA: Diagnosis not present

## 2020-09-28 DIAGNOSIS — I609 Nontraumatic subarachnoid hemorrhage, unspecified: Secondary | ICD-10-CM | POA: Diagnosis not present

## 2020-09-28 DIAGNOSIS — Z9889 Other specified postprocedural states: Secondary | ICD-10-CM | POA: Diagnosis not present

## 2020-09-28 DIAGNOSIS — S065X0D Traumatic subdural hemorrhage without loss of consciousness, subsequent encounter: Secondary | ICD-10-CM | POA: Diagnosis not present

## 2020-10-18 ENCOUNTER — Other Ambulatory Visit: Payer: Self-pay | Admitting: Family Medicine

## 2020-10-18 DIAGNOSIS — G4762 Sleep related leg cramps: Secondary | ICD-10-CM

## 2020-11-03 ENCOUNTER — Other Ambulatory Visit: Payer: Self-pay | Admitting: *Deleted

## 2020-11-03 DIAGNOSIS — G4762 Sleep related leg cramps: Secondary | ICD-10-CM

## 2020-11-03 MED ORDER — ROPINIROLE HCL 0.25 MG PO TABS: ORAL_TABLET | ORAL | 1 refills | Status: DC

## 2020-11-09 ENCOUNTER — Other Ambulatory Visit: Payer: Medicare Other

## 2020-11-09 DIAGNOSIS — R300 Dysuria: Secondary | ICD-10-CM | POA: Diagnosis not present

## 2020-11-09 LAB — URINALYSIS, COMPLETE
Bilirubin, UA: NEGATIVE
Glucose, UA: NEGATIVE
Ketones, UA: NEGATIVE
Nitrite, UA: POSITIVE — AB
Protein,UA: NEGATIVE
RBC, UA: NEGATIVE
Specific Gravity, UA: 1.02 (ref 1.005–1.030)
Urobilinogen, Ur: 0.2 mg/dL (ref 0.2–1.0)
pH, UA: 6.5 (ref 5.0–7.5)

## 2020-11-09 LAB — MICROSCOPIC EXAMINATION
Epithelial Cells (non renal): NONE SEEN /hpf (ref 0–10)
RBC, Urine: NONE SEEN /hpf (ref 0–2)

## 2020-11-10 ENCOUNTER — Ambulatory Visit (INDEPENDENT_AMBULATORY_CARE_PROVIDER_SITE_OTHER): Payer: Medicare Other | Admitting: Family

## 2020-11-10 ENCOUNTER — Encounter: Payer: Self-pay | Admitting: Family

## 2020-11-10 ENCOUNTER — Telehealth: Payer: Self-pay | Admitting: Family Medicine

## 2020-11-10 DIAGNOSIS — R399 Unspecified symptoms and signs involving the genitourinary system: Secondary | ICD-10-CM

## 2020-11-10 MED ORDER — CEPHALEXIN 500 MG PO CAPS: 500.0000 mg | ORAL_CAPSULE | Freq: Two times a day (BID) | ORAL | 0 refills | Status: DC

## 2020-11-10 NOTE — Telephone Encounter (Signed)
Appointment scheduled with [facility] today.

## 2020-11-10 NOTE — Telephone Encounter (Signed)
Called patient

## 2020-11-10 NOTE — Progress Notes (Signed)
   Virtual Visit via telephone Note Due to COVID-19 pandemic this visit was conducted virtually. This visit type was conducted due to national recommendations for restrictions regarding the COVID-19 Pandemic (e.g. social distancing, sheltering in place) in an effort to limit this patient's exposure and mitigate transmission in our community. All issues noted in this document were discussed and addressed.  A physical exam was not performed with this format.  I connected with Leslie Hensley on 11/10/20 at 12:42 pm  by telephone and verified that I am speaking with the correct person using two identifiers. Leslie Hensley is currently located at [employer] [employer]  and family is currently with her during visit. The provider, Jannifer Rodney, FNP is located in their office at time of visit.  I discussed the limitations, risks, security and privacy concerns of performing an evaluation and management service by telephone and the availability of in person appointments. I also discussed with the patient that there may be a patient responsible charge related to this service. The patient expressed understanding and agreed to proceed.   History and Present Illness:  Urinary Tract Infection  This is a new problem. The current episode started 1 to 4 weeks ago. The pain is at a severity of 0/10. The patient is experiencing no pain. There has been no fever. Associated symptoms include frequency. Pertinent negatives include no discharge, flank pain, hematuria, hesitancy, nausea, urgency or vomiting. Associated symptoms comments: Foul odor. She has tried increased fluids for the symptoms. The treatment provided mild relief.      Review of Systems  Gastrointestinal: Negative for nausea and vomiting.  Genitourinary: Positive for frequency. Negative for flank pain, hematuria, hesitancy and urgency.  All other systems reviewed and are negative.    Observations/Objective: No SOB or distress noted   Assessment and  Plan: 1. UTI symptoms Force fluids RTO if symptoms worsen or do not improve  Culture pending - cephALEXin (KEFLEX) 500 MG capsule; Take 1 capsule (500 mg total) by mouth 2 (two) times daily.  Dispense: 14 capsule; Refill: 0     I discussed the assessment and treatment plan with the patient. The patient was provided an opportunity to ask questions and all were answered. The patient agreed with the plan and demonstrated an understanding of the instructions.   The patient was advised to call back or seek an in-person evaluation if the symptoms worsen or if the condition fails to improve as anticipated.  The above assessment and management plan was discussed with the patient. The patient verbalized understanding of and has agreed to the management plan. Patient is aware to call the clinic if symptoms persist or worsen. Patient is aware when to return to the clinic for a follow-up visit. Patient educated on when it is appropriate to go to the emergency department.   Time call ended: 12:53 pm    I provided 11 minutes of non-face-to-face time during this encounter.    Jannifer Rodney, FNP

## 2020-11-13 LAB — URINE CULTURE

## 2020-11-14 ENCOUNTER — Telehealth: Payer: Self-pay

## 2020-11-14 NOTE — Telephone Encounter (Signed)
  Prescription Request  11/14/2020  What is the name of the medication or equipment? rOPINIRole (REQUIP) 0.25 MG tablet    Have you contacted your pharmacy to request a refill? (if applicable) Yes  Which pharmacy would you like this sent to[facility]   Patient notified that their request is being sent to the clinical staff for review and that they should receive a response within 2 business days.

## 2020-11-14 NOTE — Telephone Encounter (Signed)
Pt aware to call the pharmacy and talk to a pharmacist as a refill was sent in on 11/03/20 with 1 refill.

## 2020-11-16 ENCOUNTER — Telehealth: Payer: Self-pay

## 2020-11-16 NOTE — Telephone Encounter (Signed)
Patient had tele visit last week for UTI.  She finishes her antibiotic tomorrow.  She would like to bring a specimen in one day next week just to retest and make sure it has cleared.  Is this okay?

## 2020-11-17 NOTE — Telephone Encounter (Signed)
Unless she is having symptoms, I do not recommend retesting.  UA may not show total clear urine for a few weeks following UTI.  Plus, not really indicated.  Symptoms should determine need for testing.

## 2020-11-17 NOTE — Telephone Encounter (Signed)
Patient aware.

## 2020-11-30 DIAGNOSIS — Z1231 Encounter for screening mammogram for malignant neoplasm of breast: Secondary | ICD-10-CM | POA: Diagnosis not present

## 2020-12-01 ENCOUNTER — Other Ambulatory Visit: Payer: Self-pay | Admitting: Family Medicine

## 2020-12-01 DIAGNOSIS — F418 Other specified anxiety disorders: Secondary | ICD-10-CM

## 2020-12-01 DIAGNOSIS — F5101 Primary insomnia: Secondary | ICD-10-CM

## 2020-12-06 DIAGNOSIS — Z0120 Encounter for dental examination and cleaning without abnormal findings: Secondary | ICD-10-CM | POA: Diagnosis not present

## 2021-01-02 ENCOUNTER — Other Ambulatory Visit: Payer: Self-pay | Admitting: *Deleted

## 2021-01-02 DIAGNOSIS — F5101 Primary insomnia: Secondary | ICD-10-CM

## 2021-01-02 DIAGNOSIS — F418 Other specified anxiety disorders: Secondary | ICD-10-CM

## 2021-01-04 ENCOUNTER — Ambulatory Visit (INDEPENDENT_AMBULATORY_CARE_PROVIDER_SITE_OTHER): Payer: Medicare Other | Admitting: *Deleted

## 2021-01-04 DIAGNOSIS — Z0000 Encounter for general adult medical examination without abnormal findings: Secondary | ICD-10-CM

## 2021-01-04 NOTE — Progress Notes (Signed)
MEDICARE ANNUAL WELLNESS VISIT  01/04/2021  Telephone Visit Disclaimer This Medicare AWV was conducted by telephone due to national recommendations for restrictions regarding the COVID-19 Pandemic (e.g. social distancing).  I verified, using two identifiers, that I am speaking with Leslie Hensley or their authorized healthcare agent. I discussed the limitations, risks, security, and privacy concerns of performing an evaluation and management service by telephone and the potential availability of an in-person appointment in the future. The patient expressed understanding and agreed to proceed.  Location of Patient: home Location of Provider (nurse):  office  Subjective:    Leslie Hensley is a 75 y.o. female patient of Leslie Ip, DO who had a Medicare Annual Wellness Visit today via telephone. 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 she has 2 living children and her oldest son passed away this past year. she reports that she is socially active and does interact with friends/family regularly. she is markedly physically active and enjoys spending time with family, doing yard work, working on things around the house and staying active in [place].  Patient Care Team: Leslie Ip, DO as PCP - General (Family Medicine)  Advanced Directives 01/04/2021 07/10/2020 12/21/2019  Does Patient Have a Medical Advance Directive? No Yes No  Would patient like information on creating a medical advance directive? No - Patient declined - No - Patient declined    Hospital Utilization Over the Past 12 Months: # of hospitalizations or ER visits: 1 # of surgeries: 1  Review of Systems    Patient reports that her overall health is better compared to last year.  History obtained from the patient  Patient Reported Readings (BP, Pulse, CBG, Weight, etc) none  Pain Assessment Pain : No/denies pain     Current Medications & Allergies  (verified) Allergies as of 01/04/2021      Reactions   Codeine Hives   Cyclobenzaprine Swelling   Diclofenac Sodium Rash      Medication List       Accurate as of January 04, 2021 10:23 AM. If you have any questions, ask your nurse or doctor.        STOP taking these medications   cephALEXin 500 MG capsule Commonly known as: KEFLEX     TAKE these medications   ascorbic acid 100 MG tablet Commonly known as: VITAMIN C Take by mouth.   hydrochlorothiazide 25 MG tablet Commonly known as: HYDRODIURIL Take 1 tablet (25 mg total) by mouth daily.   levothyroxine 75 MCG tablet Commonly known as: SYNTHROID Take 1 tablet (75 mcg total) by mouth daily.   mirtazapine 15 MG tablet Commonly known as: REMERON Take 1 tablet (15 mg total) by mouth at bedtime. (Needs to be seen before next refill)   Omega-3 1000 MG Caps Take by mouth.   rOPINIRole 0.25 MG tablet Commonly known as: REQUIP Take 1 to 3 tablets by mouth at bedtime.   simvastatin 20 MG tablet Commonly known as: ZOCOR Take 1 tablet (20 mg total) by mouth daily.   Vitamin D3 25 MCG (1000 UT) Caps Take by mouth.   Vitamin E 400 units Tabs Take by mouth.       History (reviewed): Past Medical History:  Diagnosis Date  . Brain aneurysm   . Hyperlipidemia   . Hypertension   . Thyroid disease    Past Surgical History:  Procedure Laterality Date  . ABDOMINAL HYSTERECTOMY    .  BRAIN SURGERY     aneurysm  . FOOT SURGERY Left    spur removed  . KIDNEY STONE SURGERY     Family History  Problem Relation Age of Onset  . Heart disease Mother   . Heart disease Father   . Stroke Father   . Heart disease Sister   . Heart disease Brother   . Diabetes Brother   . Heart disease Brother   . Diabetes Brother   . Stroke Brother   . Cancer Brother    Social History   Socioeconomic History  . Marital status: Divorced    Spouse name: Not on file  . Number of children: 3  . Years of education: 10  . Highest  education level: 10th grade  Occupational History  . Occupation: retired  Tobacco Use  . Smoking status: Former Smoker    Types: Cigarettes    Quit date: 12/21/1999    Years since quitting: 21.0  . Smokeless tobacco: Never Used  Vaping Use  . Vaping Use: Never used  Substance and Sexual Activity  . Alcohol use: Not Currently  . Drug use: Not Currently  . Sexual activity: Not Currently    Birth control/protection: Surgical  Other Topics Concern  . Not on file  Social History Narrative  . Not on file   Social Determinants of Health   Financial Resource Strain: Low Risk   . Difficulty of Paying Living Expenses: Not hard at all  Food Insecurity: No Food Insecurity  . Worried About Charity fundraiser in the Last Year: Never true  . Ran Out of Food in the Last Year: Never true  Transportation Needs: No Transportation Needs  . Lack of Transportation (Medical): No  . Lack of Transportation (Non-Medical): No  Physical Activity: Sufficiently Active  . Days of Exercise per Week: 7 days  . Minutes of Exercise per Session: 30 min  Stress: No Stress Concern Present  . Feeling of Stress : Not at all  Social Connections: Moderately Integrated  . Frequency of Communication with Friends and Family: More than three times a week  . Frequency of Social Gatherings with Friends and Family: More than three times a week  . Attends Religious Services: More than 4 times per year  . Active Member of Clubs or Organizations: Yes  . Attends Archivist Meetings: More than 4 times per year  . Marital Status: Divorced    Activities of Daily Living In your present state of health, do you have any difficulty performing the following activities: 01/04/2021  Hearing? N  Vision? N  Difficulty concentrating or making decisions? N  Walking or climbing stairs? N  Dressing or bathing? N  Doing errands, shopping? N  Preparing Food and eating ? N  Using the Toilet? N  In the past six months, have  you accidently leaked urine? Y  Comment sometimes with coughing and sneezing-wears a pad  Do you have problems with loss of bowel control? N  Managing your Medications? N  Managing your Finances? N  Housekeeping or managing your Housekeeping? N  Some recent data might be hidden    Patient Education/ Literacy How often do you need to have someone help you when you read instructions, pamphlets, or other written materials from your doctor or pharmacy?: 1 - Never What is the last grade level you completed in school?: 10th grade  Exercise Current Exercise Habits: Home exercise routine, Type of exercise: walking, Time (Minutes): 30, Frequency (Times/Week): 7, Weekly  Exercise (Minutes/Week): 210, Intensity: Mild, Exercise limited by: orthopedic condition(s)  Diet Patient reports consuming 3 meals a day and 2 snack(s) a day Patient reports that her primary diet is: Regular Patient reports that she does have regular access to food.   Depression Screen PHQ 2/9 Scores 01/04/2021 04/19/2020 03/24/2020 03/08/2020 12/21/2019 12/02/2019 06/24/2019  PHQ - 2 Score 1 6 1 1  0 0 0  PHQ- 9 Score - 9 - - - - 0     Fall Risk Fall Risk  01/04/2021 03/24/2020 03/08/2020 12/21/2019 12/02/2019  Falls in the past year? 0 0 1 1 1   Number falls in past yr: - - 0 0 0  Injury with Fall? - - 0 0 1  Follow up - - Education provided - -     Objective:  14/02/2019 seemed alert and oriented and she participated appropriately during our telephone visit.  Blood Pressure Weight BMI  BP Readings from Last 3 Encounters:  04/19/20 122/66  03/24/20 117/63  03/08/20 (!) 104/57   Wt Readings from Last 3 Encounters:  04/19/20 211 lb (95.7 kg)  03/24/20 209 lb (94.8 kg)  03/08/20 210 lb (95.3 kg)   BMI Readings from Last 1 Encounters:  04/19/20 36.22 kg/m    *Unable to obtain current vital signs, weight, and BMI due to telephone visit type  Hearing/Vision  . Alijah did not seem to have difficulty with  hearing/understanding during the telephone conversation . Reports that she has not had a formal eye exam by an eye care professional within the past year . Reports that she has not had a formal hearing evaluation within the past year *Unable to fully assess hearing and vision during telephone visit type  Cognitive Function: 6CIT Screen 01/04/2021 12/21/2019  What Year? 0 points 0 points  What month? 0 points 0 points  What time? 0 points 0 points  Count back from 20 0 points 0 points  Months in reverse 2 points 2 points  Repeat phrase 0 points 0 points  Total Score 2 2   (Normal:0-7, Significant for Dysfunction: >8)  Normal Cognitive Function Screening: Yes   Immunization & Health Maintenance Record Immunization History  Administered Date(s) Administered  . Td 06/24/2019    Health Maintenance  Topic Date Due  . COVID-19 Vaccine (1) Never done  . COLONOSCOPY (Pts 45-46yrs Insurance coverage will need to be confirmed)  Never done  . PNA vac Low Risk Adult (1 of 2 - PCV13) Never done  . Hepatitis C Screening  03/08/2021 (Originally 1946-09-26)  . INFLUENZA VACCINE  05/19/2021 (Originally 07/30/2020)  . MAMMOGRAM  11/29/2021  . TETANUS/TDAP  06/23/2029  . DEXA SCAN  Completed       Assessment  This is a routine wellness examination for 14/12/2020.  Health Maintenance: Due or Overdue Health Maintenance Due  Topic Date Due  . COVID-19 Vaccine (1) Never done  . COLONOSCOPY (Pts 45-64yrs Insurance coverage will need to be confirmed)  Never done  . PNA vac Low Risk Adult (1 of 2 - PCV13) Never done    Leslie Hensley does not need a referral for Community Assistance: Care Management:   no Social Work:    no Prescription Assistance:  no Nutrition/Diabetes Education:  no   Plan:  Personalized Goals Goals Addressed            This Visit's Progress   . DIET - EAT MORE FRUITS AND VEGETABLES        Personalized  Health Maintenance & Screening Recommendations   Pneumococcal vaccine  Influenza vaccine Shingrix vaccine  COVID vaccine  Lung Cancer Screening Recommended: no (Low Dose CT Chest recommended if Age 74-80 years, 30 pack-year currently smoking OR have quit w/in past 15 years) Hepatitis C Screening recommended: no HIV Screening recommended: no  Advanced Directives: Written information was not prepared per patient's request.  Referrals & Orders No orders of the defined types were placed in this encounter.   Follow-up Plan . Follow-up with Leslie Ip, DO as planned . Consider Flu, Prevnar, Shingrix and COVID vaccines   I have personally reviewed and noted the following in the patient's chart:   . Medical and social history . Use of alcohol, tobacco or illicit drugs  . Current medications and supplements . Functional ability and status . Nutritional status . Physical activity . Advanced directives . List of other physicians . Hospitalizations, surgeries, and ER visits in previous 12 months . Vitals . Screenings to include cognitive, depression, and falls . Referrals and appointments  In addition, I have reviewed and discussed with Leslie Hensley certain preventive protocols, quality metrics, and best practice recommendations. A written personalized care plan for preventive services as well as general preventive health recommendations is available and can be mailed to the patient at her request.      Hessie Diener, LPN  05/05/8977

## 2021-01-04 NOTE — Patient Instructions (Signed)
  Leslie Hensley , Thank you for taking time to come for your Medicare Wellness Visit. I appreciate your ongoing commitment to your health goals. Please review the following plan we discussed and let me know if I can assist you in the future.   These are the goals we discussed: Goals    . DIET - EAT MORE FRUITS AND VEGETABLES    . DIET - INCREASE WATER INTAKE     Try to drink 6-8 glasses of water daily       This is a list of the screening recommended for you and due dates:  Health Maintenance  Topic Date Due  . COVID-19 Vaccine (1) Never done  . Colon Cancer Screening  Never done  . Pneumonia vaccines (1 of 2 - PCV13) Never done  .  Hepatitis C: One time screening is recommended by Center for Disease Control  (CDC) for  adults born from [birth year] through 1964.   03/08/2021*  . Flu Shot  05/19/2021*  . Mammogram  11/29/2021  . Tetanus Vaccine  06/23/2029  . DEXA scan (bone density measurement)  Completed  *Topic was postponed. The date shown is not the original due date.

## 2021-01-19 ENCOUNTER — Telehealth: Payer: Self-pay | Admitting: Family Medicine

## 2021-01-19 DIAGNOSIS — F418 Other specified anxiety disorders: Secondary | ICD-10-CM

## 2021-01-19 DIAGNOSIS — F5101 Primary insomnia: Secondary | ICD-10-CM

## 2021-01-19 MED ORDER — MIRTAZAPINE 15 MG PO TABS: 15.0000 mg | ORAL_TABLET | Freq: Every day | ORAL | 1 refills | Status: DC

## 2021-01-19 NOTE — Telephone Encounter (Signed)
Pt aware refill sent to pharmacy. Pt has appt on 03/02/21

## 2021-01-19 NOTE — Telephone Encounter (Signed)
  Prescription Request  01/19/2021  What is the name of the medication or equipment? Mirtazapine   Have you contacted your pharmacy to request a refill? (if applicable) yes  Which pharmacy would you like this sent to? [facility]   Patient notified that their request is being sent to the clinical staff for review and that they should receive a response within 2 business days.

## 2021-01-29 ENCOUNTER — Other Ambulatory Visit: Payer: Self-pay | Admitting: *Deleted

## 2021-01-29 MED ORDER — LEVOTHYROXINE SODIUM 75 MCG PO TABS: 75.0000 ug | ORAL_TABLET | Freq: Every day | ORAL | 0 refills | Status: DC

## 2021-01-29 MED ORDER — SIMVASTATIN 20 MG PO TABS: 20.0000 mg | ORAL_TABLET | Freq: Every day | ORAL | 0 refills | Status: DC

## 2021-02-13 ENCOUNTER — Ambulatory Visit (INDEPENDENT_AMBULATORY_CARE_PROVIDER_SITE_OTHER): Payer: Medicare Other | Admitting: Nurse Practitioner

## 2021-02-13 ENCOUNTER — Other Ambulatory Visit: Payer: Self-pay

## 2021-02-13 ENCOUNTER — Encounter: Payer: Self-pay | Admitting: Nurse Practitioner

## 2021-02-13 VITALS — BP 137/67 | HR 74 | Temp 97.1°F | Ht 64.0 in | Wt 220.2 lb

## 2021-02-13 DIAGNOSIS — R300 Dysuria: Secondary | ICD-10-CM | POA: Insufficient documentation

## 2021-02-13 LAB — URINALYSIS, COMPLETE
Bilirubin, UA: NEGATIVE
Glucose, UA: NEGATIVE
Ketones, UA: NEGATIVE
Nitrite, UA: NEGATIVE
Protein,UA: NEGATIVE
RBC, UA: NEGATIVE
Specific Gravity, UA: 1.02 (ref 1.005–1.030)
Urobilinogen, Ur: 0.2 mg/dL (ref 0.2–1.0)
pH, UA: 6 (ref 5.0–7.5)

## 2021-02-13 LAB — MICROSCOPIC EXAMINATION
Epithelial Cells (non renal): NONE SEEN /hpf (ref 0–10)
RBC, Urine: NONE SEEN /hpf (ref 0–2)

## 2021-02-13 MED ORDER — CEFTRIAXONE SODIUM 1 G IJ SOLR
1.0000 g | Freq: Once | INTRAMUSCULAR | Status: AC
  Administered 2021-02-13: 1 g via INTRAMUSCULAR

## 2021-02-13 MED ORDER — CEPHALEXIN 500 MG PO CAPS: 500.0000 mg | ORAL_CAPSULE | Freq: Two times a day (BID) | ORAL | 0 refills | Status: DC

## 2021-02-13 NOTE — Patient Instructions (Signed)
Follow up as needed with unresolved or worsening symptoms, Keflex sent to pharmacy, UA cultures pending and may take up to 3 days.   Urinary Tract Infection, Adult A urinary tract infection (UTI) is an infection of any part of the urinary tract. The urinary tract includes:  The kidneys.  The ureters.  The bladder.  The urethra. These organs make, store, and get rid of pee (urine) in the body. What are the causes? This infection is caused by germs (bacteria) in your genital area. These germs grow and cause swelling (inflammation) of your urinary tract. What increases the risk? The following factors may make you more likely to develop this condition:  Using a small, thin tube (catheter) to drain pee.  Not being able to control when you pee or poop (incontinence).  Being female. If you are female, these things can increase the risk: ? Using these methods to prevent pregnancy:  A medicine that kills sperm (spermicide).  A device that blocks sperm (diaphragm). ? Having low levels of a female hormone (estrogen). ? Being pregnant. You are more likely to develop this condition if:  You have genes that add to your risk.  You are sexually active.  You take antibiotic medicines.  You have trouble peeing because of: ? A prostate that is bigger than normal, if you are female. ? A blockage in the part of your body that drains pee from the bladder. ? A kidney stone. ? A nerve condition that affects your bladder. ? Not getting enough to drink. ? Not peeing often enough.  You have other conditions, such as: ? Diabetes. ? A weak disease-fighting system (immune system). ? Sickle cell disease. ? Gout. ? Injury of the spine. What are the signs or symptoms? Symptoms of this condition include:  Needing to pee right away.  Peeing small amounts often.  Pain or burning when peeing.  Blood in the pee.  Pee that smells bad or not like normal.  Trouble peeing.  Pee that is  cloudy.  Fluid coming from the vagina, if you are female.  Pain in the belly or lower back. Other symptoms include:  Vomiting.  Not feeling hungry.  Feeling mixed up (confused). This may be the first symptom in older adults.  Being tired and grouchy (irritable).  A fever.  Watery poop (diarrhea). How is this treated?  Taking antibiotic medicine.  Taking other medicines.  Drinking enough water. In some cases, you may need to see a specialist. Follow these instructions at home: Medicines  Take over-the-counter and prescription medicines only as told by your doctor.  If you were prescribed an antibiotic medicine, take it as told by your doctor. Do not stop taking it even if you start to feel better. General instructions  Make sure you: ? Pee until your bladder is empty. ? Do not hold pee for a long time. ? Empty your bladder after sex. ? Wipe from front to back after peeing or pooping if you are a female. Use each tissue one time when you wipe.  Drink enough fluid to keep your pee pale yellow.  Keep all follow-up visits.   Contact a doctor if:  You do not get better after 1-2 days.  Your symptoms go away and then come back. Get help right away if:  You have very bad back pain.  You have very bad pain in your lower belly.  You have a fever.  You have chills.  You feeling like you will vomit or  you vomit. Summary  A urinary tract infection (UTI) is an infection of any part of the urinary tract.  This condition is caused by germs in your genital area.  There are many risk factors for a UTI.  Treatment includes antibiotic medicines.  Drink enough fluid to keep your pee pale yellow. This information is not intended to replace advice given to you by your health care provider. Make sure you discuss any questions you have with your health care provider. Document Revised: 07/28/2020 Document Reviewed: 07/28/2020 Elsevier Patient Education  2021 [employer].

## 2021-02-13 NOTE — Assessment & Plan Note (Signed)
Symptoms are well controlled.  Patient is reporting new burning, frequency, strong urine odor in the last few days.  Completed urinalysis and cultures with results pending. Rocephin given in clinic.  Started patient on Keppra. Rx sent to pharmacy.  Education provided with printed handouts given, patient verbalized understanding.  Encouraged to push fluids Follow-up with worsening or unresolved symptoms.

## 2021-02-13 NOTE — Progress Notes (Signed)
Acute Office Visit  Subjective:    Patient ID: Leslie Hensley, female    DOB: [birth year], 75 y.o.   MRN: [id number]  Chief Complaint  Patient presents with  . Dysuria    Urinary Tract Infection  This is a new problem. The current episode started in the past 7 days. The problem occurs every urination. The problem has been unchanged. The quality of the pain is described as burning. The pain is moderate. There has been no fever. Associated symptoms include frequency and urgency. Pertinent negatives include no chills, discharge, flank pain, nausea or vomiting. She has tried nothing for the symptoms. The treatment provided no relief.     Past Medical History:  Diagnosis Date  . Brain aneurysm   . Hyperlipidemia   . Hypertension   . Thyroid disease     Past Surgical History:  Procedure Laterality Date  . ABDOMINAL HYSTERECTOMY    . BRAIN SURGERY     aneurysm  . FOOT SURGERY Left    spur removed  . KIDNEY STONE SURGERY      Family History  Problem Relation Age of Onset  . Heart disease Mother   . Heart disease Father   . Stroke Father   . Heart disease Sister   . Heart disease Brother   . Diabetes Brother   . Heart disease Brother   . Diabetes Brother   . Stroke Brother   . Cancer Brother     Social History   Socioeconomic History  . Marital status: Divorced    Spouse name: Not on file  . Number of children: 3  . Years of education: 10  . Highest education level: 10th grade  Occupational History  . Occupation: retired  Tobacco Use  . Smoking status: Former Smoker    Types: Cigarettes    Quit date: 12/21/1999    Years since quitting: 21.1  . Smokeless tobacco: Never Used  Vaping Use  . Vaping Use: Never used  Substance and Sexual Activity  . Alcohol use: Not Currently  . Drug use: Not Currently  . Sexual activity: Not Currently    Birth control/protection: Surgical  Other Topics Concern  . Not on file  Social History Narrative  . Not on file    Social Determinants of Health   Financial Resource Strain: Low Risk   . Difficulty of Paying Living Expenses: Not hard at all  Food Insecurity: No Food Insecurity  . Worried About Programme researcher, broadcasting/film/video in the Last Year: Never true  . Ran Out of Food in the Last Year: Never true  Transportation Needs: No Transportation Needs  . Lack of Transportation (Medical): No  . Lack of Transportation (Non-Medical): No  Physical Activity: Sufficiently Active  . Days of Exercise per Week: 7 days  . Minutes of Exercise per Session: 30 min  Stress: No Stress Concern Present  . Feeling of Stress : Not at all  Social Connections: Moderately Integrated  . Frequency of Communication with Friends and Family: More than three times a week  . Frequency of Social Gatherings with Friends and Family: More than three times a week  . Attends Religious Services: More than 4 times per year  . Active Member of Clubs or Organizations: Yes  . Attends Banker Meetings: More than 4 times per year  . Marital Status: Divorced  Catering manager Violence: Not At Risk  . Fear of Current or Ex-Partner: No  . Emotionally Abused: No  . Physically  Abused: No  . Sexually Abused: No    Good confidence in  Outpatient Medications Prior to Visit  Medication Sig Dispense Refill  . ascorbic acid (VITAMIN C) 100 MG tablet Take by mouth.    . Cholecalciferol (VITAMIN D3) 25 MCG (1000 UT) CAPS Take by mouth.    . hydrochlorothiazide (HYDRODIURIL) 25 MG tablet Take 1 tablet (25 mg total) by mouth daily. 30 tablet 11  . levothyroxine (SYNTHROID) 75 MCG tablet Take 1 tablet (75 mcg total) by mouth daily. 90 tablet 0  . mirtazapine (REMERON) 15 MG tablet Take 1 tablet (15 mg total) by mouth at bedtime. 30 tablet 1  . Omega-3 1000 MG CAPS Take by mouth.    Marland Kitchen rOPINIRole (REQUIP) 0.25 MG tablet Take 1 to 3 tablets by mouth at bedtime. 180 tablet 1  . simvastatin (ZOCOR) 20 MG tablet Take 1 tablet (20 mg total) by mouth  daily. 90 tablet 0  . Vitamin E 400 units TABS Take by mouth.     No facility-administered medications prior to visit.    Allergies  Allergen Reactions  . Codeine Hives  . Cyclobenzaprine Swelling  . Diclofenac Sodium Rash    Review of Systems  Constitutional: Negative for chills.  HENT: Negative.   Respiratory: Negative.   Gastrointestinal: Negative for nausea and vomiting.  Genitourinary: Positive for frequency and urgency. Negative for flank pain and vaginal pain.  Skin: Negative.   Neurological: Negative.   All other systems reviewed and are negative.      Objective:    Physical Exam Vitals reviewed.  Constitutional:      Appearance: Normal appearance.  HENT:     Head: Normocephalic.     Nose: Nose normal.  Eyes:     Conjunctiva/sclera: Conjunctivae normal.  Pulmonary:     Effort: Pulmonary effort is normal.  Abdominal:     General: Bowel sounds are normal.  Genitourinary:    Vagina: No vaginal discharge.     Comments: Urgency, order, burning Musculoskeletal:        General: Normal range of motion.  Skin:    General: Skin is warm.  Neurological:     Mental Status: She is alert and oriented to person, place, and time.  Psychiatric:        Behavior: Behavior normal.     BP 137/67   Pulse 74   Temp (!) 97.1 F (36.2 C)   Ht 5\' 4"  (1.626 m)   Wt 220 lb 3.2 oz (99.9 kg)   SpO2 100%   BMI 37.80 kg/m  Wt Readings from Last 3 Encounters:  02/13/21 220 lb 3.2 oz (99.9 kg)  04/19/20 211 lb (95.7 kg)  03/24/20 209 lb (94.8 kg)    Health Maintenance Due  Topic Date Due  . COLONOSCOPY (Pts 45-38yrs Insurance coverage will need to be confirmed)  Never done    There are no preventive care reminders to display for this patient.   Lab Results  Component Value Date   TSH 0.621 04/19/2020   Lab Results  Component Value Date   WBC 8.4 04/30/2019   HGB 13.5 04/30/2019   HCT 38.8 04/30/2019   MCV 90 04/30/2019   PLT 298 04/30/2019   Lab Results   Component Value Date   NA 142 04/19/2020   K 4.6 04/19/2020   CO2 26 04/19/2020   GLUCOSE 88 04/19/2020   BUN 12 04/19/2020   CREATININE 0.61 04/19/2020   BILITOT 0.4 04/19/2020   ALKPHOS 89 04/19/2020  AST 17 04/19/2020   ALT 13 04/19/2020   PROT 7.1 04/19/2020   ALBUMIN 4.0 04/19/2020   CALCIUM 9.8 04/19/2020   Lab Results  Component Value Date   CHOL 141 04/19/2020   Lab Results  Component Value Date   HDL 48 04/19/2020   Lab Results  Component Value Date   LDLCALC 71 04/19/2020   Lab Results  Component Value Date   TRIG 123 04/19/2020   Lab Results  Component Value Date   CHOLHDL 2.9 04/19/2020   No results found for: HGBA1C     Assessment & Plan:   Problem List Items Addressed This Visit      Other   Dysuria - Primary    Symptoms are well controlled.  Patient is reporting new burning, frequency, strong urine odor in the last few days.  Completed urinalysis and cultures with results pending. Rocephin given in clinic.  Started patient on Keppra. Rx sent to pharmacy.  Education provided with printed handouts given, patient verbalized understanding.  Encouraged to push fluids Follow-up with worsening or unresolved symptoms.      Relevant Medications   cephALEXin (KEFLEX) 500 MG capsule   Other Relevant Orders   Urinalysis, Complete (Completed)   Urine Culture       Meds ordered this encounter  Medications  . cephALEXin (KEFLEX) 500 MG capsule    Sig: Take 1 capsule (500 mg total) by mouth 2 (two) times daily.    Dispense:  14 capsule    Refill:  0    Order Specific Question:   Supervising Provider    Answer:   Raliegh Ip [[id number]]  . cefTRIAXone (ROCEPHIN) injection 1 g     Daryll Drown, NP

## 2021-02-15 LAB — URINE CULTURE

## 2021-02-16 ENCOUNTER — Other Ambulatory Visit (INDEPENDENT_AMBULATORY_CARE_PROVIDER_SITE_OTHER): Payer: Medicare Other | Admitting: Nurse Practitioner

## 2021-02-16 DIAGNOSIS — N3000 Acute cystitis without hematuria: Secondary | ICD-10-CM

## 2021-02-16 MED ORDER — AMOXICILLIN-POT CLAVULANATE 875-125 MG PO TABS: 1.0000 | ORAL_TABLET | Freq: Two times a day (BID) | ORAL | 0 refills | Status: DC

## 2021-03-02 ENCOUNTER — Other Ambulatory Visit: Payer: Self-pay

## 2021-03-02 ENCOUNTER — Ambulatory Visit (INDEPENDENT_AMBULATORY_CARE_PROVIDER_SITE_OTHER): Payer: Medicare Other | Admitting: Family Medicine

## 2021-03-02 ENCOUNTER — Other Ambulatory Visit: Payer: Self-pay | Admitting: *Deleted

## 2021-03-02 VITALS — BP 139/79 | HR 83 | Temp 97.5°F | Ht 64.0 in | Wt 223.0 lb

## 2021-03-02 DIAGNOSIS — I10 Essential (primary) hypertension: Secondary | ICD-10-CM

## 2021-03-02 DIAGNOSIS — E034 Atrophy of thyroid (acquired): Secondary | ICD-10-CM | POA: Diagnosis not present

## 2021-03-02 DIAGNOSIS — R911 Solitary pulmonary nodule: Secondary | ICD-10-CM

## 2021-03-02 DIAGNOSIS — Z87440 Personal history of urinary (tract) infections: Secondary | ICD-10-CM

## 2021-03-02 DIAGNOSIS — G4762 Sleep related leg cramps: Secondary | ICD-10-CM

## 2021-03-02 DIAGNOSIS — R600 Localized edema: Secondary | ICD-10-CM

## 2021-03-02 DIAGNOSIS — F5101 Primary insomnia: Secondary | ICD-10-CM | POA: Diagnosis not present

## 2021-03-02 DIAGNOSIS — IMO0001 Reserved for inherently not codable concepts without codable children: Secondary | ICD-10-CM

## 2021-03-02 DIAGNOSIS — F418 Other specified anxiety disorders: Secondary | ICD-10-CM

## 2021-03-02 LAB — URINALYSIS, COMPLETE
Bilirubin, UA: NEGATIVE
Glucose, UA: NEGATIVE
Ketones, UA: NEGATIVE
Leukocytes,UA: NEGATIVE
Nitrite, UA: NEGATIVE
Protein,UA: NEGATIVE
RBC, UA: NEGATIVE
Specific Gravity, UA: 1.025 (ref 1.005–1.030)
Urobilinogen, Ur: 0.2 mg/dL (ref 0.2–1.0)
pH, UA: 7 (ref 5.0–7.5)

## 2021-03-02 LAB — MICROSCOPIC EXAMINATION
Bacteria, UA: NONE SEEN
Epithelial Cells (non renal): NONE SEEN /hpf (ref 0–10)
RBC, Urine: NONE SEEN /hpf (ref 0–2)
WBC, UA: NONE SEEN /hpf (ref 0–5)

## 2021-03-02 MED ORDER — ROPINIROLE HCL 0.25 MG PO TABS: ORAL_TABLET | ORAL | 1 refills | Status: DC

## 2021-03-02 MED ORDER — MIRTAZAPINE 15 MG PO TABS: 15.0000 mg | ORAL_TABLET | Freq: Every day | ORAL | 1 refills | Status: DC

## 2021-03-02 MED ORDER — MIRTAZAPINE 15 MG PO TABS: 15.0000 mg | ORAL_TABLET | Freq: Every day | ORAL | 12 refills | Status: DC

## 2021-03-02 MED ORDER — HYDROCHLOROTHIAZIDE 25 MG PO TABS: 25.0000 mg | ORAL_TABLET | Freq: Every day | ORAL | 11 refills | Status: DC

## 2021-03-02 MED ORDER — SIMVASTATIN 20 MG PO TABS: 20.0000 mg | ORAL_TABLET | Freq: Every day | ORAL | 3 refills | Status: DC

## 2021-03-02 NOTE — Progress Notes (Signed)
Subjective: CC: Follow-up insomnia, restless leg, urinary tract infection PCP: Janora Norlander, DO OBS:JGGEZ E Pak is a 75 y.o. female presenting to clinic today for:  1.  Recent urinary tract infection Patient is status post treatment for urinary tract infection.  She continues to have intermittent odor but this is not present today.  Just wanted to have a urine sample rechecked but denies any dysuria, hematuria or change in urinary frequency  2.  Anxiety disorder/insomnia Patient reports control of symptoms with use of the mirtazapine.  She is sleeping well.  She admits to some weight gain but admits that she is also not exercising regularly nor is she following a strict diet.  She admits that she is had some grieving since the loss of her son and this likely contributes to emotional eating.  3.  Hypothyroidism Patient compliant with Synthroid 75 mcg daily.  No tremor, heart palpitations, change in bowel habits.  She is had difficulty with weight loss as above  4.  Pulmonary nodules Patient noted to have pulmonary nodules in the right upper and lower lobes in 2017.  She had a repeat CT chest in 2021 which showed stability.  She wanted to have 1 more check on these.  Not smoking.  No known risk factors currently.  No hemoptysis, change in breathing.   ROS: Per HPI  Allergies  Allergen Reactions  . Codeine Hives  . Cyclobenzaprine Swelling  . Diclofenac Sodium Rash   Past Medical History:  Diagnosis Date  . Brain aneurysm   . Hyperlipidemia   . Hypertension   . Thyroid disease     Current Outpatient Medications:  .  ascorbic acid (VITAMIN C) 100 MG tablet, Take by mouth., Disp: , Rfl:  .  Cholecalciferol (VITAMIN D3) 25 MCG (1000 UT) CAPS, Take by mouth., Disp: , Rfl:  .  hydrochlorothiazide (HYDRODIURIL) 25 MG tablet, Take 1 tablet (25 mg total) by mouth daily., Disp: 30 tablet, Rfl: 11 .  levothyroxine (SYNTHROID) 75 MCG tablet, Take 1 tablet (75 mcg total) by  mouth daily., Disp: 90 tablet, Rfl: 0 .  mirtazapine (REMERON) 15 MG tablet, Take 1 tablet (15 mg total) by mouth at bedtime., Disp: 30 tablet, Rfl: 1 .  Omega-3 1000 MG CAPS, Take by mouth., Disp: , Rfl:  .  rOPINIRole (REQUIP) 0.25 MG tablet, Take 1 to 3 tablets by mouth at bedtime., Disp: 180 tablet, Rfl: 1 .  simvastatin (ZOCOR) 20 MG tablet, Take 1 tablet (20 mg total) by mouth daily., Disp: 90 tablet, Rfl: 0 .  Vitamin E 400 units TABS, Take by mouth., Disp: , Rfl:  Social History   Socioeconomic History  . Marital status: Divorced    Spouse name: Not on file  . Number of children: 3  . Years of education: 10  . Highest education level: 10th grade  Occupational History  . Occupation: retired  Tobacco Use  . Smoking status: Former Smoker    Types: Cigarettes    Quit date: 12/21/1999    Years since quitting: 21.2  . Smokeless tobacco: Never Used  Vaping Use  . Vaping Use: Never used  Substance and Sexual Activity  . Alcohol use: Not Currently  . Drug use: Not Currently  . Sexual activity: Not Currently    Birth control/protection: Surgical  Other Topics Concern  . Not on file  Social History Narrative  . Not on file   Social Determinants of Health   Financial Resource Strain: Low Risk   .  Difficulty of Paying Living Expenses: Not hard at all  Food Insecurity: No Food Insecurity  . Worried About Charity fundraiser in the Last Year: Never true  . Ran Out of Food in the Last Year: Never true  Transportation Needs: No Transportation Needs  . Lack of Transportation (Medical): No  . Lack of Transportation (Non-Medical): No  Physical Activity: Sufficiently Active  . Days of Exercise per Week: 7 days  . Minutes of Exercise per Session: 30 min  Stress: No Stress Concern Present  . Feeling of Stress : Not at all  Social Connections: Moderately Integrated  . Frequency of Communication with Friends and Family: More than three times a week  . Frequency of Social Gatherings  with Friends and Family: More than three times a week  . Attends Religious Services: More than 4 times per year  . Active Member of Clubs or Organizations: Yes  . Attends Archivist Meetings: More than 4 times per year  . Marital Status: Divorced  Human resources officer Violence: Not At Risk  . Fear of Current or Ex-Partner: No  . Emotionally Abused: No  . Physically Abused: No  . Sexually Abused: No   Family History  Problem Relation Age of Onset  . Heart disease Mother   . Heart disease Father   . Stroke Father   . Heart disease Sister   . Heart disease Brother   . Diabetes Brother   . Heart disease Brother   . Diabetes Brother   . Stroke Brother   . Cancer Brother     Objective: Office vital signs reviewed. BP 139/79   Pulse 83   Temp (!) 97.5 F (36.4 C) (Temporal)   Ht 5' 4" (1.626 m)   Wt 223 lb (101.2 kg)   SpO2 93%   BMI 38.28 kg/m   Physical Examination:  General: Awake, alert, well nourished, No acute distress HEENT: Normal; sclera white.  Moist mucous membranes Cardio: regular rate and rhythm, S1S2 heard, no murmurs appreciated Pulm: clear to auscultation bilaterally, no wheezes, rhonchi or rales; normal work of breathing on room air Extremities: warm, well perfused, No edema, cyanosis or clubbing; +2 pulses bilaterally MSK: normal gait and station Skin: dry; intact; no rashes or lesions; normal temperature Neuro: No tremor  Assessment/ Plan: 75 y.o. female   Hypothyroidism due to acquired atrophy of thyroid - Plan: Thyroid Panel With TSH  Essential hypertension - Plan: CMP14+EGFR  Primary insomnia - Plan: mirtazapine (REMERON) 15 MG tablet  Nocturnal leg cramps - Plan: rOPINIRole (REQUIP) 0.25 MG tablet, CMP14+EGFR  Recent urinary tract infection - Plan: Urinalysis, Complete  Localized edema - Plan: hydrochlorothiazide (HYDRODIURIL) 25 MG tablet  Situational anxiety - Plan: mirtazapine (REMERON) 15 MG tablet  Lung nodule < 6cm on CT -  Plan: CT CHEST NODULE FOLLOW UP LOW DOSE W/O  Asymptomatic with exception of difficulty with weight loss from a thyroid standpoint  Blood pressure controlled upon recheck  Insomnia, leg cramps and situational anxiety are stable.  Urinalysis without evidence of ongoing infection  Edema stable.  Hydrochlorothiazide.  Plan for repeat CT scan for lung nodule follow-up.  Following that, likely no need for ongoing surveillance given stability  No orders of the defined types were placed in this encounter.  No orders of the defined types were placed in this encounter.    Janora Norlander, DO [employer] [phone]

## 2021-03-02 NOTE — Patient Instructions (Signed)

## 2021-03-28 DIAGNOSIS — M25572 Pain in left ankle and joints of left foot: Secondary | ICD-10-CM | POA: Diagnosis not present

## 2021-03-28 DIAGNOSIS — R911 Solitary pulmonary nodule: Secondary | ICD-10-CM | POA: Diagnosis not present

## 2021-03-28 DIAGNOSIS — M79672 Pain in left foot: Secondary | ICD-10-CM | POA: Diagnosis not present

## 2021-03-28 DIAGNOSIS — M7662 Achilles tendinitis, left leg: Secondary | ICD-10-CM | POA: Diagnosis not present

## 2021-03-30 ENCOUNTER — Ambulatory Visit (HOSPITAL_COMMUNITY): Payer: Medicare Other

## 2021-04-29 ENCOUNTER — Other Ambulatory Visit: Payer: Self-pay | Admitting: Family Medicine

## 2021-05-25 DIAGNOSIS — M7662 Achilles tendinitis, left leg: Secondary | ICD-10-CM | POA: Diagnosis not present

## 2021-05-25 DIAGNOSIS — M21862 Other specified acquired deformities of left lower leg: Secondary | ICD-10-CM | POA: Diagnosis not present

## 2021-05-28 ENCOUNTER — Other Ambulatory Visit: Payer: Self-pay | Admitting: Family Medicine

## 2021-05-30 ENCOUNTER — Other Ambulatory Visit: Payer: Self-pay

## 2021-05-30 ENCOUNTER — Other Ambulatory Visit: Payer: Medicare Other

## 2021-05-30 ENCOUNTER — Telehealth: Payer: Self-pay | Admitting: Family Medicine

## 2021-05-30 DIAGNOSIS — M67972 Unspecified disorder of synovium and tendon, left ankle and foot: Secondary | ICD-10-CM | POA: Diagnosis not present

## 2021-05-30 DIAGNOSIS — E034 Atrophy of thyroid (acquired): Secondary | ICD-10-CM | POA: Diagnosis not present

## 2021-05-30 DIAGNOSIS — G4762 Sleep related leg cramps: Secondary | ICD-10-CM | POA: Diagnosis not present

## 2021-05-30 DIAGNOSIS — I10 Essential (primary) hypertension: Secondary | ICD-10-CM | POA: Diagnosis not present

## 2021-05-30 DIAGNOSIS — M19072 Primary osteoarthritis, left ankle and foot: Secondary | ICD-10-CM | POA: Diagnosis not present

## 2021-05-30 DIAGNOSIS — S86312A Strain of muscle(s) and tendon(s) of peroneal muscle group at lower leg level, left leg, initial encounter: Secondary | ICD-10-CM | POA: Diagnosis not present

## 2021-05-30 DIAGNOSIS — M7732 Calcaneal spur, left foot: Secondary | ICD-10-CM | POA: Diagnosis not present

## 2021-05-30 MED ORDER — LEVOTHYROXINE SODIUM 75 MCG PO TABS: 75.0000 ug | ORAL_TABLET | Freq: Every day | ORAL | 0 refills | Status: DC

## 2021-05-30 NOTE — Telephone Encounter (Signed)
Pt aware all meds updated at 03/02/21 visit. 30 day refill sent today on thyroid med. Needs to get labwork drawn to make sure she is still on same dose

## 2021-05-30 NOTE — Telephone Encounter (Signed)
  Prescription Request  05/30/2021  What is the name of the medication or equipment? Levothyroxine 75 MCG If she has anymore that needs to be updated to [facility] please send  Have you contacted your pharmacy to request a refill? (if applicable) YES  Which pharmacy would you like this sent to? [facility]   Patient notified that their request is being sent to the clinical staff for review and that they should receive a response within 2 business days.

## 2021-05-31 LAB — CMP14+EGFR
ALT: 11 IU/L (ref 0–32)
AST: 15 IU/L (ref 0–40)
Albumin/Globulin Ratio: 1.5 (ref 1.2–2.2)
Albumin: 4.5 g/dL (ref 3.7–4.7)
Alkaline Phosphatase: 94 IU/L (ref 44–121)
BUN/Creatinine Ratio: 14 (ref 12–28)
BUN: 10 mg/dL (ref 8–27)
Bilirubin Total: 0.6 mg/dL (ref 0.0–1.2)
CO2: 23 mmol/L (ref 20–29)
Calcium: 10.1 mg/dL (ref 8.7–10.3)
Chloride: 102 mmol/L (ref 96–106)
Creatinine, Ser: 0.71 mg/dL (ref 0.57–1.00)
Globulin, Total: 3.1 g/dL (ref 1.5–4.5)
Glucose: 96 mg/dL (ref 65–99)
Potassium: 4.2 mmol/L (ref 3.5–5.2)
Sodium: 141 mmol/L (ref 134–144)
Total Protein: 7.6 g/dL (ref 6.0–8.5)
eGFR: 90 mL/min/{1.73_m2} (ref >59)

## 2021-05-31 LAB — THYROID PANEL WITH TSH
Free Thyroxine Index: 2.8 (ref 1.2–4.9)
T3 Uptake Ratio: 25 % (ref 24–39)
T4, Total: 11 ug/dL (ref 4.5–12.0)
TSH: 0.786 u[IU]/mL (ref 0.450–4.500)

## 2021-06-11 DIAGNOSIS — M21862 Other specified acquired deformities of left lower leg: Secondary | ICD-10-CM | POA: Insufficient documentation

## 2021-06-11 DIAGNOSIS — M7662 Achilles tendinitis, left leg: Secondary | ICD-10-CM | POA: Diagnosis not present

## 2021-06-19 DIAGNOSIS — M7662 Achilles tendinitis, left leg: Secondary | ICD-10-CM | POA: Diagnosis not present

## 2021-06-19 DIAGNOSIS — M21862 Other specified acquired deformities of left lower leg: Secondary | ICD-10-CM | POA: Diagnosis not present

## 2021-06-26 ENCOUNTER — Other Ambulatory Visit: Payer: Self-pay | Admitting: Family Medicine

## 2021-07-01 IMAGING — DX RIGHT FOOT COMPLETE - 3+ VIEW
3 series · 3 of 3 positions shown · non-contrast
Comparison: None.

CLINICAL DATA: Fifth toe injury.

EXAM:
RIGHT FOOT COMPLETE - 3+ VIEW

[foot ap]
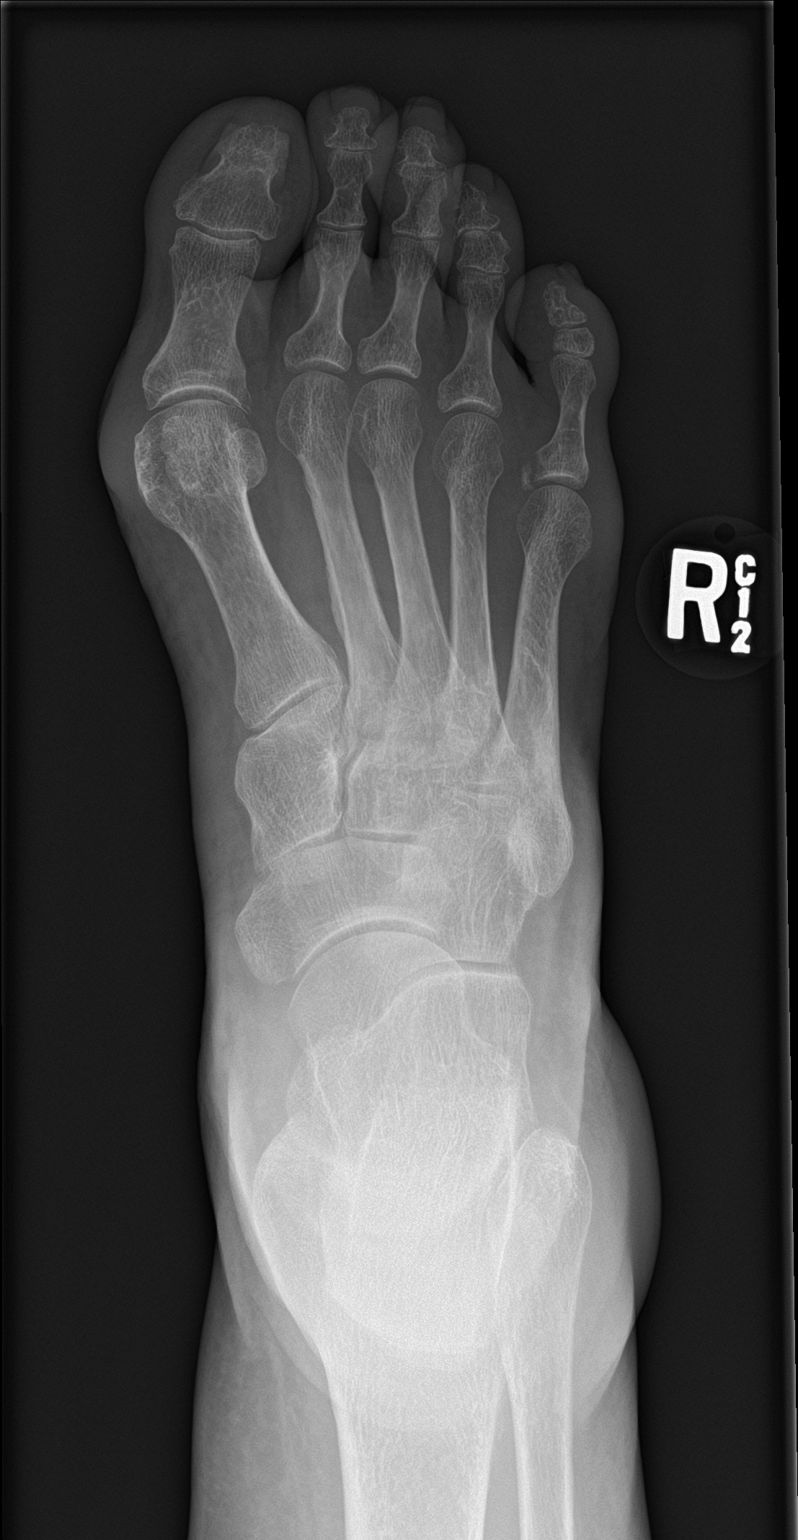

[foot obl]
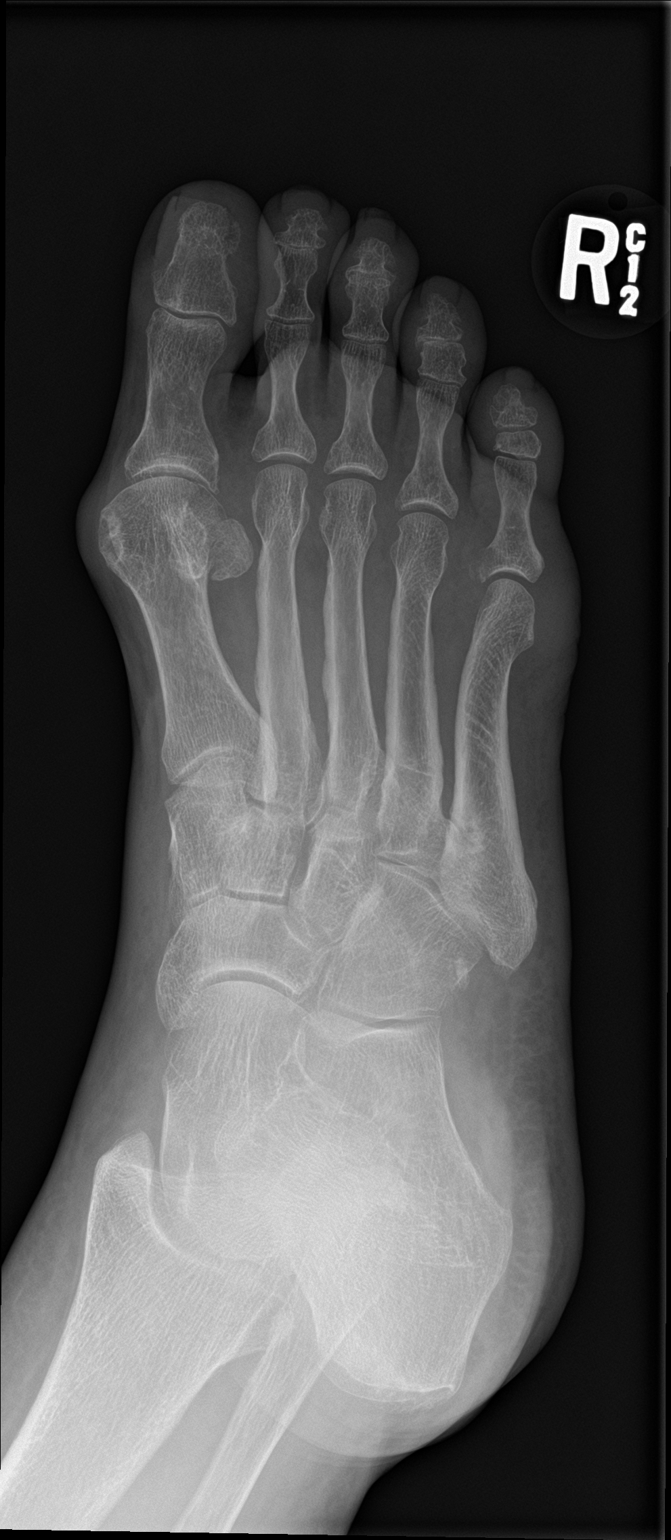

[foot lat]
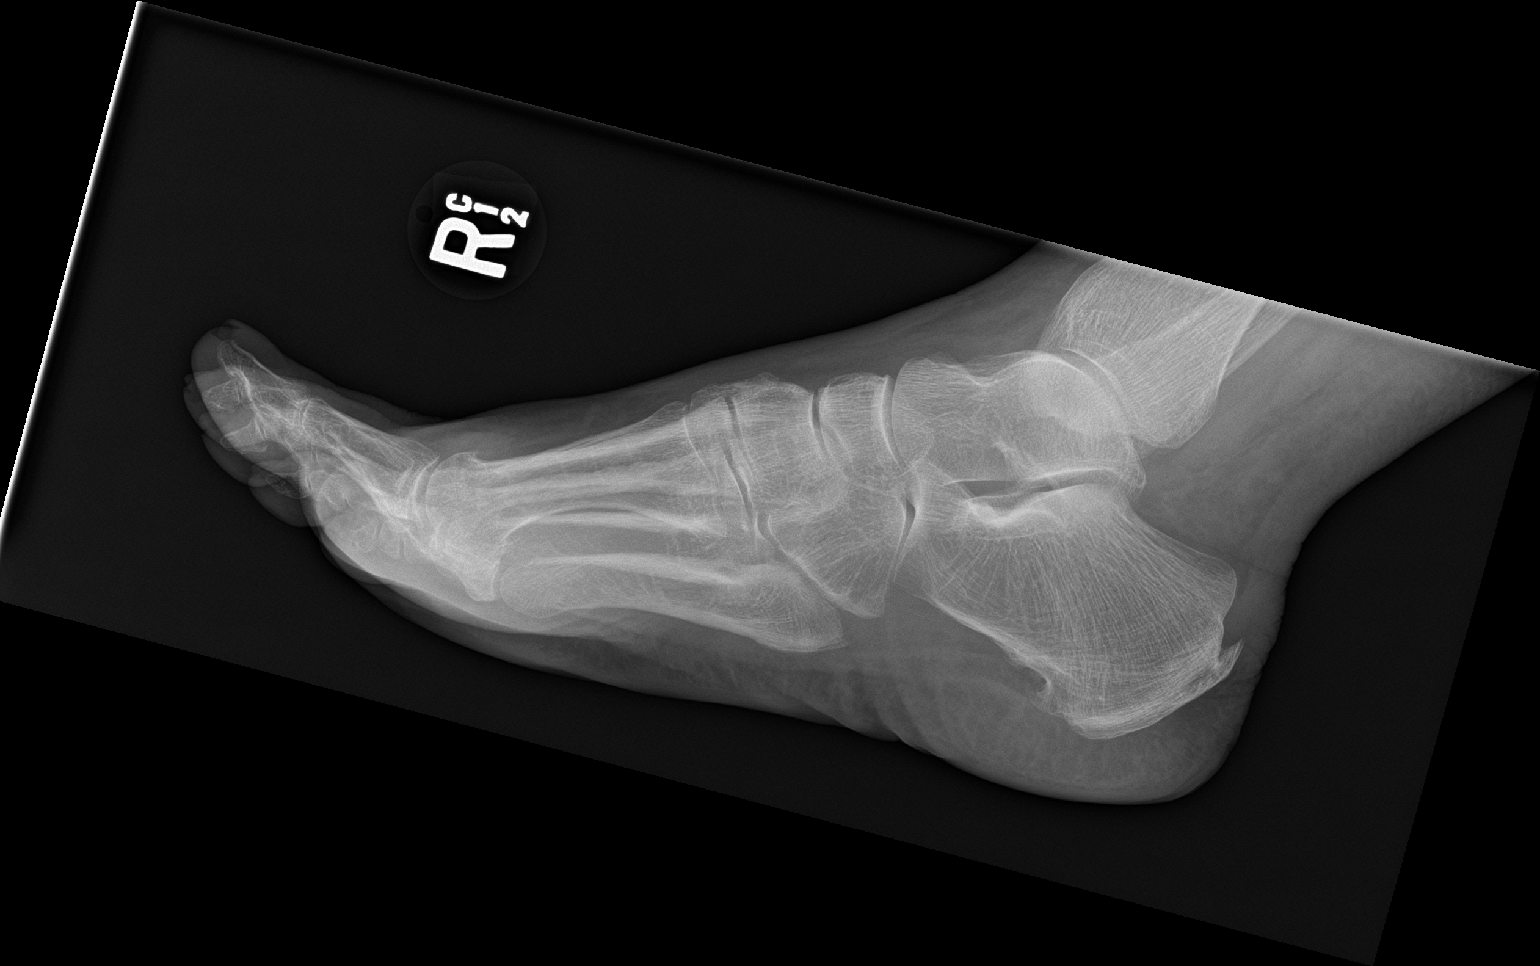

[3 of 3 positions shown; findings below may reference images not displayed]

FINDINGS: There is an acute intra-articular mildly displaced fracture
involving the base of the proximal phalanx of the fifth digit. There
are degenerative changes of the interphalangeal joints. There is no
dislocation. There is a small plantar calcaneal spur. There is an
Achilles tendon enthesophyte.
IMPRESSION: Acute mildly displaced intra-articular fracture involving the
proximal phalanx of the fifth digit.

## 2021-07-25 ENCOUNTER — Ambulatory Visit (INDEPENDENT_AMBULATORY_CARE_PROVIDER_SITE_OTHER): Payer: Medicare Other | Admitting: Family Medicine

## 2021-07-25 ENCOUNTER — Ambulatory Visit: Payer: Medicare Other | Admitting: Nurse Practitioner

## 2021-07-25 DIAGNOSIS — R0981 Nasal congestion: Secondary | ICD-10-CM

## 2021-07-25 DIAGNOSIS — J040 Acute laryngitis: Secondary | ICD-10-CM

## 2021-07-25 MED ORDER — LEVOCETIRIZINE DIHYDROCHLORIDE 5 MG PO TABS: 2.5000 mg | ORAL_TABLET | Freq: Every evening | ORAL | 0 refills | Status: DC | PRN

## 2021-07-25 MED ORDER — MOMETASONE FUROATE 50 MCG/ACT NA SUSP: 2.0000 | Freq: Every day | NASAL | 12 refills | Status: DC

## 2021-07-25 NOTE — Progress Notes (Signed)
Telephone visit  Subjective: CC: Cold PCP: Raliegh Ip, DO WEX:HBZJI [name] is a 75 y.o. female calls for telephone consult today. Patient provides verbal consent for consult held via phone.  Due to COVID-19 pandemic this visit was conducted virtually. This visit type was conducted due to national recommendations for restrictions regarding the COVID-19 Pandemic (e.g. social distancing, sheltering in place) in an effort to limit this patient's exposure and mitigate transmission in our community. All issues noted in this document were discussed and addressed.  A physical exam was not performed with this format.   Location of patient: home Location of provider: [facility] Others present for call: none  1. Cold Patient reports that she started with cold symptoms overnight.  She has had laryngitis for the last couple of days and has some green nasal discharge.  She reports nasal stuffiness.  No fevers, body aches, nausea, vomiting, diarrhea, cough or shortness of breath.  She is not currently treated with any allergy medicines.  She is not taking anything for the after mentioned symptoms yet.   ROS: Per HPI  Allergies  Allergen Reactions   Codeine Hives   Cyclobenzaprine Swelling   Diclofenac Sodium Rash   Past Medical History:  Diagnosis Date   Brain aneurysm    Hyperlipidemia    Hypertension    Thyroid disease     Current Outpatient Medications:    ascorbic acid (VITAMIN C) 100 MG tablet, Take by mouth., Disp: , Rfl:    Cholecalciferol (VITAMIN D3) 25 MCG (1000 UT) CAPS, Take by mouth., Disp: , Rfl:    hydrochlorothiazide (HYDRODIURIL) 25 MG tablet, Take 1 tablet (25 mg total) by mouth daily., Disp: 30 tablet, Rfl: 11   levothyroxine (SYNTHROID) 75 MCG tablet, Take 1 tablet (75 mcg total) by mouth daily., Disp: 90 tablet, Rfl: 3   mirtazapine (REMERON) 15 MG tablet, Take 1 tablet (15 mg total) by mouth at bedtime., Disp: 30 tablet, Rfl: 12   Omega-3 1000 MG CAPS, Take by  mouth., Disp: , Rfl:    rOPINIRole (REQUIP) 0.25 MG tablet, Take 1 to 3 tablets by mouth at bedtime., Disp: 180 tablet, Rfl: 1   simvastatin (ZOCOR) 20 MG tablet, Take 1 tablet (20 mg total) by mouth daily., Disp: 90 tablet, Rfl: 3   Vitamin E 400 units TABS, Take by mouth., Disp: , Rfl:   Assessment/ Plan: 75 y.o. female   Acute viral laryngitis - Plan: levocetirizine (XYZAL) 5 MG tablet  Nasal congestion - Plan: mometasone (NASONEX) 50 MCG/ACT nasal spray  Likely a viral cold.  Start Xyzal at 2.5 mg at bedtime.  This should help with the postnasal drip and drainage.  Nasonex for nasal congestion.  We discussed signs and symptoms of bacterial infection and or infection concerning for COVID-19.  Offered screening today for COVID-19 but she would like to hold off on this.  Plan for antibiotics if worsening of symptoms.  She will contact me on Friday for updates.  Start time: 12:06pm End time: 12:11pm  Total time spent on patient care (including telephone call/ virtual visit): 5 minutes  Leslie Hensley Hulen Skains, DO [facility] [phone]

## 2021-07-27 ENCOUNTER — Telehealth: Payer: Self-pay | Admitting: Family Medicine

## 2021-07-27 ENCOUNTER — Other Ambulatory Visit: Payer: Self-pay | Admitting: Family Medicine

## 2021-07-27 DIAGNOSIS — J019 Acute sinusitis, unspecified: Secondary | ICD-10-CM

## 2021-07-27 DIAGNOSIS — B9689 Other specified bacterial agents as the cause of diseases classified elsewhere: Secondary | ICD-10-CM

## 2021-07-27 MED ORDER — AMOXICILLIN-POT CLAVULANATE 875-125 MG PO TABS: 1.0000 | ORAL_TABLET | Freq: Two times a day (BID) | ORAL | 0 refills | Status: DC

## 2021-07-27 NOTE — Telephone Encounter (Signed)
Pt aware of results and that [facility] recommends covid test

## 2021-07-27 NOTE — Telephone Encounter (Signed)
Augmentin sent. Recommend COVID testing as well.  Order has bene placed.

## 2021-07-30 ENCOUNTER — Other Ambulatory Visit: Payer: Self-pay | Admitting: Family Medicine

## 2021-07-30 DIAGNOSIS — G4762 Sleep related leg cramps: Secondary | ICD-10-CM

## 2021-08-27 ENCOUNTER — Other Ambulatory Visit: Payer: Self-pay | Admitting: Family Medicine

## 2021-08-27 DIAGNOSIS — G4762 Sleep related leg cramps: Secondary | ICD-10-CM

## 2021-09-06 ENCOUNTER — Telehealth: Payer: Self-pay | Admitting: Family Medicine

## 2021-09-06 NOTE — Telephone Encounter (Signed)
Form on providers desk for signature 

## 2021-09-06 NOTE — Telephone Encounter (Signed)
Leslie Ang do you know anything about form?

## 2021-09-17 ENCOUNTER — Ambulatory Visit (INDEPENDENT_AMBULATORY_CARE_PROVIDER_SITE_OTHER): Payer: Medicare Other | Admitting: Family Medicine

## 2021-09-17 DIAGNOSIS — Z862 Personal history of diseases of the blood and blood-forming organs and certain disorders involving the immune mechanism: Secondary | ICD-10-CM | POA: Diagnosis not present

## 2021-09-17 DIAGNOSIS — Z872 Personal history of diseases of the skin and subcutaneous tissue: Secondary | ICD-10-CM

## 2021-09-17 NOTE — Progress Notes (Signed)
Telephone visit  Subjective: CC: Tinted window application form PCP: Raliegh Ip, DO IRW:ERXVQ E Pinkard is a 75 y.o. female calls for telephone consult today. Patient provides verbal consent for consult held via phone.  Due to COVID-19 pandemic this visit was conducted virtually. This visit type was conducted due to national recommendations for restrictions regarding the COVID-19 Pandemic (e.g. social distancing, sheltering in place) in an effort to limit this patient's exposure and mitigate transmission in our community. All issues noted in this document were discussed and addressed.  A physical exam was not performed with this format.   Location of patient: home Location of provider: [facility] Others present for call: none  1.  Discoid lupus erythematosus Patient with history of discoid lupus erythematosus.  She really tries to be careful in avoiding light because this increases the frequency and severity of her flareups.  She is requesting renewal of her tinted window waiver application because this allows her to drive without exacerbating her underlying skin condition.  Denies any visual issues.   ROS: Per HPI  Allergies  Allergen Reactions   Codeine Hives   Cyclobenzaprine Swelling   Diclofenac Sodium Rash   Past Medical History:  Diagnosis Date   Brain aneurysm    Hyperlipidemia    Hypertension    Thyroid disease     Current Outpatient Medications:    amoxicillin-clavulanate (AUGMENTIN) 875-125 MG tablet, Take 1 tablet by mouth 2 (two) times daily., Disp: 20 tablet, Rfl: 0   ascorbic acid (VITAMIN C) 100 MG tablet, Take by mouth., Disp: , Rfl:    Cholecalciferol (VITAMIN D3) 25 MCG (1000 UT) CAPS, Take by mouth., Disp: , Rfl:    hydrochlorothiazide (HYDRODIURIL) 25 MG tablet, Take 1 tablet (25 mg total) by mouth daily., Disp: 30 tablet, Rfl: 11   levocetirizine (XYZAL) 5 MG tablet, Take 0.5-1 tablets (2.5-5 mg total) by mouth at bedtime as needed for allergies  (drainage)., Disp: 45 tablet, Rfl: 0   levothyroxine (SYNTHROID) 75 MCG tablet, Take 1 tablet (75 mcg total) by mouth daily., Disp: 90 tablet, Rfl: 3   mirtazapine (REMERON) 15 MG tablet, Take 1 tablet (15 mg total) by mouth at bedtime., Disp: 30 tablet, Rfl: 12   mometasone (NASONEX) 50 MCG/ACT nasal spray, Place 2 sprays into the nose daily., Disp: 1 each, Rfl: 12   Omega-3 1000 MG CAPS, Take by mouth., Disp: , Rfl:    rOPINIRole (REQUIP) 0.25 MG tablet, Take 1 to 3 tablets by mouth at bedtime. (NEEDS TO BE SEEN BEFORE NEXT REFILL), Disp: 90 tablet, Rfl: 0   simvastatin (ZOCOR) 20 MG tablet, Take 1 tablet (20 mg total) by mouth daily., Disp: 90 tablet, Rfl: 3   Vitamin E 400 units TABS, Take by mouth., Disp: , Rfl:        Assessment/ Plan: 75 y.o. female   H/O discoid lupus erythematosus  I believe her condition to be appropriate for this application.  I have completed her tinted window waiver application form.  Copy in this note.  Will mail to requested facility.  May follow-up as needed  Start time: 2:37pm End time: 2:42pm  Total time spent on patient care (including telephone call/ virtual visit): 5 minutes  Julee Stoll Hulen Skains, DO [facility] [phone]

## 2021-09-19 NOTE — Telephone Encounter (Signed)
Mailed after pt's visit on 09/17/21

## 2021-10-01 ENCOUNTER — Other Ambulatory Visit: Payer: Self-pay | Admitting: Family Medicine

## 2021-10-01 DIAGNOSIS — G4762 Sleep related leg cramps: Secondary | ICD-10-CM

## 2021-10-01 NOTE — Telephone Encounter (Signed)
[facility]. NTBS 30 days given 08/27/21

## 2021-10-02 ENCOUNTER — Telehealth: Payer: Self-pay | Admitting: Family Medicine

## 2021-10-02 DIAGNOSIS — G4762 Sleep related leg cramps: Secondary | ICD-10-CM

## 2021-10-02 MED ORDER — ROPINIROLE HCL 0.25 MG PO TABS: ORAL_TABLET | ORAL | 1 refills | Status: DC

## 2021-10-02 NOTE — Telephone Encounter (Signed)
Pt wanted refill sent to [facility], this was corrected & cancelled at [facility]

## 2021-10-02 NOTE — Telephone Encounter (Signed)
  Prescription Request  10/02/2021  Is this a "Controlled Substance" medicine? no Have you seen your PCP in the last 2 weeks? Made first available appt in December If YES, route message to pool  -  If NO, patient needs to be scheduled for appointment.  What is the name of the medication or equipment?Ropinirole  Have you contacted your pharmacy to request a refill?   [facility]   Patient notified that their request is being sent to the clinical staff for review and that they should receive a response within 2 business days.

## 2021-10-17 DIAGNOSIS — M7662 Achilles tendinitis, left leg: Secondary | ICD-10-CM | POA: Diagnosis not present

## 2021-10-17 DIAGNOSIS — R600 Localized edema: Secondary | ICD-10-CM | POA: Diagnosis not present

## 2021-10-31 ENCOUNTER — Encounter: Payer: Self-pay | Admitting: Family Medicine

## 2021-10-31 ENCOUNTER — Ambulatory Visit (INDEPENDENT_AMBULATORY_CARE_PROVIDER_SITE_OTHER): Payer: Medicare Other | Admitting: Family Medicine

## 2021-10-31 ENCOUNTER — Other Ambulatory Visit: Payer: Self-pay

## 2021-10-31 VITALS — BP 142/73 | HR 86 | Temp 97.9°F | Ht 64.0 in | Wt 219.0 lb

## 2021-10-31 DIAGNOSIS — L989 Disorder of the skin and subcutaneous tissue, unspecified: Secondary | ICD-10-CM

## 2021-10-31 NOTE — Patient Instructions (Signed)
Leticia Clas PA [address] [place]  [phone]

## 2021-10-31 NOTE — Progress Notes (Signed)
Subjective: CC: Skin lesion PCP: Raliegh Ip, DO TKZ:SWFUX E Leslie Hensley is a 75 y.o. female presenting to clinic today for:  1.  Skin lesion Patient reports a new skin lesion on the right cheek that appeared about a month ago.  She does not report any significant itching or pain.  No enlargement or significant change but this is a lesion that is brand-new to her.  She does have a history of discoid lupus but this does not seem consistent with her typical.  She has a personal history of cancer and worries that this may be a new skin cancer.  No known family history or personal history of skin cancers however including melanoma   ROS: Per HPI  Allergies  Allergen Reactions   Codeine Hives   Cyclobenzaprine Swelling   Diclofenac Sodium Rash   Past Medical History:  Diagnosis Date   Brain aneurysm    Hyperlipidemia    Hypertension    Thyroid disease     Current Outpatient Medications:    amoxicillin-clavulanate (AUGMENTIN) 875-125 MG tablet, Take 1 tablet by mouth 2 (two) times daily., Disp: 20 tablet, Rfl: 0   ascorbic acid (VITAMIN C) 100 MG tablet, Take by mouth., Disp: , Rfl:    Cholecalciferol (VITAMIN D3) 25 MCG (1000 UT) CAPS, Take by mouth., Disp: , Rfl:    hydrochlorothiazide (HYDRODIURIL) 25 MG tablet, Take 1 tablet (25 mg total) by mouth daily., Disp: 30 tablet, Rfl: 11   levocetirizine (XYZAL) 5 MG tablet, Take 0.5-1 tablets (2.5-5 mg total) by mouth at bedtime as needed for allergies (drainage)., Disp: 45 tablet, Rfl: 0   levothyroxine (SYNTHROID) 75 MCG tablet, Take 1 tablet (75 mcg total) by mouth daily., Disp: 90 tablet, Rfl: 3   mirtazapine (REMERON) 15 MG tablet, Take 1 tablet (15 mg total) by mouth at bedtime., Disp: 30 tablet, Rfl: 12   mometasone (NASONEX) 50 MCG/ACT nasal spray, Place 2 sprays into the nose daily., Disp: 1 each, Rfl: 12   Omega-3 1000 MG CAPS, Take by mouth., Disp: , Rfl:    rOPINIRole (REQUIP) 0.25 MG tablet, Take 1 to 3 tablets by mouth  at bedtime., Disp: 90 tablet, Rfl: 1   simvastatin (ZOCOR) 20 MG tablet, Take 1 tablet (20 mg total) by mouth daily., Disp: 90 tablet, Rfl: 3   Vitamin E 400 units TABS, Take by mouth., Disp: , Rfl:  Social History   Socioeconomic History   Marital status: Divorced    Spouse name: Not on file   Number of children: 3   Years of education: 10   Highest education level: 10th grade  Occupational History   Occupation: retired  Tobacco Use   Smoking status: Former    Types: Cigarettes    Quit date: 12/21/1999    Years since quitting: 21.8   Smokeless tobacco: Never  Vaping Use   Vaping Use: Never used  Substance and Sexual Activity   Alcohol use: Not Currently   Drug use: Not Currently   Sexual activity: Not Currently    Birth control/protection: Surgical  Other Topics Concern   Not on file  Social History Narrative   Not on file   Social Determinants of Health   Financial Resource Strain: Low Risk    Difficulty of Paying Living Expenses: Not hard at all  Food Insecurity: No Food Insecurity   Worried About Programme researcher, broadcasting/film/video in the Last Year: Never true   Ran Out of Food in the Last Year: Never true  Transportation Needs: No Regulatory affairs officer (Medical): No   Lack of Transportation (Non-Medical): No  Physical Activity: Sufficiently Active   Days of Exercise per Week: 7 days   Minutes of Exercise per Session: 30 min  Stress: No Stress Concern Present   Feeling of Stress : Not at all  Social Connections: Moderately Integrated   Frequency of Communication with Friends and Family: More than three times a week   Frequency of Social Gatherings with Friends and Family: More than three times a week   Attends Religious Services: More than 4 times per year   Active Member of [employer] or Organizations: Yes   Attends Engineer, structural: More than 4 times per year   Marital Status: Divorced  Catering manager Violence: Not At Risk   Fear of  Current or Ex-Partner: No   Emotionally Abused: No   Physically Abused: No   Sexually Abused: No   Family History  Problem Relation Age of Onset   Heart disease Mother    Heart disease Father    Stroke Father    Heart disease Sister    Heart disease Brother    Diabetes Brother    Heart disease Brother    Diabetes Brother    Stroke Brother    Cancer Brother     Objective: Office vital signs reviewed. BP (!) 142/73   Pulse 86   Temp 97.9 F (36.6 C)   Ht 5\' 4"  (1.626 m)   Wt 219 lb (99.3 kg)   SpO2 93%   BMI 37.59 kg/m   Physical Examination:  General: Awake, alert, well nourished, No acute distress Skin: Right cheek with semicircular hyperemic/hypervascular lesion that is flat 0.75cmx0.5cm.  No appreciable central umbilication or rolled borders  Assessment/ Plan: 75 y.o. female   Skin lesion of face - Plan: Ambulatory referral to Dermatology  Uncertain as to what this lesion represents.  I considered possible early basal cell but does not have the typical rolled borders or central umbilication.  I could appreciate slight hypervascularity in the inferior aspect of this lesion however.  Wonder if perhaps this would be a manifestation of discoid lupus.  Will refer to dermatology for further evaluation.  Orders Placed This Encounter  Procedures   Ambulatory referral to Dermatology    Referral Priority:   Urgent    Referral Type:   Consultation    Referral Reason:   Specialty Services Required    Requested Specialty:   Dermatology    Number of Visits Requested:   1   No orders of the defined types were placed in this encounter.  As a courtesy to patient for a technical issue that occurred with her appointment today I have elected not to charge this patient for her visit.  This has been verbalized to administrative staff as well  67, DO [facility] [phone]

## 2021-11-05 DIAGNOSIS — L570 Actinic keratosis: Secondary | ICD-10-CM | POA: Diagnosis not present

## 2021-11-05 DIAGNOSIS — X32XXXA Exposure to sunlight, initial encounter: Secondary | ICD-10-CM | POA: Diagnosis not present

## 2021-11-05 DIAGNOSIS — L308 Other specified dermatitis: Secondary | ICD-10-CM | POA: Diagnosis not present

## 2021-11-23 DIAGNOSIS — R300 Dysuria: Secondary | ICD-10-CM | POA: Diagnosis not present

## 2021-11-23 DIAGNOSIS — N3001 Acute cystitis with hematuria: Secondary | ICD-10-CM | POA: Diagnosis not present

## 2021-11-27 ENCOUNTER — Other Ambulatory Visit: Payer: Self-pay | Admitting: Family Medicine

## 2021-11-27 DIAGNOSIS — G4762 Sleep related leg cramps: Secondary | ICD-10-CM

## 2021-11-30 ENCOUNTER — Ambulatory Visit (INDEPENDENT_AMBULATORY_CARE_PROVIDER_SITE_OTHER): Payer: Medicare Other | Admitting: Family Medicine

## 2021-11-30 ENCOUNTER — Encounter: Payer: Self-pay | Admitting: Family Medicine

## 2021-11-30 VITALS — BP 145/83 | HR 75 | Temp 97.1°F | Ht 64.0 in | Wt 218.6 lb

## 2021-11-30 DIAGNOSIS — R600 Localized edema: Secondary | ICD-10-CM | POA: Diagnosis not present

## 2021-11-30 DIAGNOSIS — E034 Atrophy of thyroid (acquired): Secondary | ICD-10-CM

## 2021-11-30 DIAGNOSIS — G4762 Sleep related leg cramps: Secondary | ICD-10-CM

## 2021-11-30 MED ORDER — LEVOTHYROXINE SODIUM 75 MCG PO TABS: 75.0000 ug | ORAL_TABLET | Freq: Every day | ORAL | 3 refills | Status: DC

## 2021-11-30 MED ORDER — HYDROCHLOROTHIAZIDE 25 MG PO TABS
25.0000 mg | ORAL_TABLET | Freq: Every day | ORAL | 3 refills | Status: DC
Start: 2021-11-30 — End: 2022-05-31

## 2021-11-30 MED ORDER — ROPINIROLE HCL 0.25 MG PO TABS: ORAL_TABLET | ORAL | 3 refills | Status: DC

## 2021-11-30 MED ORDER — SIMVASTATIN 20 MG PO TABS: 20.0000 mg | ORAL_TABLET | Freq: Every day | ORAL | 3 refills | Status: DC

## 2021-11-30 NOTE — Progress Notes (Signed)
Subjective: CC: Hypothyroidism PCP: Raliegh Ip, DO Leslie Hensley is a 75 y.o. female presenting to clinic today for:  1.  Hypothyroidism Patient is compliant with Synthroid 75 mcg daily.  No tremor, heart palpitations, change in voice.  She has struggled with constipation occasionally but uses prune juice to relief.  Denies any missed doses.  No use of biotin containing products.   ROS: Per HPI  Allergies  Allergen Reactions   Codeine Hives   Cyclobenzaprine Swelling   Diclofenac Sodium Rash   Past Medical History:  Diagnosis Date   Brain aneurysm    Hyperlipidemia    Hypertension    Thyroid disease     Current Outpatient Medications:    ascorbic acid (VITAMIN C) 100 MG tablet, Take by mouth., Disp: , Rfl:    Cholecalciferol (VITAMIN D3) 25 MCG (1000 UT) CAPS, Take by mouth., Disp: , Rfl:    levocetirizine (XYZAL) 5 MG tablet, Take 0.5-1 tablets (2.5-5 mg total) by mouth at bedtime as needed for allergies (drainage)., Disp: 45 tablet, Rfl: 0   mirtazapine (REMERON) 15 MG tablet, Take 1 tablet (15 mg total) by mouth at bedtime., Disp: 30 tablet, Rfl: 12   mometasone (NASONEX) 50 MCG/ACT nasal spray, Place 2 sprays into the nose daily., Disp: 1 each, Rfl: 12   Omega-3 1000 MG CAPS, Take by mouth., Disp: , Rfl:    Vitamin E 400 units TABS, Take by mouth., Disp: , Rfl:    hydrochlorothiazide (HYDRODIURIL) 25 MG tablet, Take 1 tablet (25 mg total) by mouth daily., Disp: 90 tablet, Rfl: 3   levothyroxine (SYNTHROID) 75 MCG tablet, Take 1 tablet (75 mcg total) by mouth daily., Disp: 90 tablet, Rfl: 3   rOPINIRole (REQUIP) 0.25 MG tablet, Take 1 to 3 tablets by mouth at bedtime., Disp: 90 tablet, Rfl: 3   simvastatin (ZOCOR) 20 MG tablet, Take 1 tablet (20 mg total) by mouth daily., Disp: 90 tablet, Rfl: 3 Social History   Socioeconomic History   Marital status: Divorced    Spouse name: Not on file   Number of children: 3   Years of education: 10   Highest  education level: 10th grade  Occupational History   Occupation: retired  Tobacco Use   Smoking status: Former    Types: Cigarettes    Quit date: 12/21/1999    Years since quitting: 21.9   Smokeless tobacco: Never  Vaping Use   Vaping Use: Never used  Substance and Sexual Activity   Alcohol use: Not Currently   Drug use: Not Currently   Sexual activity: Not Currently    Birth control/protection: Surgical  Other Topics Concern   Not on file  Social History Narrative   Not on file   Social Determinants of Health   Financial Resource Strain: Low Risk    Difficulty of Paying Living Expenses: Not hard at all  Food Insecurity: No Food Insecurity   Worried About Programme researcher, broadcasting/film/video in the Last Year: Never true   Ran Out of Food in the Last Year: Never true  Transportation Needs: No Transportation Needs   Lack of Transportation (Medical): No   Lack of Transportation (Non-Medical): No  Physical Activity: Sufficiently Active   Days of Exercise per Week: 7 days   Minutes of Exercise per Session: 30 min  Stress: No Stress Concern Present   Feeling of Stress : Not at all  Social Connections: Moderately Integrated   Frequency of Communication with Friends and Family: More than  three times a week   Frequency of Social Gatherings with Friends and Family: More than three times a week   Attends Religious Services: More than 4 times per year   Active Member of [employer] or Organizations: Yes   Attends Engineer, structural: More than 4 times per year   Marital Status: Divorced  Catering manager Violence: Not At Risk   Fear of Current or Ex-Partner: No   Emotionally Abused: No   Physically Abused: No   Sexually Abused: No   Family History  Problem Relation Age of Onset   Heart disease Mother    Heart disease Father    Stroke Father    Heart disease Sister    Heart disease Brother    Diabetes Brother    Heart disease Brother    Diabetes Brother    Stroke Brother    Cancer  Brother     Objective: Office vital signs reviewed. BP (!) 145/83   Pulse 75   Temp (!) 97.1 F (36.2 C)   Ht 5\' 4"  (1.626 m)   Wt 218 lb 9.6 oz (99.2 kg)   SpO2 94%   BMI 37.52 kg/m   Physical Examination:  General: Awake, alert, well nourished, No acute distress HEENT: Normal, sclera white, MMM, no exophthalmos or goiter Cardio: regular rate and rhythm, S1S2 heard, no murmurs appreciated Pulm: clear to auscultation bilaterally, no wheezes, rhonchi or rales; normal work of breathing on room air  Assessment/ Plan: 75 y.o. female   Hypothyroidism due to acquired atrophy of thyroid - Plan: TSH, T4, Free  Nocturnal leg cramps - Plan: rOPINIRole (REQUIP) 0.25 MG tablet  Localized edema - Plan: hydrochlorothiazide (HYDRODIURIL) 25 MG tablet  Asymptomatic from a thyroid standpoint except for chronic constipation.  Check TSH, free T4.  Not yet due for any other labs.  Did not discuss nocturnal leg cramps nor localized edema but needed refills  No orders of the defined types were placed in this encounter.  Meds ordered this encounter  Medications   hydrochlorothiazide (HYDRODIURIL) 25 MG tablet    Sig: Take 1 tablet (25 mg total) by mouth daily.    Dispense:  90 tablet    Refill:  3   levothyroxine (SYNTHROID) 75 MCG tablet    Sig: Take 1 tablet (75 mcg total) by mouth daily.    Dispense:  90 tablet    Refill:  3   rOPINIRole (REQUIP) 0.25 MG tablet    Sig: Take 1 to 3 tablets by mouth at bedtime.    Dispense:  90 tablet    Refill:  3   simvastatin (ZOCOR) 20 MG tablet    Sig: Take 1 tablet (20 mg total) by mouth daily.    Dispense:  90 tablet    Refill:  3   Plan for shingles vaccination at next visit  67, DO [facility] [phone]

## 2021-12-01 LAB — T4, FREE: Free T4: 1.26 ng/dL (ref 0.82–1.77)

## 2021-12-01 LAB — TSH: TSH: 0.499 u[IU]/mL (ref 0.450–4.500)

## 2021-12-04 DIAGNOSIS — Z1231 Encounter for screening mammogram for malignant neoplasm of breast: Secondary | ICD-10-CM | POA: Diagnosis not present

## 2021-12-14 DIAGNOSIS — R928 Other abnormal and inconclusive findings on diagnostic imaging of breast: Secondary | ICD-10-CM | POA: Diagnosis not present

## 2022-01-04 ENCOUNTER — Ambulatory Visit (INDEPENDENT_AMBULATORY_CARE_PROVIDER_SITE_OTHER): Payer: Medicare Other | Admitting: *Deleted

## 2022-01-04 DIAGNOSIS — Z23 Encounter for immunization: Secondary | ICD-10-CM

## 2022-01-08 ENCOUNTER — Ambulatory Visit (INDEPENDENT_AMBULATORY_CARE_PROVIDER_SITE_OTHER): Payer: Medicare Other

## 2022-01-08 VITALS — Ht 64.0 in | Wt 215.0 lb

## 2022-01-08 DIAGNOSIS — E8940 Asymptomatic postprocedural ovarian failure: Secondary | ICD-10-CM | POA: Insufficient documentation

## 2022-01-08 DIAGNOSIS — Z0000 Encounter for general adult medical examination without abnormal findings: Secondary | ICD-10-CM | POA: Diagnosis not present

## 2022-01-08 DIAGNOSIS — Z90710 Acquired absence of both cervix and uterus: Secondary | ICD-10-CM | POA: Insufficient documentation

## 2022-01-08 NOTE — Patient Instructions (Signed)
Leslie Hensley , Thank you for taking time to come for your Medicare Wellness Visit. I appreciate your ongoing commitment to your health goals. Please review the following plan we discussed and let me know if I can assist you in the future.   Screening recommendations/referrals: Colonoscopy: no longer required Mammogram: Done 12/14/2021 - Repeat annually Bone Density: Done 1214/2020 - Repeat every 2 years *due Recommended yearly ophthalmology/optometry visit for glaucoma screening and checkup Recommended yearly dental visit for hygiene and checkup  Vaccinations: Influenza vaccine: Declined Pneumococcal vaccine: Declined Tdap vaccine: Done 06/24/2019 - Repeat in 10 years Shingles vaccine: Done 01/04/2022 - second dose scheduled   Covid-19:Declined  Advanced directives: Advance directive discussed with you today. Even though you declined this today, please call our office should you change your mind, and we can give you the proper paperwork for you to fill out.   Conditions/risks identified: Aim for 30 minutes of exercise or brisk walking each day, drink 6-8 glasses of water and eat lots of fruits and vegetables.   Next appointment: Follow up in one year for your annual wellness visit    Preventive Care 65 Years and Older, Female Preventive care refers to lifestyle choices and visits with your health care provider that can promote health and wellness. What does preventive care include? A yearly physical exam. This is also called an annual well check. Dental exams once or twice a year. Routine eye exams. Ask your health care provider how often you should have your eyes checked. Personal lifestyle choices, including: Daily care of your teeth and gums. Regular physical activity. Eating a healthy diet. Avoiding tobacco and drug use. Limiting alcohol use. Practicing safe sex. Taking low-dose aspirin every day. Taking vitamin and mineral supplements as recommended by your health care  provider. What happens during an annual well check? The services and screenings done by your health care provider during your annual well check will depend on your age, overall health, lifestyle risk factors, and family history of disease. Counseling  Your health care provider may ask you questions about your: Alcohol use. Tobacco use. Drug use. Emotional well-being. Home and relationship well-being. Sexual activity. Eating habits. History of falls. Memory and ability to understand (cognition). Work and work Astronomer. Reproductive health. Screening  You may have the following tests or measurements: Height, weight, and BMI. Blood pressure. Lipid and cholesterol levels. These may be checked every 5 years, or more frequently if you are over 32 years old. Skin check. Lung cancer screening. You may have this screening every year starting at age 53 if you have a 30-pack-year history of smoking and currently smoke or have quit within the past 15 years. Fecal occult blood test (FOBT) of the stool. You may have this test every year starting at age 70. Flexible sigmoidoscopy or colonoscopy. You may have a sigmoidoscopy every 5 years or a colonoscopy every 10 years starting at age 3. Hepatitis C blood test. Hepatitis B blood test. Sexually transmitted disease (STD) testing. Diabetes screening. This is done by checking your blood sugar (glucose) after you have not eaten for a while (fasting). You may have this done every 1-3 years. Bone density scan. This is done to screen for osteoporosis. You may have this done starting at age 14. Mammogram. This may be done every 1-2 years. Talk to your health care provider about how often you should have regular mammograms. Talk with your health care provider about your test results, treatment options, and if necessary, the need for more  tests. Vaccines  Your health care provider may recommend certain vaccines, such as: Influenza vaccine. This is  recommended every year. Tetanus, diphtheria, and acellular pertussis (Tdap, Td) vaccine. You may need a Td booster every 10 years. Zoster vaccine. You may need this after age 43. Pneumococcal 13-valent conjugate (PCV13) vaccine. One dose is recommended after age 2. Pneumococcal polysaccharide (PPSV23) vaccine. One dose is recommended after age 71. Talk to your health care provider about which screenings and vaccines you need and how often you need them. This information is not intended to replace advice given to you by your health care provider. Make sure you discuss any questions you have with your health care provider. Document Released: 01/12/2016 Document Revised: 09/04/2016 Document Reviewed: 10/17/2015 Elsevier Interactive Patient Education  2017 [employer] Prevention in the Home Falls can cause injuries. They can happen to people of all ages. There are many things you can do to make your home safe and to help prevent falls. What can I do on the outside of my home? Regularly fix the edges of walkways and driveways and fix any cracks. Remove anything that might make you trip as you walk through a door, such as a raised step or threshold. Trim any bushes or trees on the path to your home. Use bright outdoor lighting. Clear any walking paths of anything that might make someone trip, such as rocks or tools. Regularly check to see if handrails are loose or broken. Make sure that both sides of any steps have handrails. Any raised decks and porches should have guardrails on the edges. Have any leaves, snow, or ice cleared regularly. Use sand or salt on walking paths during winter. Clean up any spills in your garage right away. This includes oil or grease spills. What can I do in the bathroom? Use night lights. Install grab bars by the toilet and in the tub and shower. Do not use towel bars as grab bars. Use non-skid mats or decals in the tub or shower. If you need to sit down in  the shower, use a plastic, non-slip stool. Keep the floor dry. Clean up any water that spills on the floor as soon as it happens. Remove soap buildup in the tub or shower regularly. Attach bath mats securely with double-sided non-slip rug tape. Do not have throw rugs and other things on the floor that can make you trip. What can I do in the bedroom? Use night lights. Make sure that you have a light by your bed that is easy to reach. Do not use any sheets or blankets that are too big for your bed. They should not hang down onto the floor. Have a firm chair that has side arms. You can use this for support while you get dressed. Do not have throw rugs and other things on the floor that can make you trip. What can I do in the kitchen? Clean up any spills right away. Avoid walking on wet floors. Keep items that you use a lot in easy-to-reach places. If you need to reach something above you, use a strong step stool that has a grab bar. Keep electrical cords out of the way. Do not use floor polish or wax that makes floors slippery. If you must use wax, use non-skid floor wax. Do not have throw rugs and other things on the floor that can make you trip. What can I do with my stairs? Do not leave any items on the stairs. Make sure  that there are handrails on both sides of the stairs and use them. Fix handrails that are broken or loose. Make sure that handrails are as long as the stairways. Check any carpeting to make sure that it is firmly attached to the stairs. Fix any carpet that is loose or worn. Avoid having throw rugs at the top or bottom of the stairs. If you do have throw rugs, attach them to the floor with carpet tape. Make sure that you have a light switch at the top of the stairs and the bottom of the stairs. If you do not have them, ask someone to add them for you. What else can I do to help prevent falls? Wear shoes that: Do not have high heels. Have rubber bottoms. Are comfortable  and fit you well. Are closed at the toe. Do not wear sandals. If you use a stepladder: Make sure that it is fully opened. Do not climb a closed stepladder. Make sure that both sides of the stepladder are locked into place. Ask someone to hold it for you, if possible. Clearly mark and make sure that you can see: Any grab bars or handrails. First and last steps. Where the edge of each step is. Use tools that help you move around (mobility aids) if they are needed. These include: Canes. Walkers. Scooters. Crutches. Turn on the lights when you go into a dark area. Replace any light bulbs as soon as they burn out. Set up your furniture so you have a clear path. Avoid moving your furniture around. If any of your floors are uneven, fix them. If there are any pets around you, be aware of where they are. Review your medicines with your doctor. Some medicines can make you feel dizzy. This can increase your chance of falling. Ask your doctor what other things that you can do to help prevent falls. This information is not intended to replace advice given to you by your health care provider. Make sure you discuss any questions you have with your health care provider. Document Released: 10/12/2009 Document Revised: 05/23/2016 Document Reviewed: 01/20/2015 Elsevier Interactive Patient Education  2017 [employer].

## 2022-01-08 NOTE — Progress Notes (Signed)
Subjective:   Leslie Hensley is a 76 y.o. female who presents for Medicare Annual (Subsequent) preventive examination.  Virtual Visit via Telephone Note  I connected with  [name] on 01/08/22 at 10:30 AM EST by telephone and verified that I am speaking with the correct person using two identifiers.  Location: Patient: Home Provider: [facility] Persons participating in the virtual visit: patient/Nurse Health Advisor   I discussed the limitations, risks, security and privacy concerns of performing an evaluation and management service by telephone and the availability of in person appointments. The patient expressed understanding and agreed to proceed.  Interactive audio and video telecommunications were attempted between this nurse and patient, however failed, due to patient having technical difficulties OR patient did not have access to video capability.  We continued and completed visit with audio only.  Some vital signs may be absent or patient reported.   Francoise Chojnowski E Idabell Picking, LPN   Review of Systems     Cardiac Risk Factors include: advanced age (>55men, >62 women);obesity (BMI >30kg/m2);dyslipidemia;hypertension;Other (see comment), Risk factor comments: OSA - cannot tolerate CPAP     Objective:    Today's Vitals   01/08/22 1034  Weight: 215 lb (97.5 kg)  Height: 5\' 4"  (1.626 m)   Body mass index is 36.9 kg/m.  Advanced Directives 01/08/2022 01/04/2021 07/10/2020 12/21/2019  Does Patient Have a Medical Advance Directive? No No Yes No  Would patient like information on creating a medical advance directive? No - Patient declined No - Patient declined - No - Patient declined    Current Medications (verified) Outpatient Encounter Medications as of 01/08/2022  Medication Sig   ascorbic acid (VITAMIN C) 100 MG tablet Take by mouth.   Cholecalciferol (VITAMIN D3) 25 MCG (1000 UT) CAPS Take by mouth.   hydrochlorothiazide (HYDRODIURIL) 25 MG tablet Take 1 tablet (25 mg total) by  mouth daily.   levocetirizine (XYZAL) 5 MG tablet Take 0.5-1 tablets (2.5-5 mg total) by mouth at bedtime as needed for allergies (drainage).   levothyroxine (SYNTHROID) 75 MCG tablet Take 1 tablet (75 mcg total) by mouth daily.   mirtazapine (REMERON) 15 MG tablet Take 1 tablet (15 mg total) by mouth at bedtime.   mometasone (NASONEX) 50 MCG/ACT nasal spray Place 2 sprays into the nose daily.   Omega-3 1000 MG CAPS Take by mouth.   rOPINIRole (REQUIP) 0.25 MG tablet Take 1 to 3 tablets by mouth at bedtime.   simvastatin (ZOCOR) 20 MG tablet Take 1 tablet (20 mg total) by mouth daily.   Vitamin E 400 units TABS Take by mouth.   No facility-administered encounter medications on file as of 01/08/2022.    Allergies (verified) Codeine, Cyclobenzaprine, and Diclofenac sodium   History: Past Medical History:  Diagnosis Date   Brain aneurysm    Hyperlipidemia    Hypertension    Thyroid disease    Past Surgical History:  Procedure Laterality Date   ABDOMINAL HYSTERECTOMY     BRAIN SURGERY     aneurysm   FOOT SURGERY Left    spur removed   KIDNEY STONE SURGERY     Family History  Problem Relation Age of Onset   Heart disease Mother    Heart disease Father    Stroke Father    Heart disease Sister    Heart disease Brother    Diabetes Brother    Heart disease Brother    Diabetes Brother    Stroke Brother    Cancer Brother  Social History   Socioeconomic History   Marital status: Divorced    Spouse name: Not on file   Number of children: 3   Years of education: 10   Highest education level: 10th grade  Occupational History   Occupation: retired  Tobacco Use   Smoking status: Former    Types: Cigarettes    Quit date: 12/21/1999    Years since quitting: 22.0   Smokeless tobacco: Never  Vaping Use   Vaping Use: Never used  Substance and Sexual Activity   Alcohol use: Not Currently   Drug use: Not Currently   Sexual activity: Not Currently    Birth  control/protection: Surgical  Other Topics Concern   Not on file  Social History Narrative   Not on file   Social Determinants of Health   Financial Resource Strain: Low Risk    Difficulty of Paying Living Expenses: Not hard at all  Food Insecurity: No Food Insecurity   Worried About Programme researcher, broadcasting/film/videounning Out of Food in the Last Year: Never true   Ran Out of Food in the Last Year: Never true  Transportation Needs: No Transportation Needs   Lack of Transportation (Medical): No   Lack of Transportation (Non-Medical): No  Physical Activity: Sufficiently Active   Days of Exercise per Week: 7 days   Minutes of Exercise per Session: 30 min  Stress: No Stress Concern Present   Feeling of Stress : Not at all  Social Connections: Moderately Integrated   Frequency of Communication with Friends and Family: More than three times a week   Frequency of Social Gatherings with Friends and Family: More than three times a week   Attends Religious Services: More than 4 times per year   Active Member of [employer] or Organizations: Yes   Attends Engineer, structuralClub or Organization Meetings: More than 4 times per year   Marital Status: Divorced    Tobacco Counseling Counseling given: Not Answered   Clinical Intake:  Pre-visit preparation completed: Yes  Pain : No/denies pain     BMI - recorded: 36.9 Nutritional Status: BMI > 30  Obese Nutritional Risks: None Diabetes: No  How often do you need to have someone help you when you read instructions, pamphlets, or other written materials from your doctor or pharmacy?: 1 - Never  Diabetic? no  Interpreter Needed?: No  Information entered by :: Carlen Rebuck, LPN   Activities of Daily Living In your present state of health, do you have any difficulty performing the following activities: 01/08/2022  Hearing? N  Vision? N  Difficulty concentrating or making decisions? N  Walking or climbing stairs? N  Dressing or bathing? N  Doing errands, shopping? N  Preparing Food  and eating ? N  Using the Toilet? N  In the past six months, have you accidently leaked urine? N  Do you have problems with loss of bowel control? N  Managing your Medications? N  Managing your Finances? N  Housekeeping or managing your Housekeeping? N  Some recent data might be hidden    Patient Care Team: Raliegh IpGottschalk, Ashly M, DO as PCP - General (Family Medicine)  Indicate any recent Medical Services you may have received from other than [facility] providers in the past year (date may be approximate).     Assessment:   This is a routine wellness examination for HarmonyNancy.  Hearing/Vision screen Hearing Screening - Comments:: Denies hearing difficulties  Vision Screening - Comments:: Wears reading glasses prn only - no eye doctor at this time  Dietary issues and exercise activities discussed: Current Exercise Habits: Home exercise routine, Type of exercise: walking, Time (Minutes): 30, Frequency (Times/Week): 7, Weekly Exercise (Minutes/Week): 210, Intensity: Mild, Exercise limited by: None identified   Goals Addressed   None    Depression Screen PHQ 2/9 Scores 01/08/2022 11/30/2021 11/30/2021 10/31/2021 03/02/2021 02/13/2021 01/04/2021  PHQ - 2 Score 1 0 0 0 0 0 1  PHQ- 9 Score 1 2 - - 0 - -    Fall Risk Fall Risk  11/30/2021 10/31/2021 03/02/2021 02/13/2021 01/04/2021  Falls in the past year? 0 0 0 0 0  Number falls in past yr: - - - - -  Injury with Fall? - - - - -  Follow up - - - - -    FALL RISK PREVENTION PERTAINING TO THE HOME:  Any stairs in or around the home? No  If so, are there any without handrails? No  Home free of loose throw rugs in walkways, pet beds, electrical cords, etc? Yes  Adequate lighting in your home to reduce risk of falls? Yes   ASSISTIVE DEVICES UTILIZED TO PREVENT FALLS:  Life alert? No  Use of a cane, walker or w/c? No  Grab bars in the bathroom? No  Shower chair or bench in shower? No  Elevated toilet seat or a handicapped toilet? No   TIMED UP AND  GO:  Was the test performed? No . Telephonic visit  Cognitive Function: Normal cognitive status assessed by direct observation by this Nurse Health Advisor. No abnormalities found.       6CIT Screen 01/04/2021 12/21/2019  What Year? 0 points 0 points  What month? 0 points 0 points  What time? 0 points 0 points  Count back from 20 0 points 0 points  Months in reverse 2 points 2 points  Repeat phrase 0 points 0 points  Total Score 2 2    Immunizations Immunization History  Administered Date(s) Administered   Td 06/24/2019   Zoster Recombinat (Shingrix) 01/04/2022    TDAP status: Up to date  Flu Vaccine status: Declined, Education has been provided regarding the importance of this vaccine but patient still declined. Advised may receive this vaccine at local pharmacy or Health Dept. Aware to provide a copy of the vaccination record if obtained from local pharmacy or Health Dept. Verbalized acceptance and understanding.  Pneumococcal vaccine status: Declined,  Education has been provided regarding the importance of this vaccine but patient still declined. Advised may receive this vaccine at local pharmacy or Health Dept. Aware to provide a copy of the vaccination record if obtained from local pharmacy or Health Dept. Verbalized acceptance and understanding.   Covid-19 vaccine status: Declined, Education has been provided regarding the importance of this vaccine but patient still declined. Advised may receive this vaccine at local pharmacy or Health Dept.or vaccine clinic. Aware to provide a copy of the vaccination record if obtained from local pharmacy or Health Dept. Verbalized acceptance and understanding.  Qualifies for Shingles Vaccine? Yes   Zostavax completed No   Shingrix Completed?: No.    Education has been provided regarding the importance of this vaccine. Patient has been advised to call insurance company to determine out of pocket expense if they have not yet received this  vaccine. Advised may also receive vaccine at local pharmacy or Health Dept. Verbalized acceptance and understanding.  Screening Tests Health Maintenance  Topic Date Due   COVID-19 Vaccine (1) Never done   DEXA SCAN  12/12/2021  INFLUENZA VACCINE  03/29/2022 (Originally 07/30/2021)   Pneumonia Vaccine 7065+ Years old (1 - PCV) 11/30/2022 (Originally 07/07/1952)   COLONOSCOPY (Pts 45-2763yrs Insurance coverage will need to be confirmed)  11/30/2022 (Originally 07/07/1992)   Hepatitis C Screening  11/30/2022 (Originally 07/07/1964)   Zoster Vaccines- Shingrix (2 of 2) 03/01/2022   MAMMOGRAM  12/04/2022   TETANUS/TDAP  06/23/2029   HPV VACCINES  Aged Out    Health Maintenance  Health Maintenance Due  Topic Date Due   COVID-19 Vaccine (1) Never done   DEXA SCAN  12/12/2021    Colorectal cancer screening: No longer required.   Mammogram status: Completed 12/14/2021. Repeat every year  Bone Density status: Completed 12/13/2019. Results reflect: Bone density results: OSTEOPENIA. Repeat every 2 years.  Lung Cancer Screening: (Low Dose CT Chest recommended if Age 96-80 years, 30 pack-year currently smoking OR have quit w/in 15years.) does not qualify.  Additional Screening:  Hepatitis C Screening: does qualify; Due  Vision Screening: Recommended annual ophthalmology exams for early detection of glaucoma and other disorders of the eye. Is the patient up to date with their annual eye exam?  No  Who is the provider or what is the name of the office in which the patient attends annual eye exams? None If pt is not established with a provider, would they like to be referred to a provider to establish care? No .   Dental Screening: Recommended annual dental exams for proper oral hygiene  Community Resource Referral / Chronic Care Management: CRR required this visit?  No   CCM required this visit?  No      Plan:     I have personally reviewed and noted the following in the patients chart:    Medical and social history Use of alcohol, tobacco or illicit drugs  Current medications and supplements including opioid prescriptions.  Functional ability and status Nutritional status Physical activity Advanced directives List of other physicians Hospitalizations, surgeries, and ER visits in previous 12 months Vitals Screenings to include cognitive, depression, and falls Referrals and appointments  In addition, I have reviewed and discussed with patient certain preventive protocols, quality metrics, and best practice recommendations. A written personalized care plan for preventive services as well as general preventive health recommendations were provided to patient.     [place] Constablemy E Layce Sprung, LPN   4/09/81191/09/2022   Nurse Notes: none

## 2022-01-16 DIAGNOSIS — L308 Other specified dermatitis: Secondary | ICD-10-CM | POA: Diagnosis not present

## 2022-01-22 ENCOUNTER — Other Ambulatory Visit: Payer: Self-pay | Admitting: Family Medicine

## 2022-01-22 DIAGNOSIS — F418 Other specified anxiety disorders: Secondary | ICD-10-CM

## 2022-01-22 DIAGNOSIS — F5101 Primary insomnia: Secondary | ICD-10-CM

## 2022-02-12 ENCOUNTER — Telehealth: Payer: Self-pay | Admitting: Family Medicine

## 2022-02-20 ENCOUNTER — Ambulatory Visit (INDEPENDENT_AMBULATORY_CARE_PROVIDER_SITE_OTHER): Payer: Medicare Other | Admitting: Family Medicine

## 2022-02-20 ENCOUNTER — Encounter: Payer: Self-pay | Admitting: Family Medicine

## 2022-02-20 MED ORDER — ASPIRIN 81 MG PO TBEC: 81.0000 mg | DELAYED_RELEASE_TABLET | Freq: Every day | ORAL | 3 refills | Status: AC

## 2022-02-20 MED ORDER — VITAMIN D3 125 MCG (5000 UT) PO CAPS: 1.0000 | ORAL_CAPSULE | Freq: Every day | ORAL | 3 refills | Status: AC

## 2022-02-20 MED ORDER — FISH OIL 645 MG PO CAPS: 1.0000 | ORAL_CAPSULE | Freq: Every day | ORAL | 3 refills | Status: AC

## 2022-02-20 MED ORDER — ASCORBIC ACID 500 MG PO TABS: 500.0000 mg | ORAL_TABLET | Freq: Every day | ORAL | 3 refills | Status: AC

## 2022-02-20 MED ORDER — VITAMIN E 180 MG (400 UNIT) PO CAPS: 400.0000 [IU] | ORAL_CAPSULE | Freq: Every day | ORAL | 3 refills | Status: AC

## 2022-02-20 NOTE — Progress Notes (Signed)
Erroneous appt. Only needed vitamins and a form resent from September

## 2022-03-11 ENCOUNTER — Ambulatory Visit (INDEPENDENT_AMBULATORY_CARE_PROVIDER_SITE_OTHER): Payer: Medicare Other | Admitting: *Deleted

## 2022-03-11 DIAGNOSIS — Z23 Encounter for immunization: Secondary | ICD-10-CM

## 2022-03-11 NOTE — Progress Notes (Signed)
Pt given 2nd shingrix injec r-deltoid. Pt  tol well ?

## 2022-03-13 ENCOUNTER — Ambulatory Visit: Payer: Medicare Other | Admitting: Family Medicine

## 2022-04-08 ENCOUNTER — Other Ambulatory Visit: Payer: Self-pay | Admitting: Family Medicine

## 2022-04-08 DIAGNOSIS — F418 Other specified anxiety disorders: Secondary | ICD-10-CM

## 2022-04-08 DIAGNOSIS — F5101 Primary insomnia: Secondary | ICD-10-CM

## 2022-04-15 ENCOUNTER — Ambulatory Visit (INDEPENDENT_AMBULATORY_CARE_PROVIDER_SITE_OTHER): Payer: Medicare Other | Admitting: Family Medicine

## 2022-04-15 ENCOUNTER — Telehealth: Payer: Self-pay | Admitting: Family Medicine

## 2022-04-15 DIAGNOSIS — N3001 Acute cystitis with hematuria: Secondary | ICD-10-CM | POA: Diagnosis not present

## 2022-04-15 DIAGNOSIS — R399 Unspecified symptoms and signs involving the genitourinary system: Secondary | ICD-10-CM | POA: Diagnosis not present

## 2022-04-15 DIAGNOSIS — Z87442 Personal history of urinary calculi: Secondary | ICD-10-CM

## 2022-04-15 DIAGNOSIS — N390 Urinary tract infection, site not specified: Secondary | ICD-10-CM

## 2022-04-15 LAB — MICROSCOPIC EXAMINATION
RBC, Urine: >30 /hpf — AB (ref 0–2)
Renal Epithel, UA: NONE SEEN /hpf

## 2022-04-15 LAB — URINALYSIS, ROUTINE W REFLEX MICROSCOPIC
Bilirubin, UA: NEGATIVE
Glucose, UA: NEGATIVE
Ketones, UA: NEGATIVE
Nitrite, UA: NEGATIVE
Specific Gravity, UA: 1.025 (ref 1.005–1.030)
Urobilinogen, Ur: 1 mg/dL (ref 0.2–1.0)
pH, UA: 7 (ref 5.0–7.5)

## 2022-04-15 MED ORDER — PHENAZOPYRIDINE HCL 200 MG PO TABS: 200.0000 mg | ORAL_TABLET | Freq: Three times a day (TID) | ORAL | 0 refills | Status: AC | PRN

## 2022-04-15 MED ORDER — CEPHALEXIN 500 MG PO CAPS: 500.0000 mg | ORAL_CAPSULE | Freq: Four times a day (QID) | ORAL | 0 refills | Status: AC

## 2022-04-15 NOTE — Telephone Encounter (Signed)
Pt called stating that she needed a Rx for UTI ASAP. ? ?Explained to pt that we didn't have anymore openings for today but that I could schedule her for first thing tomorrow morning. ? ?Pt was very rude and declined offer. Said she needed something NOW and that Dr Nadine Counts told her if she ever needed anything, to call and she would work her in.  ? ?Please advise. ?

## 2022-04-15 NOTE — Progress Notes (Signed)
Telephone visit ? ?Subjective: ?CC: UTI ?PCP: Raliegh Ip, DO ?[id number] Leslie Hensley is a 76 y.o. female calls for telephone consult today. Patient provides verbal consent for consult held via phone. ? ?Due to COVID-19 pandemic this visit was conducted virtually. This visit type was conducted due to national recommendations for restrictions regarding the COVID-19 Pandemic (Leslie.g. social distancing, sheltering in place) in an effort to limit this patient's exposure and mitigate transmission in our community. All issues noted in this document were discussed and addressed.  A physical exam was not performed with this format.  ? ?Location of patient: home ?Location of provider: [facility] ?Others present for call: UTI ? ?1. UTI ?Onset pelvic pressure and urinary urgency started today.  No flank pain, nausea, vomiting or fever.  She reports dysuria.  She does have a history of renal stones but notes that this does not feel that way.  She has been hydrating really well over the last several days that she is not sure why this has occurred.  No soda intake as of late. ? ? ?ROS: Per HPI ? ?Allergies  ?Allergen Reactions  ? Codeine Hives  ? Cyclobenzaprine Swelling  ? Diclofenac Sodium Rash  ? ?Past Medical History:  ?Diagnosis Date  ? Brain aneurysm   ? Hyperlipidemia   ? Hypertension   ? Thyroid disease   ? ? ?Current Outpatient Medications:  ?  ascorbic acid (VITAMIN C) 500 MG tablet, Take 1 tablet (500 mg total) by mouth daily., Disp: 90 tablet, Rfl: 3 ?  aspirin 81 MG EC tablet, Take 1 tablet (81 mg total) by mouth daily. Swallow whole., Disp: 90 tablet, Rfl: 3 ?  Cholecalciferol (VITAMIN D3) 125 MCG (5000 UT) CAPS, Take 1 capsule (5,000 Units total) by mouth daily., Disp: 90 capsule, Rfl: 3 ?  hydrochlorothiazide (HYDRODIURIL) 25 MG tablet, Take 1 tablet (25 mg total) by mouth daily., Disp: 90 tablet, Rfl: 3 ?  levocetirizine (XYZAL) 5 MG tablet, Take 0.5-1 tablets (2.5-5 mg total) by mouth at bedtime as needed for  allergies (drainage)., Disp: 45 tablet, Rfl: 0 ?  levothyroxine (SYNTHROID) 75 MCG tablet, Take 1 tablet (75 mcg total) by mouth daily., Disp: 90 tablet, Rfl: 3 ?  mirtazapine (REMERON) 15 MG tablet, TAKE 1 TABLET BY MOUTH AT BEDTIME, Disp: 90 tablet, Rfl: 0 ?  mometasone (NASONEX) 50 MCG/ACT nasal spray, Place 2 sprays into the nose daily., Disp: 1 each, Rfl: 12 ?  Omega-3 Fatty Acids (FISH OIL) 645 MG CAPS, Take 1 capsule by mouth daily., Disp: 90 capsule, Rfl: 3 ?  rOPINIRole (REQUIP) 0.25 MG tablet, Take 1 to 3 tablets by mouth at bedtime., Disp: 90 tablet, Rfl: 3 ?  simvastatin (ZOCOR) 20 MG tablet, Take 1 tablet (20 mg total) by mouth daily., Disp: 90 tablet, Rfl: 3 ?  vitamin Leslie 180 MG (400 UNITS) capsule, Take 1 capsule (400 Units total) by mouth daily., Disp: 90 capsule, Rfl: 3 ? ?Assessment/ Plan: ?76 y.o. female  ? ?Acute cystitis with hematuria - Plan: Urinalysis, Routine w reflex microscopic, Urine Culture, cephALEXin (KEFLEX) 500 MG capsule, phenazopyridine (PYRIDIUM) 200 MG tablet ? ?History of kidney stones ? ?Urinalysis with greater than 30 red blood cells, 11-30 white blood cells and few bacteria.  This has been sent for culture.  She is nitrite negative.  I am concerned that there is possibly a urinary stone causing the symptoms but she denies flank pain, nausea, vomiting or fevers to suggest more severe infection or process.  We discussed  continued hydration, red flag signs and symptoms warranting further evaluation.  She voiced good understanding will follow-up as needed ? ?Start time: 4:03pm ?End time: 4:08p ? ?Total time spent on patient care (including telephone call/ virtual visit): 5 minutes ? ?Raliegh Ip, DO ?[facility] ?[phone] ? ?

## 2022-04-15 NOTE — Telephone Encounter (Signed)
Ok to double book her in.  I will call when I can. Have her come leave a urine sample ASAP. ?

## 2022-04-17 LAB — URINE CULTURE

## 2022-05-01 ENCOUNTER — Encounter: Payer: Self-pay | Admitting: Nurse Practitioner

## 2022-05-01 ENCOUNTER — Ambulatory Visit (INDEPENDENT_AMBULATORY_CARE_PROVIDER_SITE_OTHER): Payer: Medicare Other | Admitting: Nurse Practitioner

## 2022-05-01 DIAGNOSIS — J00 Acute nasopharyngitis [common cold]: Secondary | ICD-10-CM

## 2022-05-01 MED ORDER — PSEUDOEPH-BROMPHEN-DM 30-2-10 MG/5ML PO SYRP: 2.5000 mL | ORAL_SOLUTION | Freq: Four times a day (QID) | ORAL | 0 refills | Status: DC | PRN

## 2022-05-01 NOTE — Patient Instructions (Signed)
Viral Respiratory Infection A respiratory infection is an illness that affects part of the respiratory system, such as the lungs, nose, or throat. A respiratory infection that is caused by a virus is called a viral respiratory infection. Common types of viral respiratory infections include: A cold. The flu (influenza). A respiratory syncytial virus (RSV) infection. What are the causes? This condition is caused by a virus. The virus may spread through contact with droplets or direct contact with infected people or their mucus or secretions. The virus may spread from person to person (is contagious). What are the signs or symptoms? Symptoms of this condition include: A stuffy or runny nose. A sore throat or cough. Shortness of breath or difficulty breathing. Yellow or green mucus (sputum). Other symptoms may include: A fever. Sweating or chills. Fatigue. Achy muscles. A headache. How is this diagnosed? This condition may be diagnosed based on: Your symptoms. A physical exam. Testing of secretions from the nose or throat. Chest X-ray. How is this treated? This condition may be treated with medicines, such as: Antiviral medicine. This may shorten the length of time a person has symptoms. Expectorants. These make it easier to cough up mucus. Decongestant nasal sprays. Acetaminophen or NSAIDs, such as ibuprofen, to relieve fever and pain. Antibiotic medicines are not prescribed for viral infections.This is because antibiotics are designed to kill bacteria. They do not kill viruses. Follow these instructions at home: Managing pain and congestion Take over-the-counter and prescription medicines only as told by your health care provider. If you have a sore throat, gargle with a mixture of salt and water 3-4 times a day or as needed. To make salt water, completely dissolve -1 tsp (3-6 g) of salt in 1 cup (237 mL) of warm water. Use nose drops made from salt water to ease congestion and  soften raw skin around your nose. Take 2 tsp (10 mL) of honey at bedtime to lessen coughing at night. Do not give honey to children who are younger than 1 year. Drink enough fluid to keep your urine pale yellow. This helps prevent dehydration and helps loosen up mucus. General instructions  Rest as much as possible. Do not drink alcohol. Do not use any products that contain nicotine or tobacco. These products include cigarettes, chewing tobacco, and vaping devices, such as e-cigarettes. If you need help quitting, ask your health care provider. Keep all follow-up visits. This is important. How is this prevented?     Get an annual flu shot. You may get the flu shot in late summer, fall, or winter. Ask your health care provider when you should get your flu shot. Avoid spreading your infection to other people. If you are sick: Wash your hands with soap and water often, especially after you cough or sneeze. Wash for at least 20 seconds. If soap and water are not available, use alcohol-based hand sanitizer. Cover your mouth when you cough. Cover your nose and mouth when you sneeze. Do not share cups or eating utensils. Clean commonly used objects often. Clean commonly touched surfaces. Stay home from work or school as told by your health care provider. Avoid contact with people who are sick during cold and flu season. This is generally fall and winter. Contact a health care provider if: Your symptoms last for 10 days or longer. Your symptoms get worse over time. You have severe sinus pain in your face or forehead. The glands in your jaw or neck become very swollen. You have shortness of breath. Get   help right away if you: Feel pain or pressure in your chest. Have trouble breathing. Faint or feel like you will faint. Have severe and persistent vomiting. Feel confused or disoriented. These symptoms may represent a serious problem that is an emergency. Do not wait to see if the symptoms will  go away. Get medical help right away. Call your local emergency services (911 in the [place] Do not drive yourself to the hospital. Summary A respiratory infection is an illness that affects part of the respiratory system, such as the lungs, nose, or throat. A respiratory infection that is caused by a virus is called a viral respiratory infection. Common types of viral respiratory infections include a cold, influenza, and respiratory syncytial virus (RSV) infection. Symptoms of this condition include a stuffy or runny nose, cough, fatigue, achy muscles, sore throat, and fevers or chills. Antibiotic medicines are not prescribed for viral infections. This is because antibiotics are designed to kill bacteria. They are not effective against viruses. This information is not intended to replace advice given to you by your health care provider. Make sure you discuss any questions you have with your health care provider. Document Revised: 03/22/2021 Document Reviewed: 03/22/2021 Elsevier Patient Education  2023 [employer]  

## 2022-05-01 NOTE — Progress Notes (Signed)
? ?  Virtual Visit  Note ?Due to COVID-19 pandemic this visit was conducted virtually. This visit type was conducted due to national recommendations for restrictions regarding the COVID-19 Pandemic (e.g. social distancing, sheltering in place) in an effort to limit this patient's exposure and mitigate transmission in our community. All issues noted in this document were discussed and addressed.  A physical exam was not performed with this format. ? ?I connected with [name] on 05/01/22 at 10:15 am by telephone and verified that I am speaking with the correct person using two identifiers. Leslie Hensley is currently located at home  during visit. The provider, Ivy Lynn, NP is located in their office at time of visit. ? ?I discussed the limitations, risks, security and privacy concerns of performing an evaluation and management service by telephone and the availability of in person appointments. I also discussed with the patient that there may be a patient responsible charge related to this service. The patient expressed understanding and agreed to proceed. ? ? ?History and Present Illness: ? ?URI  ?This is a new problem. Episode onset: in the past 3 days. The problem has been unchanged. There has been no fever. Associated symptoms include congestion, coughing and rhinorrhea. Pertinent negatives include no abdominal pain, headaches, nausea or rash. She has tried nothing for the symptoms.  ? ? ? ?Review of Systems  ?Constitutional: Negative.  Negative for chills and fever.  ?HENT:  Positive for congestion and rhinorrhea.   ?Respiratory:  Positive for cough.   ?Gastrointestinal:  Negative for abdominal pain and nausea.  ?Skin:  Negative for rash.  ?Neurological:  Negative for headaches.  ?All other systems reviewed and are negative. ? ? ?Observations/Objective: ?Tele-visit patient not in distress ? ?Assessment and Plan: ?Take meds as prescribed ?- Use a cool mist humidifier  ?-Use saline nose sprays  frequently ?-Force fluids ?-For fever or aches or pains- take Tylenol or ibuprofen. ?-If symptoms do not improve, she may need to be COVID tested to rule this out ?Follow up with worsening unresolved symptoms  ? ?Follow Up Instructions: ?Follow up with worsening unresolved symptoms ? ?  ?I discussed the assessment and treatment plan with the patient. The patient was provided an opportunity to ask questions and all were answered. The patient agreed with the plan and demonstrated an understanding of the instructions. ?  ?The patient was advised to call back or seek an in-person evaluation if the symptoms worsen or if the condition fails to improve as anticipated. ? ?The above assessment and management plan was discussed with the patient. The patient verbalized understanding of and has agreed to the management plan. Patient is aware to call the clinic if symptoms persist or worsen. Patient is aware when to return to the clinic for a follow-up visit. Patient educated on when it is appropriate to go to the emergency department.  ? ?Time call ended:  10:25 am  ? ?I provided 10 minutes of  non face-to-face time during this encounter. ? ? ? ?Ivy Lynn, NP ?  ?

## 2022-05-08 ENCOUNTER — Ambulatory Visit (INDEPENDENT_AMBULATORY_CARE_PROVIDER_SITE_OTHER): Payer: Medicare Other | Admitting: Nurse Practitioner

## 2022-05-08 ENCOUNTER — Encounter: Payer: Self-pay | Admitting: Nurse Practitioner

## 2022-05-08 ENCOUNTER — Other Ambulatory Visit: Payer: Self-pay | Admitting: Family Medicine

## 2022-05-08 VITALS — BP 122/75 | HR 71 | Ht 66.0 in | Wt 222.0 lb

## 2022-05-08 DIAGNOSIS — J069 Acute upper respiratory infection, unspecified: Secondary | ICD-10-CM | POA: Diagnosis not present

## 2022-05-08 DIAGNOSIS — G4762 Sleep related leg cramps: Secondary | ICD-10-CM

## 2022-05-08 MED ORDER — AMOXICILLIN-POT CLAVULANATE 875-125 MG PO TABS: 1.0000 | ORAL_TABLET | Freq: Two times a day (BID) | ORAL | 0 refills | Status: DC

## 2022-05-08 MED ORDER — PREDNISONE 10 MG (21) PO TBPK: ORAL_TABLET | ORAL | 0 refills | Status: DC

## 2022-05-08 NOTE — Patient Instructions (Signed)

## 2022-05-08 NOTE — Progress Notes (Signed)
? ?Acute Office Visit ? ?Subjective:  ? ?  ?Patient ID: Leslie Hensley, female    DOB: Nov 09, 1946, 76 y.o.   MRN: [id number] ? ?Chief Complaint  ?Patient presents with  ? Cough  ?  Been coughing for over 3 weeks now , coughing up greenish stuff  ? Wheezing  ?  This has gotten worse , over the last week  ? Nasal Congestion  ? ? ?Cough ?This is a new problem. The current episode started in the past 7 days. The problem has been gradually worsening. The cough is Productive of sputum. Associated symptoms include nasal congestion, postnasal drip and wheezing. Pertinent negatives include no chest pain, chills, ear congestion, fever, headaches, hemoptysis, rash, sore throat or shortness of breath. Nothing aggravates the symptoms. She has tried OTC cough suppressant for the symptoms.  ?Wheezing  ?This is a new problem. The current episode started yesterday. The problem occurs intermittently. The problem has been unchanged. Associated symptoms include coughing. Pertinent negatives include no chest pain, chills, fever, headaches, hemoptysis, rash, shortness of breath or sore throat. She has tried OTC cough suppressant for the symptoms.  ? ? ?Review of Systems  ?Constitutional: Negative.  Negative for chills and fever.  ?HENT:  Positive for postnasal drip. Negative for sore throat.   ?Respiratory:  Positive for cough and wheezing. Negative for hemoptysis and shortness of breath.   ?Cardiovascular:  Negative for chest pain.  ?Skin: Negative.  Negative for rash.  ?Neurological:  Negative for headaches.  ?All other systems reviewed and are negative. ? ? ?   ?Objective:  ?  ?BP 122/75 (BP Location: Right Arm)   Pulse 71   Ht 5\' 6"  (1.676 m)   Wt 222 lb (100.7 kg)   SpO2 97%   BMI 35.83 kg/m?  ?BP Readings from Last 3 Encounters:  ?05/08/22 122/75  ?02/20/22 136/78  ?11/30/21 (!) 145/83  ? ?Wt Readings from Last 3 Encounters:  ?05/08/22 222 lb (100.7 kg)  ?02/20/22 220 lb 12.8 oz (100.2 kg)  ?01/08/22 215 lb (97.5 kg)  ? ?   ? ?Physical Exam ?Vitals and nursing note reviewed.  ?Constitutional:   ?   Appearance: Normal appearance.  ?HENT:  ?   Head: Normocephalic.  ?   Right Ear: External ear normal.  ?   Left Ear: External ear normal.  ?   Nose: Congestion present.  ?   Mouth/Throat:  ?   Mouth: Mucous membranes are moist.  ?Eyes:  ?   Conjunctiva/sclera: Conjunctivae normal.  ?Cardiovascular:  ?   Rate and Rhythm: Normal rate and regular rhythm.  ?   Pulses: Normal pulses.  ?   Heart sounds: Normal heart sounds.  ?Pulmonary:  ?   Effort: Pulmonary effort is normal.  ?   Breath sounds: Wheezing present.  ?Abdominal:  ?   General: Bowel sounds are normal.  ?Skin: ?   General: Skin is warm.  ?   Findings: No rash.  ?Neurological:  ?   General: No focal deficit present.  ?   Mental Status: She is alert and oriented to person, place, and time.  ?Psychiatric:     ?   Behavior: Behavior normal.  ? ? ?No results found for any visits on 05/08/22. ? ? ?   ?Assessment & Plan:  ?Take meds as prescribed ?- Use a cool mist humidifier  ?-Use saline nose sprays frequently ?-Force fluids ?-For fever or aches or pains- take Tylenol or ibuprofen. ?-Augmentin 875-125 mg tablet by mouth  ?-  Prednisone by mouth for 6 days  ?-If symptoms do not improve, she may need to be COVID tested to rule this out ?Follow up with worsening unresolved symptoms  ?Problem List Items Addressed This Visit   ?None ?Visit Diagnoses   ? ? Upper respiratory infection with cough and congestion    -  Primary  ? Relevant Medications  ? amoxicillin-clavulanate (AUGMENTIN) 875-125 MG tablet  ? predniSONE (STERAPRED UNI-PAK 21 TAB) 10 MG (21) TBPK tablet  ? ?  ? ? ?Meds ordered this encounter  ?Medications  ? amoxicillin-clavulanate (AUGMENTIN) 875-125 MG tablet  ?  Sig: Take 1 tablet by mouth 2 (two) times daily.  ?  Dispense:  14 tablet  ?  Refill:  0  ?  Order Specific Question:   Supervising Provider  ?  AnswerMechele Claude [[id number]]  ? predniSONE (STERAPRED UNI-PAK 21 TAB) 10  MG (21) TBPK tablet  ?  Sig: 6 tablets day 1, 4 tablet day 2,  4 tablet day 3,  3 tablet daily 4, 2 tablet day 5,, 1 tablet day 6  ?  Dispense:  1 each  ?  Refill:  0  ?  Order Specific Question:   Supervising Provider  ?  AnswerMechele Claude [[id number]]  ? ? ?Return if symptoms worsen or fail to improve. ? ?Daryll Drown, NP ? ? ?

## 2022-05-31 ENCOUNTER — Ambulatory Visit (INDEPENDENT_AMBULATORY_CARE_PROVIDER_SITE_OTHER): Payer: Medicare Other | Admitting: Family Medicine

## 2022-05-31 ENCOUNTER — Encounter: Payer: Self-pay | Admitting: Family Medicine

## 2022-05-31 VITALS — BP 144/80 | HR 81 | Temp 98.2°F | Ht 66.0 in | Wt 219.0 lb

## 2022-05-31 DIAGNOSIS — F418 Other specified anxiety disorders: Secondary | ICD-10-CM

## 2022-05-31 DIAGNOSIS — E034 Atrophy of thyroid (acquired): Secondary | ICD-10-CM

## 2022-05-31 DIAGNOSIS — R600 Localized edema: Secondary | ICD-10-CM

## 2022-05-31 DIAGNOSIS — I10 Essential (primary) hypertension: Secondary | ICD-10-CM | POA: Diagnosis not present

## 2022-05-31 DIAGNOSIS — G4762 Sleep related leg cramps: Secondary | ICD-10-CM

## 2022-05-31 DIAGNOSIS — F5101 Primary insomnia: Secondary | ICD-10-CM

## 2022-05-31 MED ORDER — ROPINIROLE HCL 0.25 MG PO TABS: 0.2500 mg | ORAL_TABLET | Freq: Every day | ORAL | 3 refills | Status: DC

## 2022-05-31 MED ORDER — LEVOTHYROXINE SODIUM 75 MCG PO TABS: 75.0000 ug | ORAL_TABLET | Freq: Every day | ORAL | 3 refills | Status: DC

## 2022-05-31 MED ORDER — SIMVASTATIN 20 MG PO TABS: 20.0000 mg | ORAL_TABLET | Freq: Every day | ORAL | 3 refills | Status: DC

## 2022-05-31 MED ORDER — HYDROCHLOROTHIAZIDE 25 MG PO TABS: 25.0000 mg | ORAL_TABLET | Freq: Every day | ORAL | 3 refills | Status: DC

## 2022-05-31 MED ORDER — MIRTAZAPINE 15 MG PO TABS: 15.0000 mg | ORAL_TABLET | Freq: Every day | ORAL | 2 refills | Status: DC

## 2022-05-31 NOTE — Progress Notes (Signed)
Subjective: WL:[id number] PCP: Janora Norlander, DO BWI:OMBTD E Drennen is a 76 y.o. female presenting to clinic today for:  1. Hypothyroidism Reports compliance with her thyroid medications.  No reports of tremor, heart palpitations, change in bowel habits.  2.  Stress, sleep issues Patient reports good control of the symptoms with the mirtazapine.  No changes needed today.  3.  Hypertension Patient is compliant with HCTZ.  Edema is well controlled with this.   ROS: Per HPI  Allergies  Allergen Reactions   Codeine Hives   Cyclobenzaprine Swelling   Diclofenac Sodium Rash   Past Medical History:  Diagnosis Date   Brain aneurysm    Hyperlipidemia    Hypertension    Thyroid disease     Current Outpatient Medications:    ascorbic acid (VITAMIN C) 500 MG tablet, Take 1 tablet (500 mg total) by mouth daily., Disp: 90 tablet, Rfl: 3   aspirin 81 MG EC tablet, Take 1 tablet (81 mg total) by mouth daily. Swallow whole., Disp: 90 tablet, Rfl: 3   chlorhexidine (PERIDEX) 0.12 % solution, SMARTSIG:By Mouth, Disp: , Rfl:    Cholecalciferol (VITAMIN D3) 125 MCG (5000 UT) CAPS, Take 1 capsule (5,000 Units total) by mouth daily., Disp: 90 capsule, Rfl: 3   hydrochlorothiazide (HYDRODIURIL) 25 MG tablet, Take 1 tablet (25 mg total) by mouth daily., Disp: 90 tablet, Rfl: 3   levothyroxine (SYNTHROID) 75 MCG tablet, Take 1 tablet (75 mcg total) by mouth daily., Disp: 90 tablet, Rfl: 3   mirtazapine (REMERON) 15 MG tablet, TAKE 1 TABLET BY MOUTH AT BEDTIME, Disp: 90 tablet, Rfl: 0   Omega-3 Fatty Acids (FISH OIL) 645 MG CAPS, Take 1 capsule by mouth daily., Disp: 90 capsule, Rfl: 3   rOPINIRole (REQUIP) 0.25 MG tablet, TAKE 1 TO 3 TABLETS BY MOUTH AT BEDTIME, Disp: 270 tablet, Rfl: 0   simvastatin (ZOCOR) 20 MG tablet, Take 1 tablet (20 mg total) by mouth daily., Disp: 90 tablet, Rfl: 3   vitamin E 180 MG (400 UNITS) capsule, Take 1 capsule (400 Units total) by mouth daily., Disp:  90 capsule, Rfl: 3 Social History   Socioeconomic History   Marital status: Divorced    Spouse name: Not on file   Number of children: 3   Years of education: 10   Highest education level: 10th grade  Occupational History   Occupation: retired  Tobacco Use   Smoking status: Former    Types: Cigarettes    Quit date: 12/21/1999    Years since quitting: 22.4   Smokeless tobacco: Never  Vaping Use   Vaping Use: Never used  Substance and Sexual Activity   Alcohol use: Not Currently   Drug use: Not Currently   Sexual activity: Not Currently    Birth control/protection: Surgical  Other Topics Concern   Not on file  Social History Narrative   Not on file   Social Determinants of Health   Financial Resource Strain: Low Risk    Difficulty of Paying Living Expenses: Not hard at all  Food Insecurity: No Food Insecurity   Worried About Charity fundraiser in the Last Year: Never true   [place] in the Last Year: Never true  Transportation Needs: No Transportation Needs   Lack of Transportation (Medical): No   Lack of Transportation (Non-Medical): No  Physical Activity: Sufficiently Active   Days of Exercise per Week: 7 days   Minutes of Exercise per Session: 30 min  Stress: No  Stress Concern Present   Feeling of Stress : Not at all  Social Connections: Moderately Integrated   Frequency of Communication with Friends and Family: More than three times a week   Frequency of Social Gatherings with Friends and Family: More than three times a week   Attends Religious Services: More than 4 times per year   Active Member of [employer] or Organizations: Yes   Attends Music therapist: More than 4 times per year   Marital Status: Divorced  Human resources officer Violence: Not At Risk   Fear of Current or Ex-Partner: No   Emotionally Abused: No   Physically Abused: No   Sexually Abused: No   Family History  Problem Relation Age of Onset   Heart disease Mother    Heart  disease Father    Stroke Father    Heart disease Sister    Heart disease Brother    Diabetes Brother    Heart disease Brother    Diabetes Brother    Stroke Brother    Cancer Brother     Objective: Office vital signs reviewed. BP (!) 144/80   Pulse 81   Temp 98.2 F (36.8 C)   Ht '5\' 6"'  (7.793 m)   Wt 219 lb (99.3 kg)   SpO2 94%   BMI 35.35 kg/m   Physical Examination:  General: Awake, alert, well nourished, No acute distress HEENT: No exophthalmos.  No goiter Cardio: regular rate and rhythm, S1S2 heard, no murmurs appreciated Pulm: clear to auscultation bilaterally, no wheezes, rhonchi or rales; normal work of breathing on room air MSK: Normal gait and station Neuro: 2/4 patellar DTRs bilaterally Psych: mood stable, speech normal.    05/31/2022    1:00 PM 05/08/2022    8:11 AM 02/20/2022    3:58 PM  Depression screen PHQ 2/9  Decreased Interest 0 0 0  Down, Depressed, Hopeless 0 0 0  PHQ - 2 Score 0 0 0  Altered sleeping   0  Tired, decreased energy   0  Change in appetite   0  Trouble concentrating   0  Moving slowly or fidgety/restless   0  Suicidal thoughts   0  PHQ-9 Score   0  Difficult doing work/chores   Not difficult at all     Assessment/ Plan: 76 y.o. female   Hypothyroidism due to acquired atrophy of thyroid - Plan: TSH, T4, Free  Essential hypertension - Plan: CMP14+EGFR  Situational anxiety - Plan: mirtazapine (REMERON) 15 MG tablet  Primary insomnia - Plan: mirtazapine (REMERON) 15 MG tablet  Localized edema - Plan: hydrochlorothiazide (HYDRODIURIL) 25 MG tablet, CMP14+EGFR  Nocturnal leg cramps - Plan: rOPINIRole (REQUIP) 0.25 MG tablet, CMP14+EGFR  Doing well.  Check thyroid levels, CMP.  Meds have been renewed.  She may follow-up in 6 months for annual physical with fasting labs and DEXA scan  No orders of the defined types were placed in this encounter.  No orders of the defined types were placed in this encounter.    Janora Norlander, DO [place] [phone]

## 2022-06-18 ENCOUNTER — Other Ambulatory Visit: Payer: Self-pay | Admitting: Family Medicine

## 2022-06-18 DIAGNOSIS — F5101 Primary insomnia: Secondary | ICD-10-CM

## 2022-06-18 DIAGNOSIS — F418 Other specified anxiety disorders: Secondary | ICD-10-CM

## 2022-07-01 ENCOUNTER — Telehealth: Payer: Self-pay | Admitting: Family Medicine

## 2022-07-04 NOTE — Telephone Encounter (Signed)
Why is patient getting yearly CT scan? Last one was for lung nodule, but it was negative.

## 2022-07-05 NOTE — Telephone Encounter (Signed)
TTC but mailbox is full and can't leave message.

## 2022-07-05 NOTE — Telephone Encounter (Signed)
Pt returned missed call. Reviewed Christys note with her. Pt says she was never told that her last CT came back negative. Was just told that she needed to have a CT done every year to keep a check on her lungs because there were spots on her lungs that needed to monitored.  Pt says if her last CT came back negative and she doesn't need to keep a check on her lungs, then just disregard this message.

## 2022-07-08 NOTE — Telephone Encounter (Signed)
FYI

## 2022-08-17 ENCOUNTER — Other Ambulatory Visit: Payer: Self-pay | Admitting: Family Medicine

## 2022-08-17 DIAGNOSIS — G4762 Sleep related leg cramps: Secondary | ICD-10-CM

## 2022-08-19 ENCOUNTER — Telehealth: Payer: Self-pay | Admitting: Family Medicine

## 2022-08-19 DIAGNOSIS — G4762 Sleep related leg cramps: Secondary | ICD-10-CM

## 2022-08-19 MED ORDER — ROPINIROLE HCL 0.25 MG PO TABS: 0.2500 mg | ORAL_TABLET | Freq: Every day | ORAL | 3 refills | Status: DC

## 2022-08-19 NOTE — Telephone Encounter (Signed)
TC to pt, she is not using [facility] any longer, she is using [facility] sent same Rx from her June visit that her PCP sent in during her visit

## 2022-08-19 NOTE — Telephone Encounter (Signed)
  Prescription Request  08/19/2022  Is this a "Controlled Substance" medicine? no  Have you seen your PCP in the last 2 weeks? no  If YES, route message to pool  -  If NO, patient needs to be scheduled for appointment.  What is the name of the medication or equipment? rOPINIRole (REQUIP) 0.25 MG tablet  Have you contacted your pharmacy to request a refill? yes   Which pharmacy would you like this sent to? [facility] - [place], [place] - [address]   Patient notified that their request is being sent to the clinical staff for review and that they should receive a response within 2 business days.

## 2022-08-29 ENCOUNTER — Encounter: Payer: Self-pay | Admitting: Family Medicine

## 2022-08-29 ENCOUNTER — Ambulatory Visit (INDEPENDENT_AMBULATORY_CARE_PROVIDER_SITE_OTHER): Payer: Medicare Other | Admitting: Family Medicine

## 2022-08-29 VITALS — BP 128/74 | HR 94 | Temp 96.8°F | Ht 66.0 in | Wt 219.2 lb

## 2022-08-29 DIAGNOSIS — Z87442 Personal history of urinary calculi: Secondary | ICD-10-CM | POA: Diagnosis not present

## 2022-08-29 DIAGNOSIS — R300 Dysuria: Secondary | ICD-10-CM

## 2022-08-29 DIAGNOSIS — J069 Acute upper respiratory infection, unspecified: Secondary | ICD-10-CM

## 2022-08-29 LAB — URINALYSIS, ROUTINE W REFLEX MICROSCOPIC
Bilirubin, UA: NEGATIVE
Glucose, UA: NEGATIVE
Nitrite, UA: NEGATIVE
Specific Gravity, UA: 1.02 (ref 1.005–1.030)
Urobilinogen, Ur: 0.2 mg/dL (ref 0.2–1.0)
pH, UA: 7 (ref 5.0–7.5)

## 2022-08-29 LAB — MICROSCOPIC EXAMINATION
Epithelial Cells (non renal): NONE SEEN /hpf (ref 0–10)
RBC, Urine: >30 /hpf — AB (ref 0–2)
Renal Epithel, UA: NONE SEEN /hpf
WBC, UA: >30 /hpf — AB (ref 0–5)

## 2022-08-29 MED ORDER — BENZONATATE 100 MG PO CAPS: 100.0000 mg | ORAL_CAPSULE | Freq: Three times a day (TID) | ORAL | 0 refills | Status: DC | PRN

## 2022-08-29 MED ORDER — SULFAMETHOXAZOLE-TRIMETHOPRIM 800-160 MG PO TABS: 1.0000 | ORAL_TABLET | Freq: Two times a day (BID) | ORAL | 0 refills | Status: AC

## 2022-08-29 MED ORDER — TAMSULOSIN HCL 0.4 MG PO CAPS: 0.4000 mg | ORAL_CAPSULE | Freq: Every day | ORAL | 3 refills | Status: DC

## 2022-08-29 NOTE — Progress Notes (Signed)
Acute Office Visit  Subjective:     Patient ID: Leslie Hensley, female    DOB: 1946-01-11, 76 y.o.   MRN: [id number]  Chief Complaint  Patient presents with   Dysuria   Cough    Dysuria  This is a new problem. The current episode started today. The problem has been gradually worsening. The quality of the pain is described as burning. Associated symptoms include frequency, hematuria, hesitancy and urgency. Pertinent negatives include no chills, discharge, flank pain, nausea, sweats or vomiting. Her past medical history is significant for kidney stones and recurrent UTIs.  Cough This is a new problem. The current episode started in the past 7 days. The problem has been gradually worsening. The cough is Productive of sputum. Associated symptoms include headaches, nasal congestion, postnasal drip and shortness of breath (mild, occasional). Pertinent negatives include no chest pain, chills, ear congestion, ear pain, fever, heartburn, sweats or wheezing. She has tried steroid inhaler (decongestants) for the symptoms. The treatment provided no relief. Her past medical history is significant for bronchitis. There is no history of asthma, COPD, emphysema or pneumonia.    Review of Systems  Constitutional:  Negative for chills and fever.  HENT:  Positive for postnasal drip. Negative for ear pain.   Respiratory:  Positive for cough and shortness of breath (mild, occasional). Negative for wheezing.   Cardiovascular:  Negative for chest pain.  Gastrointestinal:  Negative for heartburn, nausea and vomiting.  Genitourinary:  Positive for dysuria, frequency, hematuria, hesitancy and urgency. Negative for flank pain.  Neurological:  Positive for headaches.        Objective:    BP 128/74   Pulse 94   Temp (!) 96.8 F (36 C) (Temporal)   Ht 5\' 6"  (1.676 m)   Wt 219 lb 4 oz (99.5 kg)   SpO2 95%   BMI 35.39 kg/m    Physical Exam Vitals and nursing note reviewed.  Constitutional:       General: She is not in acute distress.    Appearance: She is not ill-appearing, toxic-appearing or diaphoretic.  HENT:     Head: Normocephalic and atraumatic.     Right Ear: Tympanic membrane, ear canal and external ear normal.     Left Ear: Tympanic membrane, ear canal and external ear normal.     Nose: Congestion present.     Mouth/Throat:     Mouth: Mucous membranes are moist.     Pharynx: Oropharynx is clear.  Eyes:     General:        Right eye: No discharge.        Left eye: No discharge.     Pupils: Pupils are equal, round, and reactive to light.  Cardiovascular:     Rate and Rhythm: Normal rate and regular rhythm.     Heart sounds: Normal heart sounds. No murmur heard. Pulmonary:     Effort: Pulmonary effort is normal. No respiratory distress.     Breath sounds: Normal breath sounds. No wheezing.  Abdominal:     General: Bowel sounds are normal. There is no distension.     Palpations: Abdomen is soft.     Tenderness: There is no abdominal tenderness. There is no right CVA tenderness, left CVA tenderness, guarding or rebound.  Musculoskeletal:     Cervical back: No tenderness.     Right lower leg: No edema.     Left lower leg: No edema.  Lymphadenopathy:     Cervical: No cervical adenopathy.  Skin:    General: Skin is warm and dry.  Neurological:     General: No focal deficit present.     Mental Status: She is alert and oriented to person, place, and time.  Psychiatric:        Mood and Affect: Mood normal.        Behavior: Behavior normal.    No results found for any visits on 08/29/22.      Assessment & Plan:   Marlow was seen today for dysuria and cough.  Diagnoses and all orders for this visit:  Dysuria UA with > 30 RBC, >30 WBC, and few bacteria. Nitrate negative. Has hx of UTIs and kidney stones. No fever, chills, flank pain, or CVS tenderness. Will sent urine for culture. Start bactrim pending culture for UTI. Will also start flomax for possible kidney  stone. Strainer given. Strict return precautions.  -     Urinalysis, Routine w reflex microscopic -     Urine Culture -     tamsulosin (FLOMAX) 0.4 MG CAPS capsule; Take 1 capsule (0.4 mg total) by mouth daily. -     sulfamethoxazole-trimethoprim (BACTRIM DS) 800-160 MG tablet; Take 1 tablet by mouth 2 (two) times daily for 7 days.  History of kidney stones -     tamsulosin (FLOMAX) 0.4 MG CAPS capsule; Take 1 capsule (0.4 mg total) by mouth daily.  URI with cough and congestion Symptoms x4 days. Discussed symptomatic care for viral illness and return precautions.  -     benzonatate (TESSALON PERLES) 100 MG capsule; Take 1 capsule (100 mg total) by mouth 3 (three) times daily as needed for cough.  Return to office for new or worsening symptoms, or if symptoms persist.   The patient indicates understanding of these issues and agrees with the plan.  Gabriel Earing, FNP

## 2022-08-31 LAB — URINE CULTURE

## 2022-10-31 ENCOUNTER — Other Ambulatory Visit: Payer: Self-pay | Admitting: Family Medicine

## 2022-10-31 DIAGNOSIS — F5101 Primary insomnia: Secondary | ICD-10-CM

## 2022-10-31 DIAGNOSIS — F418 Other specified anxiety disorders: Secondary | ICD-10-CM

## 2022-12-02 ENCOUNTER — Ambulatory Visit (INDEPENDENT_AMBULATORY_CARE_PROVIDER_SITE_OTHER): Payer: Medicare Other | Admitting: Family Medicine

## 2022-12-02 ENCOUNTER — Encounter: Payer: Self-pay | Admitting: Family Medicine

## 2022-12-02 ENCOUNTER — Ambulatory Visit (INDEPENDENT_AMBULATORY_CARE_PROVIDER_SITE_OTHER): Payer: Medicare Other

## 2022-12-02 VITALS — BP 137/76 | HR 75 | Temp 97.6°F | Ht 66.0 in | Wt 219.2 lb

## 2022-12-02 DIAGNOSIS — F5101 Primary insomnia: Secondary | ICD-10-CM

## 2022-12-02 DIAGNOSIS — E034 Atrophy of thyroid (acquired): Secondary | ICD-10-CM | POA: Diagnosis not present

## 2022-12-02 DIAGNOSIS — I671 Cerebral aneurysm, nonruptured: Secondary | ICD-10-CM

## 2022-12-02 DIAGNOSIS — M810 Age-related osteoporosis without current pathological fracture: Secondary | ICD-10-CM | POA: Diagnosis not present

## 2022-12-02 DIAGNOSIS — G4762 Sleep related leg cramps: Secondary | ICD-10-CM

## 2022-12-02 DIAGNOSIS — M8588 Other specified disorders of bone density and structure, other site: Secondary | ICD-10-CM | POA: Diagnosis not present

## 2022-12-02 DIAGNOSIS — Z0001 Encounter for general adult medical examination with abnormal findings: Secondary | ICD-10-CM

## 2022-12-02 DIAGNOSIS — M8580 Other specified disorders of bone density and structure, unspecified site: Secondary | ICD-10-CM

## 2022-12-02 DIAGNOSIS — Z872 Personal history of diseases of the skin and subcutaneous tissue: Secondary | ICD-10-CM

## 2022-12-02 DIAGNOSIS — Z0000 Encounter for general adult medical examination without abnormal findings: Secondary | ICD-10-CM

## 2022-12-02 DIAGNOSIS — E785 Hyperlipidemia, unspecified: Secondary | ICD-10-CM

## 2022-12-02 DIAGNOSIS — F418 Other specified anxiety disorders: Secondary | ICD-10-CM

## 2022-12-02 DIAGNOSIS — R600 Localized edema: Secondary | ICD-10-CM

## 2022-12-02 DIAGNOSIS — I10 Essential (primary) hypertension: Secondary | ICD-10-CM

## 2022-12-02 MED ORDER — LEVOTHYROXINE SODIUM 75 MCG PO TABS: 75.0000 ug | ORAL_TABLET | Freq: Every day | ORAL | 3 refills | Status: DC

## 2022-12-02 MED ORDER — MIRTAZAPINE 15 MG PO TABS: 15.0000 mg | ORAL_TABLET | Freq: Every day | ORAL | 3 refills | Status: DC

## 2022-12-02 MED ORDER — SIMVASTATIN 20 MG PO TABS: 20.0000 mg | ORAL_TABLET | Freq: Every day | ORAL | 3 refills | Status: DC

## 2022-12-02 MED ORDER — ROPINIROLE HCL 0.25 MG PO TABS: 0.2500 mg | ORAL_TABLET | Freq: Every day | ORAL | 3 refills | Status: DC

## 2022-12-02 MED ORDER — HYDROCHLOROTHIAZIDE 25 MG PO TABS: 25.0000 mg | ORAL_TABLET | Freq: Every day | ORAL | 3 refills | Status: DC

## 2022-12-02 NOTE — Progress Notes (Signed)
Leslie Hensley is a 76 y.o. female presents to office today for annual physical exam examination.    Concerns today include: 1.  History of brain aneurysm Patient with history of brain aneurysm status post clip placement.  She is under the care of Dr. Eliberto Ivory at [facility] and needs a new referral to update her brain scan and get a checkup.  She thinks that she space ago roughly every 5 years and that should be coming up this year.  Does not report any headache, blurred vision or other concerns.  Occupation: caretaker. Substance use: None Diet: Currently nonfasting but typical American, Exercise: No structured but stays physically active caring for her sister and another elderly disabled woman Last eye exam: needs Last dental exam: UTD Last colonoscopy: UTD Last mammogram: UTD Last pap smear: n.a Refills needed today: all Immunizations needed: Immunization History  Administered Date(s) Administered   Td 06/24/2019   Tdap 06/24/2019   Zoster Recombinat (Shingrix) 01/04/2022, 03/11/2022     Past Medical History:  Diagnosis Date   Brain aneurysm    Hyperlipidemia    Hypertension    Thyroid disease    Social History   Socioeconomic History   Marital status: Divorced    Spouse name: Not on file   Number of children: 3   Years of education: 10   Highest education level: 10th grade  Occupational History   Occupation: retired  Tobacco Use   Smoking status: Former    Types: Cigarettes    Quit date: 12/21/1999    Years since quitting: 22.9   Smokeless tobacco: Never  Vaping Use   Vaping Use: Never used  Substance and Sexual Activity   Alcohol use: Not Currently   Drug use: Not Currently   Sexual activity: Not Currently    Birth control/protection: Surgical  Other Topics Concern   Not on file  Social History Narrative   Not on file   Social Determinants of Health   Financial Resource Strain: Low Risk  (01/08/2022)   Overall Financial Resource Strain (CARDIA)     Difficulty of Paying Living Expenses: Not hard at all  Food Insecurity: No Food Insecurity (01/08/2022)   Hunger Vital Sign    Worried About Running Out of Food in the Last Year: Never true    Ran Out of Food in the Last Year: Never true  Transportation Needs: No Transportation Needs (01/08/2022)   PRAPARE - Hydrologist (Medical): No    Lack of Transportation (Non-Medical): No  Physical Activity: Sufficiently Active (01/08/2022)   Exercise Vital Sign    Days of Exercise per Week: 7 days    Minutes of Exercise per Session: 30 min  Stress: No Stress Concern Present (01/08/2022)   [place]    Feeling of Stress : Not at all  Social Connections: Moderately Integrated (01/08/2022)   Social Connection and Isolation Panel [NHANES]    Frequency of Communication with Friends and Family: More than three times a week    Frequency of Social Gatherings with Friends and Family: More than three times a week    Attends Religious Services: More than 4 times per year    Active Member of [employer] or Organizations: Yes    Attends Archivist Meetings: More than 4 times per year    Marital Status: Divorced  Intimate Partner Violence: Not At Risk (01/08/2022)   Humiliation, Afraid, Rape, and Kick questionnaire  Fear of Current or Ex-Partner: No    Emotionally Abused: No    Physically Abused: No    Sexually Abused: No   Past Surgical History:  Procedure Laterality Date   ABDOMINAL HYSTERECTOMY     BRAIN SURGERY     aneurysm   FOOT SURGERY Left    spur removed   KIDNEY STONE SURGERY     Family History  Problem Relation Age of Onset   Heart disease Mother    Heart disease Father    Stroke Father    Heart disease Sister    Heart disease Brother    Diabetes Brother    Heart disease Brother    Diabetes Brother    Stroke Brother    Cancer Brother     Current Outpatient Medications:    ascorbic  acid (VITAMIN C) 500 MG tablet, Take 1 tablet (500 mg total) by mouth daily., Disp: 90 tablet, Rfl: 3   aspirin 81 MG EC tablet, Take 1 tablet (81 mg total) by mouth daily. Swallow whole., Disp: 90 tablet, Rfl: 3   benzonatate (TESSALON PERLES) 100 MG capsule, Take 1 capsule (100 mg total) by mouth 3 (three) times daily as needed for cough., Disp: 20 capsule, Rfl: 0   chlorhexidine (PERIDEX) 0.12 % solution, SMARTSIG:By Mouth, Disp: , Rfl:    Cholecalciferol (VITAMIN D3) 125 MCG (5000 UT) CAPS, Take 1 capsule (5,000 Units total) by mouth daily., Disp: 90 capsule, Rfl: 3   hydrochlorothiazide (HYDRODIURIL) 25 MG tablet, Take 1 tablet (25 mg total) by mouth daily., Disp: 90 tablet, Rfl: 3   levothyroxine (SYNTHROID) 75 MCG tablet, TAKE 1 TABLET BY MOUTH DAILY., Disp: 90 tablet, Rfl: 0   mirtazapine (REMERON) 15 MG tablet, TAKE 1 TABLET BY MOUTH AT BEDTIME, Disp: 90 tablet, Rfl: 0   Omega-3 Fatty Acids (FISH OIL) 645 MG CAPS, Take 1 capsule by mouth daily., Disp: 90 capsule, Rfl: 3   rOPINIRole (REQUIP) 0.25 MG tablet, Take 1-3 tablets (0.25-0.75 mg total) by mouth at bedtime., Disp: 270 tablet, Rfl: 3   simvastatin (ZOCOR) 20 MG tablet, Take 1 tablet (20 mg total) by mouth daily., Disp: 90 tablet, Rfl: 3   tamsulosin (FLOMAX) 0.4 MG CAPS capsule, Take 1 capsule (0.4 mg total) by mouth daily., Disp: 30 capsule, Rfl: 3   vitamin E 180 MG (400 UNITS) capsule, Take 1 capsule (400 Units total) by mouth daily., Disp: 90 capsule, Rfl: 3  Allergies  Allergen Reactions   Codeine Hives   Cyclobenzaprine Swelling   Diclofenac Sodium Rash     ROS: Review of Systems Pertinent items noted in HPI and remainder of comprehensive ROS otherwise negative.    Physical exam BP 137/76   Pulse 75   Temp 97.6 F (36.4 C)   Ht _0  (1.676 m)   Wt 219 lb 3.2 oz (99.4 kg)   SpO2 93%   BMI 35.38 kg/m  General appearance: alert, cooperative, appears stated age, and no distress Head: Normocephalic, without  obvious abnormality, atraumatic Eyes: negative findings: lids and lashes normal, conjunctivae and sclerae normal, corneas clear, and pupils equal, round, reactive to light and accomodation Ears: normal TM's and external ear canals both ears Nose: Nares normal. Septum midline. Mucosa normal. No drainage or sinus tenderness. Throat: lips, mucosa, and tongue normal; teeth and gums normal Neck: no adenopathy, supple, symmetrical, trachea midline, and thyroid not enlarged, symmetric, no tenderness/mass/nodules Back: symmetric, no curvature. ROM normal. No CVA tenderness. Lungs: clear to auscultation bilaterally Heart: regular rate  and rhythm, S1, S2 normal, no murmur, click, rub or gallop Abdomen: soft, non-tender; bowel sounds normal; no masses,  no organomegaly Extremities: extremities normal, atraumatic, no cyanosis or edema Pulses: 2+ and symmetric Skin: Skin color, texture, turgor normal. No rashes or lesions Lymph nodes: Cervical, supraclavicular, and axillary nodes normal. Neurologic: Grossly normal     12/02/2022   10:46 AM 08/29/2022   10:12 AM 05/31/2022    1:00 PM  Depression screen PHQ 2/9  Decreased Interest 0 0 0  Down, Depressed, Hopeless 0 0 0  PHQ - 2 Score 0 0 0  Altered sleeping  0   Tired, decreased energy  0   Change in appetite  0   Feeling bad or failure about yourself   0   Trouble concentrating  0   Moving slowly or fidgety/restless  0   Suicidal thoughts  0   PHQ-9 Score  0   Difficult doing work/chores  Not difficult at all       12/02/2022   10:47 AM 12/02/2022   10:46 AM 08/29/2022   10:13 AM 05/31/2022    1:00 PM  GAD 7 : Generalized Anxiety Score  Nervous, Anxious, on Edge 0 0 0 0  Control/stop worrying  0 0 0  Worry too much - different things  0 0 0  Trouble relaxing  0 0 0  Restless  0 0 0  Easily annoyed or irritable  0 0 0  Afraid - awful might happen  0 0 0  Total GAD 7 Score  0 0 0  Anxiety Difficulty  Not difficult at all Not difficult at all  Not difficult at all   Assessment/ Plan: [name] here for annual physical exam.   Annual physical exam  Primary insomnia - Plan: mirtazapine (REMERON) 15 MG tablet  Situational anxiety - Plan: mirtazapine (REMERON) 15 MG tablet  Hypothyroidism due to acquired atrophy of thyroid - Plan: levothyroxine (SYNTHROID) 75 MCG tablet, T4, Free, TSH, CANCELED: TSH, CANCELED: T4, Free  Essential hypertension - Plan: CMP14+EGFR, CANCELED: CMP14+EGFR  Hyperlipidemia, unspecified hyperlipidemia type - Plan: simvastatin (ZOCOR) 20 MG tablet, CMP14+EGFR, Lipid Panel, CANCELED: CMP14+EGFR, CANCELED: Lipid Panel  H/O discoid lupus erythematosus - Plan: CBC, CANCELED: CBC  Osteopenia, unspecified location - Plan: DG [facility] DEXA  Brain aneurysm - Plan: Ambulatory referral to Neurosurgery  Localized edema - Plan: hydrochlorothiazide (HYDRODIURIL) 25 MG tablet  Nocturnal leg cramps - Plan: rOPINIRole (REQUIP) 0.25 MG tablet  Vaccinations postpones.  DEXA scan collected today.  Had colonoscopy at [facility] so we will get that information  Referral back to Dr. Hessie Knows office for ongoing management of her aneurysm  Her other medical issues are chronic and stable.  She was nonfasting today so she deferred her labs until tomorrow.  Counseled on healthy lifestyle choices, including diet (rich in fruits, vegetables and lean meats and low in salt and simple carbohydrates) and exercise (at least 30 minutes of moderate physical activity daily).  Patient to follow up in 37m Vonda Harth M. GLajuana Ripple DO

## 2022-12-02 NOTE — Patient Instructions (Signed)
You had labs performed today.  You will be contacted with the results of the labs once they are available, usually in the next 3 business days for routine lab work.  If you have an active my chart account, they will be released to your MyChart.  If you prefer to have these labs released to you via telephone, please let us know. ? ? Bone Density Test ?A bone density test uses a type of X-ray to measure the amount of calcium and other minerals in a person's bones. It can measure bone density in the hip and the spine. The test is similar to having a regular X-ray. This test may also be called: ?Bone densitometry. ?Bone mineral density test. ?Dual-energy X-ray absorptiometry (DEXA). ?You may have this test to: ?Diagnose a condition that causes weak or thin bones (osteoporosis). ?Screen you for osteoporosis. ?Predict your risk for a broken bone (fracture). ?Determine how well your osteoporosis treatment is working. ?Tell a health care provider about: ?Any allergies you have. ?All medicines you are taking, including vitamins, herbs, eye drops, creams, and over-the-counter medicines. ?Any problems you or family members have had with anesthetic medicines. ?Any blood disorders you have. ?Any surgeries you have had. ?Any medical conditions you have. ?Whether you are pregnant or may be pregnant. ?Any medical tests you have had within the past 14 days that used contrast material. ?What are the risks? ?Generally, this is a safe test. However, it does expose you to a small amount of radiation, which can slightly increase your cancer risk. ?What happens before the test? ?Do not take any calcium supplements within the 24 hours before your test. ?You will need to remove all metal jewelry, eyeglasses, removable dental appliances, and any other metal objects on your body. ?What happens during the test? ? ?You will lie down on an exam table. There will be an X-ray generator below you and an imaging device above you. ?Other devices,  such as boxes or braces, may be used to position your body properly for the scan. ?The machine will slowly scan your body. You will need to keep very still while the machine does the scan. ?The images will show up on a screen in the room. Images will be examined by a specialist after your test is finished. ?The procedure may vary among health care providers and hospitals. ?What can I expect after the test? ?It is up to you to get the results of your test. Ask your health care provider, or the department that is doing the test, when your results will be ready. ?Summary ?A bone density test is an imaging test that uses a type of X-ray to measure the amount of calcium and other minerals in your bones. ?The test may be used to diagnose or screen you for a condition that causes weak or thin bones (osteoporosis), predict your risk for a broken bone (fracture), or determine how well your osteoporosis treatment is working. ?Do not take any calcium supplements within 24 hours before your test. ?Ask your health care provider, or the department that is doing the test, when your results will be ready. ?This information is not intended to replace advice given to you by your health care provider. Make sure you discuss any questions you have with your health care provider. ?Document Revised: 08/29/2021 Document Reviewed: 06/01/2020 ?Elsevier Patient Education ? 2023 [employer] ? ?

## 2022-12-03 ENCOUNTER — Other Ambulatory Visit: Payer: Medicare Other

## 2022-12-03 DIAGNOSIS — E785 Hyperlipidemia, unspecified: Secondary | ICD-10-CM | POA: Diagnosis not present

## 2022-12-03 DIAGNOSIS — I10 Essential (primary) hypertension: Secondary | ICD-10-CM | POA: Diagnosis not present

## 2022-12-03 DIAGNOSIS — Z872 Personal history of diseases of the skin and subcutaneous tissue: Secondary | ICD-10-CM | POA: Diagnosis not present

## 2022-12-03 DIAGNOSIS — M8580 Other specified disorders of bone density and structure, unspecified site: Secondary | ICD-10-CM | POA: Diagnosis not present

## 2022-12-03 DIAGNOSIS — E034 Atrophy of thyroid (acquired): Secondary | ICD-10-CM | POA: Diagnosis not present

## 2022-12-04 LAB — CBC
Hematocrit: 41.7 % (ref 34.0–46.6)
Hemoglobin: 13.7 g/dL (ref 11.1–15.9)
MCH: 30.6 pg (ref 26.6–33.0)
MCHC: 32.9 g/dL (ref 31.5–35.7)
MCV: 93 fL (ref 79–97)
Platelets: 254 10*3/uL (ref 150–450)
RBC: 4.48 x10E6/uL (ref 3.77–5.28)
RDW: 13 % (ref 11.7–15.4)
WBC: 5.6 10*3/uL (ref 3.4–10.8)

## 2022-12-04 LAB — CMP14+EGFR
ALT: 14 IU/L (ref 0–32)
AST: 16 IU/L (ref 0–40)
Albumin/Globulin Ratio: 1.3 (ref 1.2–2.2)
Albumin: 4.1 g/dL (ref 3.8–4.8)
Alkaline Phosphatase: 96 IU/L (ref 44–121)
BUN/Creatinine Ratio: 12 (ref 12–28)
BUN: 9 mg/dL (ref 8–27)
Bilirubin Total: 0.4 mg/dL (ref 0.0–1.2)
CO2: 25 mmol/L (ref 20–29)
Calcium: 9.9 mg/dL (ref 8.7–10.3)
Chloride: 107 mmol/L — ABNORMAL HIGH (ref 96–106)
Creatinine, Ser: 0.74 mg/dL (ref 0.57–1.00)
Globulin, Total: 3.2 g/dL (ref 1.5–4.5)
Glucose: 102 mg/dL — ABNORMAL HIGH (ref 70–99)
Potassium: 4.1 mmol/L (ref 3.5–5.2)
Sodium: 144 mmol/L (ref 134–144)
Total Protein: 7.3 g/dL (ref 6.0–8.5)
eGFR: 84 mL/min/{1.73_m2} (ref >59)

## 2022-12-04 LAB — LIPID PANEL
Chol/HDL Ratio: 3.1 ratio (ref 0.0–4.4)
Cholesterol, Total: 157 mg/dL (ref 100–199)
HDL: 50 mg/dL (ref >39)
LDL Chol Calc (NIH): 84 mg/dL (ref 0–99)
Triglycerides: 133 mg/dL (ref 0–149)
VLDL Cholesterol Cal: 23 mg/dL (ref 5–40)

## 2022-12-04 LAB — T4, FREE: Free T4: 1.05 ng/dL (ref 0.82–1.77)

## 2022-12-04 LAB — TSH: TSH: 2.02 u[IU]/mL (ref 0.450–4.500)

## 2022-12-05 DIAGNOSIS — Z1231 Encounter for screening mammogram for malignant neoplasm of breast: Secondary | ICD-10-CM | POA: Diagnosis not present

## 2022-12-05 LAB — VITAMIN D 25 HYDROXY (VIT D DEFICIENCY, FRACTURES): Vit D, 25-Hydroxy: 60.7 ng/mL (ref 30.0–100.0)

## 2022-12-05 LAB — SPECIMEN STATUS REPORT

## 2022-12-17 ENCOUNTER — Encounter: Payer: Self-pay | Admitting: Family Medicine

## 2023-01-01 DIAGNOSIS — Z87820 Personal history of traumatic brain injury: Secondary | ICD-10-CM | POA: Diagnosis not present

## 2023-01-01 DIAGNOSIS — Z8679 Personal history of other diseases of the circulatory system: Secondary | ICD-10-CM | POA: Diagnosis not present

## 2023-01-01 DIAGNOSIS — I671 Cerebral aneurysm, nonruptured: Secondary | ICD-10-CM | POA: Diagnosis not present

## 2023-01-01 DIAGNOSIS — Z95828 Presence of other vascular implants and grafts: Secondary | ICD-10-CM | POA: Diagnosis not present

## 2023-01-08 DIAGNOSIS — K080 Exfoliation of teeth due to systemic causes: Secondary | ICD-10-CM | POA: Diagnosis not present

## 2023-01-09 ENCOUNTER — Ambulatory Visit (INDEPENDENT_AMBULATORY_CARE_PROVIDER_SITE_OTHER): Payer: Medicare Other

## 2023-01-09 VITALS — Ht 65.0 in | Wt 215.0 lb

## 2023-01-09 DIAGNOSIS — Z0000 Encounter for general adult medical examination without abnormal findings: Secondary | ICD-10-CM

## 2023-01-09 DIAGNOSIS — Z0100 Encounter for examination of eyes and vision without abnormal findings: Secondary | ICD-10-CM

## 2023-01-09 NOTE — Progress Notes (Signed)
Subjective:   Leslie Hensley is a 77 y.o. female who presents for Medicare Annual (Subsequent) preventive examination. I connected with  Leslie Hensley on 01/09/23 by a audio enabled telemedicine application and verified that I am speaking with the correct person using two identifiers.  Patient Location: Home  Provider Location: Home Office  I discussed the limitations of evaluation and management by telemedicine. The patient expressed understanding and agreed to proceed.  Review of Systems     Cardiac Risk Factors include: advanced age (>76men, >27 women);hypertension     Objective:    Today's Vitals   01/09/23 1113  Weight: 215 lb (97.5 kg)  Height: 5\' 5"  (1.651 m)   Body mass index is 35.78 kg/m.     01/09/2023   11:18 AM 01/08/2022   10:39 AM 01/04/2021   10:11 AM 07/10/2020   12:37 PM 12/21/2019    9:28 AM  Advanced Directives  Does Patient Have a Medical Advance Directive? Yes No No Yes No  Type of Paramedic of [place];Living will      Copy of [place] in Chart? No - copy requested      Would patient like information on creating a medical advance directive?  No - Patient declined No - Patient declined  No - Patient declined    Current Medications (verified) Outpatient Encounter Medications as of 01/09/2023  Medication Sig   ascorbic acid (VITAMIN C) 500 MG tablet Take 1 tablet (500 mg total) by mouth daily.   aspirin 81 MG EC tablet Take 1 tablet (81 mg total) by mouth daily. Swallow whole.   Cholecalciferol (VITAMIN D3) 125 MCG (5000 UT) CAPS Take 1 capsule (5,000 Units total) by mouth daily.   hydrochlorothiazide (HYDRODIURIL) 25 MG tablet Take 1 tablet (25 mg total) by mouth daily.   levothyroxine (SYNTHROID) 75 MCG tablet Take 1 tablet (75 mcg total) by mouth daily.   mirtazapine (REMERON) 15 MG tablet Take 1 tablet (15 mg total) by mouth at bedtime.   Omega-3 Fatty Acids (FISH OIL) 645 MG CAPS Take 1 capsule by  mouth daily.   rOPINIRole (REQUIP) 0.25 MG tablet Take 1-3 tablets (0.25-0.75 mg total) by mouth at bedtime.   simvastatin (ZOCOR) 20 MG tablet Take 1 tablet (20 mg total) by mouth daily.   vitamin E 180 MG (400 UNITS) capsule Take 1 capsule (400 Units total) by mouth daily.   No facility-administered encounter medications on file as of 01/09/2023.    Allergies (verified) Codeine, Cyclobenzaprine, and Diclofenac sodium   History: Past Medical History:  Diagnosis Date   Brain aneurysm    Hyperlipidemia    Hypertension    Thyroid disease    Past Surgical History:  Procedure Laterality Date   ABDOMINAL HYSTERECTOMY     BRAIN SURGERY     aneurysm   FOOT SURGERY Left    spur removed   KIDNEY STONE SURGERY     Family History  Problem Relation Age of Onset   Heart disease Mother    Heart disease Father    Stroke Father    Heart disease Sister    Heart disease Brother    Diabetes Brother    Heart disease Brother    Diabetes Brother    Stroke Brother    Cancer Brother    Social History   Socioeconomic History   Marital status: Divorced    Spouse name: Not on file   Number of children: 3   Years  of education: 10   Highest education level: 10th grade  Occupational History   Occupation: retired  Tobacco Use   Smoking status: Former    Types: Cigarettes    Quit date: 12/21/1999    Years since quitting: 23.0   Smokeless tobacco: Never  Vaping Use   Vaping Use: Never used  Substance and Sexual Activity   Alcohol use: Not Currently   Drug use: Not Currently   Sexual activity: Not Currently    Birth control/protection: Surgical  Other Topics Concern   Not on file  Social History Narrative   Not on file   Social Determinants of Health   Financial Resource Strain: Low Risk  (01/09/2023)   Overall Financial Resource Strain (CARDIA)    Difficulty of Paying Living Expenses: Not hard at all  Food Insecurity: No Food Insecurity (01/09/2023)   Hunger Vital Sign     Worried About Running Out of Food in the Last Year: Never true    Ran Out of Food in the Last Year: Never true  Transportation Needs: No Transportation Needs (01/09/2023)   PRAPARE - Administrator, Civil Service (Medical): No    Lack of Transportation (Non-Medical): No  Physical Activity: Insufficiently Active (01/09/2023)   Exercise Vital Sign    Days of Exercise per Week: 3 days    Minutes of Exercise per Session: 30 min  Stress: No Stress Concern Present (01/09/2023)   [employer] of Occupational Health - Occupational Stress Questionnaire    Feeling of Stress : Not at all  Social Connections: Moderately Isolated (01/09/2023)   Social Connection and Isolation Panel [NHANES]    Frequency of Communication with Friends and Family: More than three times a week    Frequency of Social Gatherings with Friends and Family: More than three times a week    Attends Religious Services: More than 4 times per year    Active Member of [employer] or Organizations: No    Attends Engineer, structural: Never    Marital Status: Divorced    Tobacco Counseling Counseling given: Not Answered   Clinical Intake:  Pre-visit preparation completed: Yes  Pain : No/denies pain     Nutritional Risks: None Diabetes: No  How often do you need to have someone help you when you read instructions, pamphlets, or other written materials from your doctor or pharmacy?: 1 - Never  Diabetic?no   Interpreter Needed?: No  Information entered by :: Renie Ora, LPN   Activities of Daily Living    01/09/2023   11:18 AM  In your present state of health, do you have any difficulty performing the following activities:  Hearing? 0  Vision? 0  Difficulty concentrating or making decisions? 0  Walking or climbing stairs? 0  Dressing or bathing? 0  Doing errands, shopping? 0  Preparing Food and eating ? N  Using the Toilet? N  In the past six months, have you accidently leaked urine? N  Do  you have problems with loss of bowel control? N  Managing your Medications? N  Managing your Finances? N  Housekeeping or managing your Housekeeping? N    Patient Care Team: Raliegh Ip, DO as PCP - General (Family Medicine)  Indicate any recent Medical Services you may have received from other than [facility] providers in the past year (date may be approximate).     Assessment:   This is a routine wellness examination for Forrest.  Hearing/Vision screen Vision Screening - Comments:: Referral to  eye appointment 01/09/2023  Dietary issues and exercise activities discussed: Current Exercise Habits: Home exercise routine, Type of exercise: walking, Time (Minutes): 30, Frequency (Times/Week): 3, Weekly Exercise (Minutes/Week): 90, Intensity: Mild, Exercise limited by: None identified   Goals Addressed             This Visit's Progress    DIET - EAT MORE FRUITS AND VEGETABLES   On track      Depression Screen    01/09/2023   11:17 AM 12/02/2022   10:46 AM 08/29/2022   10:12 AM 05/31/2022    1:00 PM 05/08/2022    8:11 AM 02/20/2022    3:58 PM 01/08/2022   10:37 AM  PHQ 2/9 Scores  PHQ - 2 Score 0 0 0 0 0 0 1  PHQ- 9 Score   0   0 1    Fall Risk    01/09/2023   11:14 AM 12/02/2022   10:46 AM 08/29/2022   10:12 AM 05/31/2022    1:00 PM 02/20/2022    3:58 PM  Fall Risk   Falls in the past year? 0 0 0 0 0  Number falls in past yr: 0      Injury with Fall? 0      Risk for fall due to : No Fall Risks      Follow up Falls prevention discussed        FALL RISK PREVENTION PERTAINING TO THE HOME:  Any stairs in or around the home? Yes  If so, are there any without handrails? No  Home free of loose throw rugs in walkways, pet beds, electrical cords, etc? Yes  Adequate lighting in your home to reduce risk of falls? Yes   ASSISTIVE DEVICES UTILIZED TO PREVENT FALLS:  Life alert? No  Use of a cane, walker or w/c? No  Grab bars in the bathroom? Yes  Shower chair or bench in  shower? Yes  Elevated toilet seat or a handicapped toilet? Yes          01/09/2023   11:18 AM 01/04/2021   10:14 AM 12/21/2019    9:32 AM  6CIT Screen  What Year? 0 points 0 points 0 points  What month? 0 points 0 points 0 points  What time? 0 points 0 points 0 points  Count back from 20 0 points 0 points 0 points  Months in reverse 0 points 2 points 2 points  Repeat phrase 0 points 0 points 0 points  Total Score 0 points 2 points 2 points    Immunizations Immunization History  Administered Date(s) Administered   Td 06/24/2019   Tdap 06/24/2019   Zoster Recombinat (Shingrix) 01/04/2022, 03/11/2022    TDAP status: Up to date  Flu Vaccine status: Declined, Education has been provided regarding the importance of this vaccine but patient still declined. Advised may receive this vaccine at local pharmacy or Health Dept. Aware to provide a copy of the vaccination record if obtained from local pharmacy or Health Dept. Verbalized acceptance and understanding.  Pneumococcal vaccine status: Declined,  Education has been provided regarding the importance of this vaccine but patient still declined. Advised may receive this vaccine at local pharmacy or Health Dept. Aware to provide a copy of the vaccination record if obtained from local pharmacy or Health Dept. Verbalized acceptance and understanding.   Covid-19 vaccine status: Information provided on how to obtain vaccines.   Qualifies for Shingles Vaccine? Yes   Zostavax completed Yes   Shingrix Completed?: Yes  Screening Tests Health Maintenance  Topic Date Due   COVID-19 Vaccine (1) Never done   INFLUENZA VACCINE  03/30/2023 (Originally 07/30/2022)   Pneumonia Vaccine 40+ Years old (1 - PCV) 12/03/2023 (Originally 07/07/2012)   COLONOSCOPY (Pts 45-78yrs Insurance coverage will need to be confirmed)  12/03/2023 (Originally 07/07/1992)   Hepatitis C Screening  12/03/2023 (Originally [birth year]1965)   MAMMOGRAM  12/06/2023   Medicare Annual  Wellness (AWV)  01/10/2024   DEXA SCAN  12/02/2024   DTaP/Tdap/Td (3 - Td or Tdap) 06/23/2029   Zoster Vaccines- Shingrix  Completed   HPV VACCINES  Aged Out    Health Maintenance  Health Maintenance Due  Topic Date Due   COVID-19 Vaccine (1) Never done    Colorectal cancer screening: No longer required.   Mammogram status: No longer required due to age.  Bone Density status: Completed 12/02/2022. Results reflect: Bone density results: OSTEOPOROSIS. Repeat every 2 years.  Lung Cancer Screening: (Low Dose CT Chest recommended if Age 76-80 years, 30 pack-year currently smoking OR have quit w/in 15years.) does not qualify.   Lung Cancer Screening Referral: n/a  Additional Screening:  Hepatitis C Screening: does not qualify;   Vision Screening: Recommended annual ophthalmology exams for early detection of glaucoma and other disorders of the eye. Is the patient up to date with their annual eye exam?  No  Who is the provider or what is the name of the office in which the patient attends annual eye exams? None , referral 01/09/2023 If pt is not established with a provider, would they like to be referred to a provider to establish care? No .   Dental Screening: Recommended annual dental exams for proper oral hygiene  Community Resource Referral / Chronic Care Management: CRR required this visit?  No   CCM required this visit?  No      Plan:     I have personally reviewed and noted the following in the patient's chart:   Medical and social history Use of alcohol, tobacco or illicit drugs  Current medications and supplements including opioid prescriptions. Patient is not currently taking opioid prescriptions. Functional ability and status Nutritional status Physical activity Advanced directives List of other physicians Hospitalizations, surgeries, and ER visits in previous 12 months Vitals Screenings to include cognitive, depression, and falls Referrals and  appointments  In addition, I have reviewed and discussed with patient certain preventive protocols, quality metrics, and best practice recommendations. A written personalized care plan for preventive services as well as general preventive health recommendations were provided to patient.     Daphane Shepherd, LPN   05/27/4131   Nurse Notes: None

## 2023-01-09 NOTE — Patient Instructions (Signed)
Leslie Hensley , Thank you for taking time to come for your Medicare Wellness Visit. I appreciate your ongoing commitment to your health goals. Please review the following plan we discussed and let me know if I can assist you in the future.   These are the goals we discussed:  Goals      DIET - EAT MORE FRUITS AND VEGETABLES     DIET - INCREASE WATER INTAKE     Try to drink 6-8 glasses of water daily        This is a list of the screening recommended for you and due dates:  Health Maintenance  Topic Date Due   COVID-19 Vaccine (1) Never done   Flu Shot  03/30/2023*   Pneumonia Vaccine (1 - PCV) 12/03/2023*   Colon Cancer Screening  12/03/2023*   Hepatitis C Screening: USPSTF Recommendation to screen - Ages 18-79 yo.  12/03/2023*   Mammogram  12/06/2023   Medicare Annual Wellness Visit  01/10/2024   DEXA scan (bone density measurement)  12/02/2024   DTaP/Tdap/Td vaccine (3 - Td or Tdap) 06/23/2029   Zoster (Shingles) Vaccine  Completed   HPV Vaccine  Aged Out  *Topic was postponed. The date shown is not the original due date.    Advanced directives: Please bring a copy of your health care power of attorney and living will to the office to be added to your chart at your convenience.   Conditions/risks identified: Aim for 30 minutes of exercise or brisk walking, 6-8 glasses of water, and 5 servings of fruits and vegetables each day.   Next appointment: Follow up in one year for your annual wellness visit    Preventive Care 65 Years and Older, Female Preventive care refers to lifestyle choices and visits with your health care provider that can promote health and wellness. What does preventive care include? A yearly physical exam. This is also called an annual well check. Dental exams once or twice a year. Routine eye exams. Ask your health care provider how often you should have your eyes checked. Personal lifestyle choices, including: Daily care of your teeth and gums. Regular  physical activity. Eating a healthy diet. Avoiding tobacco and drug use. Limiting alcohol use. Practicing safe sex. Taking low-dose aspirin every day. Taking vitamin and mineral supplements as recommended by your health care provider. What happens during an annual well check? The services and screenings done by your health care provider during your annual well check will depend on your age, overall health, lifestyle risk factors, and family history of disease. Counseling  Your health care provider may ask you questions about your: Alcohol use. Tobacco use. Drug use. Emotional well-being. Home and relationship well-being. Sexual activity. Eating habits. History of falls. Memory and ability to understand (cognition). Work and work Statistician. Reproductive health. Screening  You may have the following tests or measurements: Height, weight, and BMI. Blood pressure. Lipid and cholesterol levels. These may be checked every 5 years, or more frequently if you are over 30 years old. Skin check. Lung cancer screening. You may have this screening every year starting at age 59 if you have a 30-pack-year history of smoking and currently smoke or have quit within the past 15 years. Fecal occult blood test (FOBT) of the stool. You may have this test every year starting at age 81. Flexible sigmoidoscopy or colonoscopy. You may have a sigmoidoscopy every 5 years or a colonoscopy every 10 years starting at age 2. Hepatitis C blood test. Hepatitis  B blood test. Sexually transmitted disease (STD) testing. Diabetes screening. This is done by checking your blood sugar (glucose) after you have not eaten for a while (fasting). You may have this done every 1-3 years. Bone density scan. This is done to screen for osteoporosis. You may have this done starting at age 58. Mammogram. This may be done every 1-2 years. Talk to your health care provider about how often you should have regular mammograms. Talk  with your health care provider about your test results, treatment options, and if necessary, the need for more tests. Vaccines  Your health care provider may recommend certain vaccines, such as: Influenza vaccine. This is recommended every year. Tetanus, diphtheria, and acellular pertussis (Tdap, Td) vaccine. You may need a Td booster every 10 years. Zoster vaccine. You may need this after age 50. Pneumococcal 13-valent conjugate (PCV13) vaccine. One dose is recommended after age 35. Pneumococcal polysaccharide (PPSV23) vaccine. One dose is recommended after age 70. Talk to your health care provider about which screenings and vaccines you need and how often you need them. This information is not intended to replace advice given to you by your health care provider. Make sure you discuss any questions you have with your health care provider. Document Released: 01/12/2016 Document Revised: 09/04/2016 Document Reviewed: 10/17/2015 Elsevier Interactive Patient Education  2017 [place] Prevention in the Home Falls can cause injuries. They can happen to people of all ages. There are many things you can do to make your home safe and to help prevent falls. What can I do on the outside of my home? Regularly fix the edges of walkways and driveways and fix any cracks. Remove anything that might make you trip as you walk through a door, such as a raised step or threshold. Trim any bushes or trees on the path to your home. Use bright outdoor lighting. Clear any walking paths of anything that might make someone trip, such as rocks or tools. Regularly check to see if handrails are loose or broken. Make sure that both sides of any steps have handrails. Any raised decks and porches should have guardrails on the edges. Have any leaves, snow, or ice cleared regularly. Use sand or salt on walking paths during winter. Clean up any spills in your garage right away. This includes oil or grease  spills. What can I do in the bathroom? Use night lights. Install grab bars by the toilet and in the tub and shower. Do not use towel bars as grab bars. Use non-skid mats or decals in the tub or shower. If you need to sit down in the shower, use a plastic, non-slip stool. Keep the floor dry. Clean up any water that spills on the floor as soon as it happens. Remove soap buildup in the tub or shower regularly. Attach bath mats securely with double-sided non-slip rug tape. Do not have throw rugs and other things on the floor that can make you trip. What can I do in the bedroom? Use night lights. Make sure that you have a light by your bed that is easy to reach. Do not use any sheets or blankets that are too big for your bed. They should not hang down onto the floor. Have a firm chair that has side arms. You can use this for support while you get dressed. Do not have throw rugs and other things on the floor that can make you trip. What can I do in the kitchen? Clean up any  spills right away. Avoid walking on wet floors. Keep items that you use a lot in easy-to-reach places. If you need to reach something above you, use a strong step stool that has a grab bar. Keep electrical cords out of the way. Do not use floor polish or wax that makes floors slippery. If you must use wax, use non-skid floor wax. Do not have throw rugs and other things on the floor that can make you trip. What can I do with my stairs? Do not leave any items on the stairs. Make sure that there are handrails on both sides of the stairs and use them. Fix handrails that are broken or loose. Make sure that handrails are as long as the stairways. Check any carpeting to make sure that it is firmly attached to the stairs. Fix any carpet that is loose or worn. Avoid having throw rugs at the top or bottom of the stairs. If you do have throw rugs, attach them to the floor with carpet tape. Make sure that you have a light switch at the  top of the stairs and the bottom of the stairs. If you do not have them, ask someone to add them for you. What else can I do to help prevent falls? Wear shoes that: Do not have high heels. Have rubber bottoms. Are comfortable and fit you well. Are closed at the toe. Do not wear sandals. If you use a stepladder: Make sure that it is fully opened. Do not climb a closed stepladder. Make sure that both sides of the stepladder are locked into place. Ask someone to hold it for you, if possible. Clearly mark and make sure that you can see: Any grab bars or handrails. First and last steps. Where the edge of each step is. Use tools that help you move around (mobility aids) if they are needed. These include: Canes. Walkers. Scooters. Crutches. Turn on the lights when you go into a dark area. Replace any light bulbs as soon as they burn out. Set up your furniture so you have a clear path. Avoid moving your furniture around. If any of your floors are uneven, fix them. If there are any pets around you, be aware of where they are. Review your medicines with your doctor. Some medicines can make you feel dizzy. This can increase your chance of falling. Ask your doctor what other things that you can do to help prevent falls. This information is not intended to replace advice given to you by your health care provider. Make sure you discuss any questions you have with your health care provider. Document Released: 10/12/2009 Document Revised: 05/23/2016 Document Reviewed: 01/20/2015 Elsevier Interactive Patient Education  2017 [employer].

## 2023-01-17 DIAGNOSIS — D123 Benign neoplasm of transverse colon: Secondary | ICD-10-CM | POA: Diagnosis not present

## 2023-01-17 DIAGNOSIS — Z800 Family history of malignant neoplasm of digestive organs: Secondary | ICD-10-CM | POA: Diagnosis not present

## 2023-01-17 DIAGNOSIS — D122 Benign neoplasm of ascending colon: Secondary | ICD-10-CM | POA: Diagnosis not present

## 2023-01-17 DIAGNOSIS — K5730 Diverticulosis of large intestine without perforation or abscess without bleeding: Secondary | ICD-10-CM | POA: Diagnosis not present

## 2023-01-17 DIAGNOSIS — K635 Polyp of colon: Secondary | ICD-10-CM | POA: Diagnosis not present

## 2023-01-17 DIAGNOSIS — Z1211 Encounter for screening for malignant neoplasm of colon: Secondary | ICD-10-CM | POA: Diagnosis not present

## 2023-01-17 DIAGNOSIS — D125 Benign neoplasm of sigmoid colon: Secondary | ICD-10-CM | POA: Diagnosis not present

## 2023-01-17 DIAGNOSIS — D120 Benign neoplasm of cecum: Secondary | ICD-10-CM | POA: Diagnosis not present

## 2023-01-17 DIAGNOSIS — K6389 Other specified diseases of intestine: Secondary | ICD-10-CM | POA: Diagnosis not present

## 2023-01-23 ENCOUNTER — Other Ambulatory Visit: Payer: Self-pay | Admitting: Family Medicine

## 2023-01-23 DIAGNOSIS — F5101 Primary insomnia: Secondary | ICD-10-CM

## 2023-01-23 DIAGNOSIS — R600 Localized edema: Secondary | ICD-10-CM

## 2023-01-23 DIAGNOSIS — E034 Atrophy of thyroid (acquired): Secondary | ICD-10-CM

## 2023-01-23 DIAGNOSIS — E785 Hyperlipidemia, unspecified: Secondary | ICD-10-CM

## 2023-01-23 DIAGNOSIS — F418 Other specified anxiety disorders: Secondary | ICD-10-CM

## 2023-01-28 ENCOUNTER — Telehealth: Payer: Self-pay | Admitting: Family Medicine

## 2023-01-28 NOTE — Telephone Encounter (Signed)
Patient aware

## 2023-01-28 NOTE — Telephone Encounter (Signed)
Nothing has been sent to me yet.

## 2023-01-31 DIAGNOSIS — I671 Cerebral aneurysm, nonruptured: Secondary | ICD-10-CM | POA: Diagnosis not present

## 2023-01-31 DIAGNOSIS — Z9889 Other specified postprocedural states: Secondary | ICD-10-CM | POA: Diagnosis not present

## 2023-02-07 ENCOUNTER — Telehealth (INDEPENDENT_AMBULATORY_CARE_PROVIDER_SITE_OTHER): Payer: Medicare Other | Admitting: Family Medicine

## 2023-02-07 ENCOUNTER — Encounter: Payer: Self-pay | Admitting: Family Medicine

## 2023-02-07 DIAGNOSIS — R300 Dysuria: Secondary | ICD-10-CM | POA: Diagnosis not present

## 2023-02-07 DIAGNOSIS — B3741 Candidal cystitis and urethritis: Secondary | ICD-10-CM

## 2023-02-07 LAB — MICROSCOPIC EXAMINATION
Epithelial Cells (non renal): NONE SEEN /hpf (ref 0–10)
Renal Epithel, UA: NONE SEEN /hpf
WBC, UA: >30 /hpf — AB (ref 0–5)

## 2023-02-07 LAB — URINALYSIS, ROUTINE W REFLEX MICROSCOPIC
Bilirubin, UA: NEGATIVE
Glucose, UA: NEGATIVE
Ketones, UA: NEGATIVE
Nitrite, UA: NEGATIVE
Specific Gravity, UA: 1.015 (ref 1.005–1.030)
Urobilinogen, Ur: 0.2 mg/dL (ref 0.2–1.0)
pH, UA: 7 (ref 5.0–7.5)

## 2023-02-07 MED ORDER — FLUCONAZOLE 150 MG PO TABS: ORAL_TABLET | ORAL | 0 refills | Status: DC

## 2023-02-07 MED ORDER — CEPHALEXIN 500 MG PO CAPS: 500.0000 mg | ORAL_CAPSULE | Freq: Two times a day (BID) | ORAL | 0 refills | Status: DC

## 2023-02-07 NOTE — Progress Notes (Signed)
Virtual Visit  Note Due to COVID-19 pandemic this visit was conducted virtually. This visit type was conducted due to national recommendations for restrictions regarding the COVID-19 Pandemic (e.g. social distancing, sheltering in place) in an effort to limit this patient's exposure and mitigate transmission in our community. All issues noted in this document were discussed and addressed.  A physical exam was not performed with this format.  I connected with Leslie Hensley on 02/07/23 at 0957 by telephone and verified that I am speaking with the correct person using two identifiers. Leslie Hensley is currently located at home and no one is currently with her during the visit. The provider, Gwenlyn Perking, FNP is located in their office at time of visit.  I discussed the limitations, risks, security and privacy concerns of performing an evaluation and management service by telephone and the availability of in person appointments. I also discussed with the patient that there may be a patient responsible charge related to this service. The patient expressed understanding and agreed to proceed.  Unable to connect via video. Telephone audio only.   History and Present Illness:  Urinary Tract Infection  This is a new problem. The current episode started in the past 7 days. The problem occurs every urination. The problem has been unchanged. The quality of the pain is described as burning. There has been no fever. Associated symptoms include frequency and urgency. Pertinent negatives include no chills, discharge, flank pain, hematuria, hesitancy, nausea, possible pregnancy, sweats or vomiting. She has tried increased fluids (AZO) for the symptoms. The treatment provided no relief. Her past medical history is significant for recurrent UTIs.      Review of Systems  Constitutional:  Negative for chills.  Gastrointestinal:  Negative for nausea and vomiting.  Genitourinary:  Positive for frequency and  urgency. Negative for flank pain, hematuria and hesitancy.     Observations/Objective: Alert and oriented x 3. Able to speak in full sentences without difficulty.   Urine dipstick shows positive for RBC's, positive for protein, and positive for leukocytes.  Micro exam: >30 WBC's per HPF, 0-2 RBC's per HPF, and many+ bacteria, yeast present.   Assessment and Plan: Diagnoses and all orders for this visit:  Dysuria Micro with many bacteria, >30 WBC. Will treat with keflex pending urine culture.  -     Urinalysis, Routine w reflex microscopic -     Urine Culture -     cephALEXin (KEFLEX) 500 MG capsule; Take 1 capsule (500 mg total) by mouth 2 (two) times daily.  Yeast cystitis Dicflucan as below.  -     fluconazole (DIFLUCAN) 150 MG tablet; Take 1 tablet by mouth now. Repeat in 1 week.   Follow Up Instructions: Return to office for new or worsening symptoms, or if symptoms persist.     I discussed the assessment and treatment plan with the patient. The patient was provided an opportunity to ask questions and all were answered. The patient agreed with the plan and demonstrated an understanding of the instructions.   The patient was advised to call back or seek an in-person evaluation if the symptoms worsen or if the condition fails to improve as anticipated.  The above assessment and management plan was discussed with the patient. The patient verbalized understanding of and has agreed to the management plan. Patient is aware to call the clinic if symptoms persist or worsen. Patient is aware when to return to the clinic for a follow-up visit. Patient educated on  when it is appropriate to go to the emergency department.   Time call ended:  1009  I provided 12 minutes of  non face-to-face time during this encounter.    Gwenlyn Perking, FNP

## 2023-02-12 LAB — URINE CULTURE

## 2023-02-19 ENCOUNTER — Telehealth: Payer: Self-pay | Admitting: Family Medicine

## 2023-02-19 ENCOUNTER — Other Ambulatory Visit: Payer: Self-pay

## 2023-02-19 DIAGNOSIS — R600 Localized edema: Secondary | ICD-10-CM

## 2023-02-19 DIAGNOSIS — F418 Other specified anxiety disorders: Secondary | ICD-10-CM

## 2023-02-19 DIAGNOSIS — E034 Atrophy of thyroid (acquired): Secondary | ICD-10-CM

## 2023-02-19 DIAGNOSIS — G4762 Sleep related leg cramps: Secondary | ICD-10-CM

## 2023-02-19 DIAGNOSIS — E785 Hyperlipidemia, unspecified: Secondary | ICD-10-CM

## 2023-02-19 DIAGNOSIS — F5101 Primary insomnia: Secondary | ICD-10-CM

## 2023-02-19 MED ORDER — SIMVASTATIN 20 MG PO TABS: 20.0000 mg | ORAL_TABLET | Freq: Every day | ORAL | 3 refills | Status: DC

## 2023-02-19 MED ORDER — ROPINIROLE HCL 0.25 MG PO TABS: 0.2500 mg | ORAL_TABLET | Freq: Every day | ORAL | 3 refills | Status: DC

## 2023-02-19 MED ORDER — MIRTAZAPINE 15 MG PO TABS: 15.0000 mg | ORAL_TABLET | Freq: Every day | ORAL | 3 refills | Status: DC

## 2023-02-19 MED ORDER — HYDROCHLOROTHIAZIDE 25 MG PO TABS: 25.0000 mg | ORAL_TABLET | Freq: Every day | ORAL | 3 refills | Status: DC

## 2023-02-19 MED ORDER — LEVOTHYROXINE SODIUM 75 MCG PO TABS: 75.0000 ug | ORAL_TABLET | Freq: Every day | ORAL | 3 refills | Status: DC

## 2023-02-19 NOTE — Telephone Encounter (Signed)
done 

## 2023-03-17 ENCOUNTER — Telehealth: Payer: Self-pay | Admitting: Family Medicine

## 2023-03-17 NOTE — Telephone Encounter (Signed)
Mail box full at this time

## 2023-03-17 NOTE — Telephone Encounter (Signed)
I'm really indifferent to fish oil in this pt.  Her cholesterol was normal last check.  Unless she was already taking some at the time of collection, she likely does not need fish oil.  Better to eat balanced diet containing lean meat like fish.

## 2023-03-19 NOTE — Telephone Encounter (Signed)
Pt r/c.

## 2023-03-20 NOTE — Telephone Encounter (Signed)
Patient came by about message she left on fish oil. I read the message Dr. Darnell Level put in and she states she was taking fish oil when she had labs. Has questions for nurse.

## 2023-03-31 ENCOUNTER — Encounter: Payer: Self-pay | Admitting: Family

## 2023-03-31 ENCOUNTER — Telehealth (INDEPENDENT_AMBULATORY_CARE_PROVIDER_SITE_OTHER): Payer: Medicare Other | Admitting: Family

## 2023-03-31 DIAGNOSIS — R399 Unspecified symptoms and signs involving the genitourinary system: Secondary | ICD-10-CM | POA: Diagnosis not present

## 2023-03-31 MED ORDER — CEPHALEXIN 500 MG PO CAPS: 500.0000 mg | ORAL_CAPSULE | Freq: Two times a day (BID) | ORAL | 0 refills | Status: DC
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
## 2023-05-30 ENCOUNTER — Ambulatory Visit: Payer: Medicare Other

## 2023-05-30 ENCOUNTER — Ambulatory Visit (INDEPENDENT_AMBULATORY_CARE_PROVIDER_SITE_OTHER): Payer: Medicare Other | Admitting: Family Medicine

## 2023-05-30 ENCOUNTER — Encounter: Payer: Self-pay | Admitting: Family Medicine

## 2023-05-30 VITALS — BP 130/71 | HR 87 | Temp 97.9°F | Ht 65.0 in | Wt 217.4 lb

## 2023-05-30 DIAGNOSIS — J069 Acute upper respiratory infection, unspecified: Secondary | ICD-10-CM | POA: Diagnosis not present

## 2023-05-30 MED ORDER — PROMETHAZINE-DM 6.25-15 MG/5ML PO SYRP: 5.0000 mL | ORAL_SOLUTION | Freq: Four times a day (QID) | ORAL | 0 refills | Status: DC | PRN

## 2023-05-30 NOTE — Progress Notes (Signed)
Acute Office Visit  Subjective:     Patient ID: KAYBRI MCROBERTS, female    DOB: 1946/06/02, 77 y.o.   MRN: [id number]  Chief Complaint  Patient presents with   Facial Pain    Sinusitis This is a new problem. The current episode started yesterday. The problem is unchanged. There has been no fever. Associated symptoms include congestion, coughing, a hoarse voice, sinus pressure, sneezing and a sore throat. Pertinent negatives include no chills, ear pain, headaches or shortness of breath. Past treatments include oral decongestants. The treatment provided mild relief.   She has had a sick contact.   Review of Systems  Constitutional:  Negative for chills.  HENT:  Positive for congestion, hoarse voice, sinus pressure, sneezing and sore throat. Negative for ear pain.   Respiratory:  Positive for cough. Negative for shortness of breath.   Neurological:  Negative for headaches.        Objective:    BP 130/71   Pulse 87   Temp 97.9 F (36.6 C) (Temporal)   Ht 5\' 5"  (1.651 m)   Wt 217 lb 6 oz (98.6 kg)   SpO2 96%   BMI 36.17 kg/m    Physical Exam Vitals and nursing note reviewed.  Constitutional:      General: She is not in acute distress.    Appearance: Normal appearance. She is not ill-appearing, toxic-appearing or diaphoretic.  HENT:     Head: Normocephalic and atraumatic.     Right Ear: Tympanic membrane, ear canal and external ear normal.     Left Ear: Tympanic membrane, ear canal and external ear normal.     Nose: Congestion present.     Right Sinus: Maxillary sinus tenderness and frontal sinus tenderness present.     Left Sinus: Maxillary sinus tenderness and frontal sinus tenderness present.     Mouth/Throat:     Mouth: Mucous membranes are moist.     Pharynx: Oropharynx is clear. No oropharyngeal exudate or posterior oropharyngeal erythema.  Eyes:     General:        Right eye: No discharge.        Left eye: No discharge.     Conjunctiva/sclera: Conjunctivae  normal.  Cardiovascular:     Rate and Rhythm: Normal rate and regular rhythm.     Heart sounds: Normal heart sounds. No murmur heard. Pulmonary:     Effort: Pulmonary effort is normal. No respiratory distress.     Breath sounds: Normal breath sounds. No wheezing.  Musculoskeletal:     Cervical back: Neck supple. No rigidity.     Right lower leg: No edema.     Left lower leg: No edema.  Lymphadenopathy:     Cervical: No cervical adenopathy.  Skin:    General: Skin is warm.  Neurological:     General: No focal deficit present.     Mental Status: She is alert and oriented to person, place, and time.  Psychiatric:        Mood and Affect: Mood normal.        Behavior: Behavior normal.     No results found for any visits on 05/30/23.      Assessment & Plan:   Nadya was seen today for facial pain.  Diagnoses and all orders for this visit:  Viral URI with cough Declined Covid test today. She plans to take a home Covid test and call the office if positive. Discussed quarantine until testing and if positive. Discussed symptomatic care  and return precautions.  -     promethazine-dextromethorphan (PROMETHAZINE-DM) 6.25-15 MG/5ML syrup; Take 5 mLs by mouth 4 (four) times daily as needed for cough.   Return to office for new or worsening symptoms, or if symptoms persist.   The patient indicates understanding of these issues and agrees with the plan.  Gabriel Earing, FNP

## 2023-05-30 NOTE — Patient Instructions (Signed)

## 2023-06-04 ENCOUNTER — Ambulatory Visit: Payer: Medicare Other | Admitting: Family Medicine

## 2023-06-23 ENCOUNTER — Telehealth (INDEPENDENT_AMBULATORY_CARE_PROVIDER_SITE_OTHER): Payer: Medicare Other | Admitting: Nurse Practitioner

## 2023-06-23 DIAGNOSIS — N3000 Acute cystitis without hematuria: Secondary | ICD-10-CM | POA: Diagnosis not present

## 2023-06-23 DIAGNOSIS — R300 Dysuria: Secondary | ICD-10-CM | POA: Diagnosis not present

## 2023-06-23 LAB — URINALYSIS, COMPLETE
Bilirubin, UA: NEGATIVE
Glucose, UA: NEGATIVE
Ketones, UA: NEGATIVE
Nitrite, UA: NEGATIVE
Specific Gravity, UA: 1.015 (ref 1.005–1.030)
Urobilinogen, Ur: 0.2 mg/dL (ref 0.2–1.0)
pH, UA: 7 (ref 5.0–7.5)

## 2023-06-23 LAB — MICROSCOPIC EXAMINATION
Epithelial Cells (non renal): NONE SEEN /hpf (ref 0–10)
Renal Epithel, UA: NONE SEEN /hpf
WBC, UA: >30 /hpf — AB (ref 0–5)

## 2023-06-23 MED ORDER — CEPHALEXIN 500 MG PO CAPS: 500.0000 mg | ORAL_CAPSULE | Freq: Two times a day (BID) | ORAL | 0 refills | Status: DC

## 2023-06-23 NOTE — Patient Instructions (Signed)
Leslie Hensley, thank you for joining Bennie Pierini, FNP for today's virtual visit.  While this provider is not your primary care provider (PCP), if your PCP is located in our provider database this encounter information will be shared with them immediately following your visit.   A [facility] MyChart account gives you access to today's visit and all your visits, tests, and labs performed at [facility] " click here if you don't have a [facility] MyChart account or go to [email or web address]  Consent: (Patient) Leslie Hensley provided verbal consent for this virtual visit at the beginning of the encounter.  Current Medications:  Current Outpatient Medications:    cephALEXin (KEFLEX) 500 MG capsule, Take 1 capsule (500 mg total) by mouth 2 (two) times daily., Disp: 14 capsule, Rfl: 0   ascorbic acid (VITAMIN C) 500 MG tablet, Take 1 tablet (500 mg total) by mouth daily., Disp: 90 tablet, Rfl: 3   aspirin 81 MG EC tablet, Take 1 tablet (81 mg total) by mouth daily. Swallow whole., Disp: 90 tablet, Rfl: 3   Cholecalciferol (VITAMIN D3) 125 MCG (5000 UT) CAPS, Take 1 capsule (5,000 Units total) by mouth daily., Disp: 90 capsule, Rfl: 3   hydrochlorothiazide (HYDRODIURIL) 25 MG tablet, Take 1 tablet (25 mg total) by mouth daily., Disp: 90 tablet, Rfl: 3   levothyroxine (SYNTHROID) 75 MCG tablet, Take 1 tablet (75 mcg total) by mouth daily., Disp: 90 tablet, Rfl: 3   mirtazapine (REMERON) 15 MG tablet, Take 1 tablet (15 mg total) by mouth at bedtime., Disp: 90 tablet, Rfl: 3   Omega-3 Fatty Acids (FISH OIL) 645 MG CAPS, Take 1 capsule by mouth daily., Disp: 90 capsule, Rfl: 3   promethazine-dextromethorphan (PROMETHAZINE-DM) 6.25-15 MG/5ML syrup, Take 5 mLs by mouth 4 (four) times daily as needed for cough., Disp: 118 mL, Rfl: 0   rOPINIRole (REQUIP) 0.25 MG tablet, Take 1-3 tablets (0.25-0.75 mg total) by mouth at bedtime., Disp: 270 tablet, Rfl: 3   simvastatin (ZOCOR)  20 MG tablet, Take 1 tablet (20 mg total) by mouth daily., Disp: 90 tablet, Rfl: 3   vitamin E 180 MG (400 UNITS) capsule, Take 1 capsule (400 Units total) by mouth daily., Disp: 90 capsule, Rfl: 3   Medications ordered in this encounter:  Meds ordered this encounter  Medications   cephALEXin (KEFLEX) 500 MG capsule    Sig: Take 1 capsule (500 mg total) by mouth 2 (two) times daily.    Dispense:  14 capsule    Refill:  0    Order Specific Question:   Supervising Provider    Answer:   Arville Care A [[id number]]     *If you need refills on other medications prior to your next appointment, please contact your pharmacy*  Follow-Up: Call back or seek an in-person evaluation if the symptoms worsen or if the condition fails to improve as anticipated.  [facility] [phone]  Other Instructions Take medication as prescribe Cotton underwear Take shower not bath Cranberry juice, yogurt Force fluids AZO over the counter X2 days Culture pending RTO prn    If you have been instructed to have an in-person evaluation today at a local Urgent Care facility, please use the link below. It will take you to a list of all of our available [facility], including address, phone number and hours of operation. Please do not delay care.  [facility]  If you or a family member do not have a  primary care provider, use the link below to schedule a visit and establish care. When you choose a [facility] primary care physician or advanced practice provider, you gain a long-term partner in health. Find a Primary Care Provider  Learn more about [facility]'s in-office and virtual care options: [facility]
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
## 2023-06-26 LAB — URINE CULTURE

## 2023-06-30 ENCOUNTER — Telehealth: Payer: Self-pay | Admitting: Family Medicine

## 2023-06-30 NOTE — Telephone Encounter (Signed)
Calling back to see if anything was called in for her UTI. Please call.

## 2023-06-30 NOTE — Telephone Encounter (Signed)
Keflex  sent in on 6/24

## 2023-06-30 NOTE — Telephone Encounter (Signed)
Patient finished antibiotic last night. Will you review culture and see if antibiotic needs to be changed

## 2023-06-30 NOTE — Telephone Encounter (Signed)
  Incoming Patient Call  06/30/2023  What symptoms do you have? Not a lot of urine when pt goes to restroom. Pt says not burning but some pressure. Pt finished abx last night.  How long have you been sick? 06/23/2023  Have you been seen for this problem? 06/23/2023  If your provider decides to give you a prescription, which pharmacy would you like for it to be sent to? [facility]  Patient informed that this information will be sent to the clinical staff for review and that they should receive a follow up call.

## 2023-07-01 NOTE — Telephone Encounter (Signed)
She was given keflex and that should work fine to resolve infection

## 2023-07-02 NOTE — Telephone Encounter (Signed)
Patient notified - states symptoms have improved she will follow up as needed

## 2023-07-10 DIAGNOSIS — K080 Exfoliation of teeth due to systemic causes: Secondary | ICD-10-CM | POA: Diagnosis not present

## 2023-07-21 ENCOUNTER — Ambulatory Visit (INDEPENDENT_AMBULATORY_CARE_PROVIDER_SITE_OTHER): Payer: Medicare Other | Admitting: Family Medicine

## 2023-07-21 ENCOUNTER — Encounter: Payer: Self-pay | Admitting: Family Medicine

## 2023-07-21 VITALS — BP 148/72 | HR 78 | Temp 97.2°F | Ht 65.0 in | Wt 211.2 lb

## 2023-07-21 DIAGNOSIS — E034 Atrophy of thyroid (acquired): Secondary | ICD-10-CM

## 2023-07-21 DIAGNOSIS — N3945 Continuous leakage: Secondary | ICD-10-CM

## 2023-07-21 NOTE — Progress Notes (Signed)
Subjective: CC: Hypothyroidism PCP: Raliegh Ip, DO MVH:QIONG E Leslie Hensley is a 77 y.o. female presenting to clinic today for:  1.  Hypothyroidism Patient reports compliance with thyroid replacement.  No reports of changes in bowel habits, energy  2.  Bladder leakage Patient reports that she has bladder leaking.  Sometimes this is related to needing to go the bathroom but sometimes she simply stands and has leakage.  She has a history of "bladder tacking" out in [place] over 10 years ago.  She has a history of hysterectomy.  She does not report any bulging but does think that perhaps she may be needing repeat/revision bladder tacking.  Additionally, she is expected to have her brain coil reevaluated soon.  Would certainly defer any interventions for her bladder if she in fact has to get some type of brain surgery done   ROS: Per HPI  Allergies  Allergen Reactions   Codeine Hives   Cyclobenzaprine Swelling   Diclofenac Sodium Rash   Past Medical History:  Diagnosis Date   Brain aneurysm    Hyperlipidemia    Hypertension    Thyroid disease     Current Outpatient Medications:    ascorbic acid (VITAMIN C) 500 MG tablet, Take 1 tablet (500 mg total) by mouth daily., Disp: 90 tablet, Rfl: 3   aspirin 81 MG EC tablet, Take 1 tablet (81 mg total) by mouth daily. Swallow whole., Disp: 90 tablet, Rfl: 3   Cholecalciferol (VITAMIN D3) 125 MCG (5000 UT) CAPS, Take 1 capsule (5,000 Units total) by mouth daily., Disp: 90 capsule, Rfl: 3   hydrochlorothiazide (HYDRODIURIL) 25 MG tablet, Take 1 tablet (25 mg total) by mouth daily., Disp: 90 tablet, Rfl: 3   levothyroxine (SYNTHROID) 75 MCG tablet, Take 1 tablet (75 mcg total) by mouth daily., Disp: 90 tablet, Rfl: 3   mirtazapine (REMERON) 15 MG tablet, Take 1 tablet (15 mg total) by mouth at bedtime., Disp: 90 tablet, Rfl: 3   Omega-3 Fatty Acids (FISH OIL) 645 MG CAPS, Take 1 capsule by mouth daily., Disp: 90 capsule, Rfl: 3    rOPINIRole (REQUIP) 0.25 MG tablet, Take 1-3 tablets (0.25-0.75 mg total) by mouth at bedtime., Disp: 270 tablet, Rfl: 3   simvastatin (ZOCOR) 20 MG tablet, Take 1 tablet (20 mg total) by mouth daily., Disp: 90 tablet, Rfl: 3   vitamin E 180 MG (400 UNITS) capsule, Take 1 capsule (400 Units total) by mouth daily., Disp: 90 capsule, Rfl: 3 Social History   Socioeconomic History   Marital status: Divorced    Spouse name: Not on file   Number of children: 3   Years of education: 10   Highest education level: 10th grade  Occupational History   Occupation: retired  Tobacco Use   Smoking status: Former    Current packs/day: 0.00    Types: Cigarettes    Quit date: 12/21/1999    Years since quitting: 23.5   Smokeless tobacco: Never  Vaping Use   Vaping status: Never Used  Substance and Sexual Activity   Alcohol use: Not Currently   Drug use: Not Currently   Sexual activity: Not Currently    Birth control/protection: Surgical  Other Topics Concern   Not on file  Social History Narrative   Not on file   Social Determinants of Health   Financial Resource Strain: Low Risk  (01/09/2023)   Overall Financial Resource Strain (CARDIA)    Difficulty of Paying Living Expenses: Not hard at all  Food Insecurity:  No Food Insecurity (01/09/2023)   Hunger Vital Sign    Worried About Running Out of Food in the Last Year: Never true    Ran Out of Food in the Last Year: Never true  Transportation Needs: No Transportation Needs (01/09/2023)   PRAPARE - Administrator, Civil Service (Medical): No    Lack of Transportation (Non-Medical): No  Physical Activity: Insufficiently Active (01/09/2023)   Exercise Vital Sign    Days of Exercise per Week: 3 days    Minutes of Exercise per Session: 30 min  Stress: No Stress Concern Present (01/09/2023)   [employer] of Occupational Health - Occupational Stress Questionnaire    Feeling of Stress : Not at all  Social Connections: Moderately  Isolated (01/09/2023)   Social Connection and Isolation Panel [NHANES]    Frequency of Communication with Friends and Family: More than three times a week    Frequency of Social Gatherings with Friends and Family: More than three times a week    Attends Religious Services: More than 4 times per year    Active Member of [employer] or Organizations: No    Attends Banker Meetings: Never    Marital Status: Divorced  Catering manager Violence: Not At Risk (01/09/2023)   Humiliation, Afraid, Rape, and Kick questionnaire    Fear of Current or Ex-Partner: No    Emotionally Abused: No    Physically Abused: No    Sexually Abused: No   Family History  Problem Relation Age of Onset   Heart disease Mother    Heart disease Father    Stroke Father    Heart disease Sister    Heart disease Brother    Diabetes Brother    Heart disease Brother    Diabetes Brother    Stroke Brother    Cancer Brother     Objective: Office vital signs reviewed. BP (!) 148/72   Pulse 78   Temp (!) 97.2 F (36.2 C) (Temporal)   Ht 5\' 5"  (1.651 m)   Wt 211 lb 3.2 oz (95.8 kg)   SpO2 95%   BMI 35.15 kg/m   Physical Examination:  General: Awake, alert, well nourished, No acute distress HEENT sclera white.  No exophthalmos.  No goiter observed Cardio: regular rate and rhythm, S1S2 heard, no murmurs appreciated Pulm: clear to auscultation bilaterally, no wheezes, rhonchi or rales; normal work of breathing on room air    Assessment/ Plan: 77 y.o. female   Hypothyroidism due to acquired atrophy of thyroid - Plan: TSH, T4, Free  Continuous leakage of urine - Plan: Ambulatory referral to Urogynecology  Asymptomatic from a thyroid standpoint.  Check thyroid levels.  Referral to urogynecology for reevaluation of bladder.  Has history of bladder tacking many years ago at [facility]  Orders Placed This Encounter  Procedures   TSH   T4, Free   No orders of the defined types were placed in this  encounter.    Raliegh Ip, DO [facility] [phone]

## 2023-07-22 ENCOUNTER — Other Ambulatory Visit: Payer: Self-pay | Admitting: Family Medicine

## 2023-07-22 DIAGNOSIS — E034 Atrophy of thyroid (acquired): Secondary | ICD-10-CM

## 2023-07-22 LAB — T4, FREE: Free T4: 1.33 ng/dL (ref 0.82–1.77)

## 2023-07-22 LAB — TSH: TSH: 0.313 u[IU]/mL — ABNORMAL LOW (ref 0.450–4.500)

## 2023-08-08 DIAGNOSIS — I671 Cerebral aneurysm, nonruptured: Secondary | ICD-10-CM | POA: Diagnosis not present

## 2023-08-08 DIAGNOSIS — Z7982 Long term (current) use of aspirin: Secondary | ICD-10-CM | POA: Diagnosis not present

## 2023-08-25 DIAGNOSIS — I671 Cerebral aneurysm, nonruptured: Secondary | ICD-10-CM | POA: Diagnosis not present

## 2023-09-16 ENCOUNTER — Ambulatory Visit: Payer: Medicare Other

## 2023-09-22 ENCOUNTER — Telehealth: Payer: Self-pay | Admitting: Family Medicine

## 2023-09-22 DIAGNOSIS — K080 Exfoliation of teeth due to systemic causes: Secondary | ICD-10-CM | POA: Diagnosis not present

## 2023-09-22 NOTE — Telephone Encounter (Signed)
I spoke to pt and she states she has noticed bilateral lower extremity edema for the past week. It gets worse when her legs are down in dependent position and in the morning when she wakes up the swelling has resolved. Pt denies headache, visual disturbances, SOB, weakness, chest pain, dizziness or feeling like she may pass out. Pt scheduled with Dr Reece Agar for next available which is what pt requested 10/01/23 at 10:15. Pt advised if any of the above mentioned symptoms occur she should go to ED or call 911 for evaluation and pt voiced understanding.

## 2023-09-23 ENCOUNTER — Ambulatory Visit (INDEPENDENT_AMBULATORY_CARE_PROVIDER_SITE_OTHER): Payer: Medicare Other | Admitting: *Deleted

## 2023-09-23 ENCOUNTER — Other Ambulatory Visit: Payer: Medicare Other

## 2023-09-23 DIAGNOSIS — E034 Atrophy of thyroid (acquired): Secondary | ICD-10-CM

## 2023-09-23 DIAGNOSIS — R600 Localized edema: Secondary | ICD-10-CM | POA: Diagnosis not present

## 2023-09-23 NOTE — Progress Notes (Signed)
Patient in today for BP check. BP within normal range. Will forward to PCP.

## 2023-09-24 LAB — TSH: TSH: 0.788 u[IU]/mL (ref 0.450–4.500)

## 2023-09-24 LAB — T4, FREE: Free T4: 1.14 ng/dL (ref 0.82–1.77)

## 2023-10-01 ENCOUNTER — Encounter: Payer: Self-pay | Admitting: Family Medicine

## 2023-10-01 ENCOUNTER — Ambulatory Visit: Payer: Medicare Other | Admitting: Family Medicine

## 2023-10-01 VITALS — BP 134/71 | HR 84 | Temp 98.2°F | Ht 65.0 in | Wt 211.0 lb

## 2023-10-01 DIAGNOSIS — R600 Localized edema: Secondary | ICD-10-CM | POA: Diagnosis not present

## 2023-10-01 NOTE — Addendum Note (Signed)
Addended by: Raliegh Ip on: 10/01/2023 07:23 PM   Modules accepted: Orders

## 2023-10-01 NOTE — Progress Notes (Addendum)
Subjective: CC: Leg edema PCP: Raliegh Ip, DO BJY:NWGNF E Cranor is a 77 y.o. female presenting to clinic today for:  1.  Leg edema Patient reports couple week history of leg edema that seems to be worse at the end of the day.  1 point the legs had gotten so swollen that she sought care in the ER.  She had chest x-ray performed an EKG and neither these or revealing or concerning for cardiac event but they did recommend that she follow-up with her heart doctor.  She was under the care of a heart doctor in [place] back in 2015 when she suffered her brain aneurysm.  She has been lost to follow-up since that time.  She would like a new referral today if possible.  She reports compliance with her Synthroid, hydrochlorothiazide.  She reports no bleeding.  No changes in diet.  Denies any increased salt intake.  Has compression hose at home but does not utilize these.  She reports no orthopnea, shortness of breath or change in exercise tolerance.  No chest pain   ROS: Per HPI  Allergies  Allergen Reactions   Codeine Hives   Cyclobenzaprine Swelling   Diclofenac Sodium Rash   Past Medical History:  Diagnosis Date   Brain aneurysm    Hyperlipidemia    Hypertension    Thyroid disease     Current Outpatient Medications:    ascorbic acid (VITAMIN C) 500 MG tablet, Take 1 tablet (500 mg total) by mouth daily., Disp: 90 tablet, Rfl: 3   aspirin 81 MG EC tablet, Take 1 tablet (81 mg total) by mouth daily. Swallow whole., Disp: 90 tablet, Rfl: 3   Cholecalciferol (VITAMIN D3) 125 MCG (5000 UT) CAPS, Take 1 capsule (5,000 Units total) by mouth daily., Disp: 90 capsule, Rfl: 3   hydrochlorothiazide (HYDRODIURIL) 25 MG tablet, Take 1 tablet (25 mg total) by mouth daily., Disp: 90 tablet, Rfl: 3   levothyroxine (SYNTHROID) 75 MCG tablet, Take 1 tablet (75 mcg total) by mouth daily., Disp: 90 tablet, Rfl: 3   mirtazapine (REMERON) 15 MG tablet, Take 1 tablet (15 mg total) by mouth at  bedtime., Disp: 90 tablet, Rfl: 3   Omega-3 Fatty Acids (FISH OIL) 645 MG CAPS, Take 1 capsule by mouth daily., Disp: 90 capsule, Rfl: 3   rOPINIRole (REQUIP) 0.25 MG tablet, Take 1-3 tablets (0.25-0.75 mg total) by mouth at bedtime., Disp: 270 tablet, Rfl: 3   simvastatin (ZOCOR) 20 MG tablet, Take 1 tablet (20 mg total) by mouth daily., Disp: 90 tablet, Rfl: 3   vitamin E 180 MG (400 UNITS) capsule, Take 1 capsule (400 Units total) by mouth daily., Disp: 90 capsule, Rfl: 3 Social History   Socioeconomic History   Marital status: Divorced    Spouse name: Not on file   Number of children: 3   Years of education: 10   Highest education level: 10th grade  Occupational History   Occupation: retired  Tobacco Use   Smoking status: Former    Current packs/day: 0.00    Types: Cigarettes    Quit date: 12/21/1999    Years since quitting: 23.7   Smokeless tobacco: Never  Vaping Use   Vaping status: Never Used  Substance and Sexual Activity   Alcohol use: Not Currently   Drug use: Not Currently   Sexual activity: Not Currently    Birth control/protection: Surgical  Other Topics Concern   Not on file  Social History Narrative   Not on  file   Social Determinants of Health   Financial Resource Strain: Low Risk  (01/09/2023)   Overall Financial Resource Strain (CARDIA)    Difficulty of Paying Living Expenses: Not hard at all  Food Insecurity: No Food Insecurity (01/09/2023)   Hunger Vital Sign    Worried About Running Out of Food in the Last Year: Never true    Ran Out of Food in the Last Year: Never true  Transportation Needs: No Transportation Needs (01/09/2023)   PRAPARE - Administrator, Civil Service (Medical): No    Lack of Transportation (Non-Medical): No  Physical Activity: Insufficiently Active (01/09/2023)   Exercise Vital Sign    Days of Exercise per Week: 3 days    Minutes of Exercise per Session: 30 min  Stress: No Stress Concern Present (01/09/2023)   [employer] of Occupational Health - Occupational Stress Questionnaire    Feeling of Stress : Not at all  Social Connections: Moderately Isolated (01/09/2023)   Social Connection and Isolation Panel [NHANES]    Frequency of Communication with Friends and Family: More than three times a week    Frequency of Social Gatherings with Friends and Family: More than three times a week    Attends Religious Services: More than 4 times per year    Active Member of [employer] or Organizations: No    Attends Banker Meetings: Never    Marital Status: Divorced  Catering manager Violence: Not At Risk (01/09/2023)   Humiliation, Afraid, Rape, and Kick questionnaire    Fear of Current or Ex-Partner: No    Emotionally Abused: No    Physically Abused: No    Sexually Abused: No   Family History  Problem Relation Age of Onset   Heart disease Mother    Heart disease Father    Stroke Father    Heart disease Sister    Heart disease Brother    Diabetes Brother    Heart disease Brother    Diabetes Brother    Stroke Brother    Cancer Brother     Objective: Office vital signs reviewed. BP 134/71   Pulse 84   Temp 98.2 F (36.8 C)   Ht 5\' 5"  (1.651 m)   Wt 211 lb (95.7 kg)   SpO2 93%   BMI 35.11 kg/m   Physical Examination:  General: Awake, alert, well nourished, No acute distress HEENT: No conjunctival pallor.  No exophthalmos. Cardio: regular rate and rhythm, S1S2 heard, no murmurs appreciated Pulm: clear to auscultation bilaterally, no wheezes, rhonchi or rales; normal work of breathing on room air Extremities: warm, well perfused, trace to +1 lower extremity edema to mid shins left greater than right.  No cyanosis or clubbing; +2 pulses bilaterally Skin : Varicose veins present in bilateral lower extremities  Imaging Results - XR Chest 2 Views (09/23/2023 1:39 PM EDT) Impressions  09/23/2023 2:37 PM EDT  There is no evidence of acute cardiac or pulmonary abnormality.     Imaging  Results - XR Chest 2 Views (09/23/2023 1:39 PM EDT) Narrative  09/23/2023 2:37 PM EDT  XR CHEST 2 VIEWS, 09/23/2023 1:39 PM  INDICATION: Localized edema \ R60.0 Localized edema COMPARISON: CT chest from 03/28/2021  FINDINGS:  Cardiovascular/Mediastinum: Stable cardiomediastinal silhouette. Aortic arch calcifications. Lungs/pleura: Left basilar subsegmental atelectasis. Upper abdomen: Visualized portions are unremarkable. Chest wall/osseous structures: Mild degenerative changes of the thoracic spine.       Assessment/ Plan: 77 y.o. female   Bilateral leg edema -  Plan: Brain natriuretic peptide, CMP14+EGFR, CBC, TSH, T4, Free, Ambulatory referral to Cardiology  Uncertain etiology but I favor that this is a likely dependent edema.  She had some visible varicose veins on the lower extremities.  She has no other signs that would suggest cardiac etiology but I am glad to obtain a BNP to further evaluate.  I reviewed the recent urgent care note which did not show any pulmonary edema or cardiomegaly.  Will evaluate for any metabolic etiology including undertreated hypothyroidism, anemia and renal dysfunction.  If in fact all these labs are unrevealing, I would recommend that she see vascular specialist for further evaluation of what is I suspect to be venous stasis.  She was amenable to plan.  She will start compression hose and continue elevation of lower extremities  I have referred her back to her cardiologist as she requested   Raliegh Ip, DO [facility] [phone]

## 2023-10-01 NOTE — Patient Instructions (Signed)
I suspect something called VENOUS STASIS EDEMA. We are going to make sure no heart enzyme elevations today. Will have labs back in a couple days. I've referred you back to your heart doctor in [place] Will refer to a vascular specialist to look at your legs as well of your labs are normal. Use compression hose daily  Chronic Venous Insufficiency Chronic venous insufficiency is a condition that causes the veins in the legs to struggle to pump blood from the legs to the heart. It is also called venous stasis. This condition can happen when the vein walls are stretched, weakened, or damaged. It can also happen when the valves inside the vein are damaged. With the right treatment, you should be able to still lead an active life. What are the causes? Common causes of this condition include: Venous hypertension. This is high blood pressure inside the veins. Sitting or standing too long. This can cause increased blood pressure in the veins of the leg. Deep vein thrombosis (DVT). This is a blood clot that blocks blood flow in a vein. Phlebitis. This is inflammation of a vein. It can cause a blood clot to form. An abnormal growth of cells (tumor) in the area between your hip bones (pelvis). This can cause blood to back up. What increases the risk? Factors that may make you more likely to get this condition include: Having a family history of the condition. Being overweight. Being pregnant. Not getting enough exercise. Smoking. Having a job that requires you to sit or stand in one place for a long time. Being a certain age. Females in their 82s and 52s and males in their 59s are more likely to get this condition. What are the signs or symptoms? Symptoms of this condition include: Varicose veins. These are veins that are enlarged, bulging, or twisted. Skin breakdown or ulcers. Reddened skin or dark discoloration of the skin on the leg between the knee and ankle. Lipodermatosclerosis. This is  brown, smooth, tight, and painful skin just above the ankle. It is often on the inside of the leg. Swelling of the legs. How is this diagnosed? This condition may be diagnosed based on your medical history and a physical exam. You may also need tests, such as: A duplex ultrasound. This shows how blood flows through a blood vessel. Plethysmography. This tests blood flow. Venogram. This looks at the veins using an X-ray and dye. How is this treated? The goals of treatment are to help you return to an active life and to relieve pain. Treatment may include: Wearing compression stockings. These do not cure the condition but can help relieve symptoms. They can also help stop your condition from getting worse. Sclerotherapy. This involves injecting a solution to shrink damaged veins. Surgery. This may include: Vein stripping. This is when a diseased vein is taken out. Laser ablation surgery. This is when blood flow is cut off through the vein. Repairing or remaking a valve inside the affected vein. Follow these instructions at home: Lifestyle Do not use any products that contain nicotine or tobacco. These products include cigarettes, chewing tobacco, and vaping devices, such as e-cigarettes. If you need help quitting, ask your health care provider. Stay active. Exercise, walk, or do other activities. Ask your provider what activities are safe for you. General instructions Take over-the-counter and prescription medicines only as told by your provider. Drink enough fluid to keep your pee (urine) pale yellow. Wear compression stockings as told by your provider. These stockings help to prevent  blood clots and reduce swelling in your legs. Keep all follow-up visits. Your provider will check your legs for any changes and adjust your treatment plan as needed. Contact a health care provider if: You have redness, swelling, or more pain in the affected area. You see a red streak or line that goes up or down  from the area. You have skin breakdown or skin loss. You get an injury in the affected area. You get a fever. Get help right away if: You have severe pain that does not get better with medicine. You get an injury and an open wound in the affected area. Your foot or ankle becomes numb or weak all of a sudden. You have trouble moving your foot or ankle. Your symptoms do not go away or get worse. You have chest pain. You have shortness of breath. These symptoms may be an emergency. Get help right away. Call 911. Do not wait to see if the symptoms will go away. Do not drive yourself to the hospital. This information is not intended to replace advice given to you by your health care provider. Make sure you discuss any questions you have with your health care provider. Document Revised: 12/31/2022 Document Reviewed: 12/31/2022 Elsevier Patient Education  2024 [employer].

## 2023-10-02 LAB — CBC
Hematocrit: 42.6 % (ref 34.0–46.6)
Hemoglobin: 13.8 g/dL (ref 11.1–15.9)
MCH: 30.9 pg (ref 26.6–33.0)
MCHC: 32.4 g/dL (ref 31.5–35.7)
MCV: 96 fL (ref 79–97)
Platelets: 264 10*3/uL (ref 150–450)
RBC: 4.46 x10E6/uL (ref 3.77–5.28)
RDW: 13.7 % (ref 11.7–15.4)
WBC: 5.5 10*3/uL (ref 3.4–10.8)

## 2023-10-02 LAB — CMP14+EGFR
ALT: 14 IU/L (ref 0–32)
AST: 17 IU/L (ref 0–40)
Albumin: 4.3 g/dL (ref 3.8–4.8)
Alkaline Phosphatase: 95 IU/L (ref 44–121)
BUN/Creatinine Ratio: 14 (ref 12–28)
BUN: 10 mg/dL (ref 8–27)
Bilirubin Total: 0.4 mg/dL (ref 0.0–1.2)
CO2: 23 mmol/L (ref 20–29)
Calcium: 9.5 mg/dL (ref 8.7–10.3)
Chloride: 105 mmol/L (ref 96–106)
Creatinine, Ser: 0.69 mg/dL (ref 0.57–1.00)
Globulin, Total: 3 g/dL (ref 1.5–4.5)
Glucose: 94 mg/dL (ref 70–99)
Potassium: 4.4 mmol/L (ref 3.5–5.2)
Sodium: 143 mmol/L (ref 134–144)
Total Protein: 7.3 g/dL (ref 6.0–8.5)
eGFR: 90 mL/min/{1.73_m2} (ref >59)

## 2023-10-02 LAB — TSH: TSH: 0.633 u[IU]/mL (ref 0.450–4.500)

## 2023-10-02 LAB — BRAIN NATRIURETIC PEPTIDE: BNP: 31 pg/mL (ref 0.0–100.0)

## 2023-10-02 LAB — T4, FREE: Free T4: 1.33 ng/dL (ref 0.82–1.77)

## 2023-10-06 ENCOUNTER — Telehealth: Payer: Self-pay | Admitting: Family Medicine

## 2023-10-06 DIAGNOSIS — R600 Localized edema: Secondary | ICD-10-CM

## 2023-10-06 NOTE — Telephone Encounter (Signed)
Orders Placed This Encounter  Procedures   Ambulatory referral to Vascular Surgery    Referral Priority:   Routine    Referral Type:   Surgical    Referral Reason:   Specialty Services Required    Requested Specialty:   Vascular Surgery    Number of Visits Requested:   1   Spoke to pt. Referral placed to Dr Baruch Gouty. Pt says that he is located in [place]. Phone [phone] or [phone] , whom she has seen before.

## 2023-10-08 NOTE — Telephone Encounter (Signed)
Referral for Cardiology has been faxed to Dr. Cindie Laroche Day as Patient requested and Vascular Referral has been faxed to Dr. Lysle Dingwall as Patient requested.

## 2023-10-15 ENCOUNTER — Encounter: Payer: Self-pay | Admitting: *Deleted

## 2023-10-20 ENCOUNTER — Ambulatory Visit (INDEPENDENT_AMBULATORY_CARE_PROVIDER_SITE_OTHER): Payer: Medicare Other | Admitting: Nurse Practitioner

## 2023-10-20 ENCOUNTER — Encounter: Payer: Self-pay | Admitting: Nurse Practitioner

## 2023-10-20 VITALS — BP 97/60 | HR 85 | Temp 97.7°F | Ht 65.0 in | Wt 212.2 lb

## 2023-10-20 DIAGNOSIS — R300 Dysuria: Secondary | ICD-10-CM

## 2023-10-20 DIAGNOSIS — N3001 Acute cystitis with hematuria: Secondary | ICD-10-CM | POA: Diagnosis not present

## 2023-10-20 LAB — URINALYSIS, ROUTINE W REFLEX MICROSCOPIC
Bilirubin, UA: NEGATIVE
Glucose, UA: NEGATIVE
Ketones, UA: NEGATIVE
Nitrite, UA: NEGATIVE
Specific Gravity, UA: 1.02 (ref 1.005–1.030)
Urobilinogen, Ur: 0.2 mg/dL (ref 0.2–1.0)
pH, UA: 7 (ref 5.0–7.5)

## 2023-10-20 LAB — MICROSCOPIC EXAMINATION
RBC, Urine: NONE SEEN /[HPF] (ref 0–2)
Renal Epithel, UA: NONE SEEN /[HPF]
WBC, UA: >30 /[HPF] — AB (ref 0–5)

## 2023-10-20 MED ORDER — DOXYCYCLINE HYCLATE 100 MG PO CAPS: 100.0000 mg | ORAL_CAPSULE | Freq: Two times a day (BID) | ORAL | 0 refills | Status: DC

## 2023-10-20 MED ORDER — FLUCONAZOLE 150 MG PO TABS: 150.0000 mg | ORAL_TABLET | Freq: Every day | ORAL | 0 refills | Status: DC
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
## 2023-10-25 LAB — URINE CULTURE

## 2023-10-27 DIAGNOSIS — R600 Localized edema: Secondary | ICD-10-CM | POA: Diagnosis not present

## 2023-11-20 ENCOUNTER — Telehealth: Payer: Self-pay | Admitting: Family Medicine

## 2023-11-20 NOTE — Telephone Encounter (Signed)
Copied from CRM [id number]. Topic: General - Other >> Nov 19, 2023  2:56 PM Almira Coaster wrote: Reason for CRM: Patient is calling to follow up on an appointment that [facility] Gottschalk's nurse was going to help her schedule but was never made. She would like to speak with the nurse. Call back number is [phone].

## 2023-11-20 NOTE — Telephone Encounter (Signed)
Spoke with pt - updated pt on referral info

## 2023-11-24 DIAGNOSIS — N393 Stress incontinence (female) (male): Secondary | ICD-10-CM | POA: Diagnosis not present

## 2023-11-24 DIAGNOSIS — N958 Other specified menopausal and perimenopausal disorders: Secondary | ICD-10-CM | POA: Diagnosis not present

## 2023-11-24 DIAGNOSIS — R32 Unspecified urinary incontinence: Secondary | ICD-10-CM | POA: Diagnosis not present

## 2023-11-24 DIAGNOSIS — N390 Urinary tract infection, site not specified: Secondary | ICD-10-CM | POA: Diagnosis not present

## 2023-11-24 DIAGNOSIS — N3281 Overactive bladder: Secondary | ICD-10-CM | POA: Diagnosis not present

## 2023-12-09 DIAGNOSIS — Z1231 Encounter for screening mammogram for malignant neoplasm of breast: Secondary | ICD-10-CM | POA: Diagnosis not present

## 2023-12-09 DIAGNOSIS — R002 Palpitations: Secondary | ICD-10-CM | POA: Diagnosis not present

## 2023-12-09 DIAGNOSIS — M7989 Other specified soft tissue disorders: Secondary | ICD-10-CM | POA: Diagnosis not present

## 2023-12-09 LAB — HM MAMMOGRAPHY

## 2023-12-16 ENCOUNTER — Encounter: Payer: Self-pay | Admitting: Family Medicine

## 2024-01-09 ENCOUNTER — Other Ambulatory Visit: Payer: Self-pay | Admitting: Family Medicine

## 2024-01-09 DIAGNOSIS — E034 Atrophy of thyroid (acquired): Secondary | ICD-10-CM

## 2024-01-09 DIAGNOSIS — R600 Localized edema: Secondary | ICD-10-CM

## 2024-01-09 DIAGNOSIS — E785 Hyperlipidemia, unspecified: Secondary | ICD-10-CM

## 2024-01-09 DIAGNOSIS — F5101 Primary insomnia: Secondary | ICD-10-CM

## 2024-01-09 DIAGNOSIS — F418 Other specified anxiety disorders: Secondary | ICD-10-CM

## 2024-01-13 ENCOUNTER — Ambulatory Visit: Payer: Medicare Other

## 2024-01-13 VITALS — Ht 65.0 in | Wt 212.0 lb

## 2024-01-13 DIAGNOSIS — Z0000 Encounter for general adult medical examination without abnormal findings: Secondary | ICD-10-CM | POA: Diagnosis not present

## 2024-01-13 DIAGNOSIS — K080 Exfoliation of teeth due to systemic causes: Secondary | ICD-10-CM | POA: Diagnosis not present

## 2024-01-13 NOTE — Patient Instructions (Signed)
 Ms. Zuk , Thank you for taking time to come for your Medicare Wellness Visit. I appreciate your ongoing commitment to your health goals. Please review the following plan we discussed and let me know if I can assist you in the future.   Referrals/Orders/Follow-Ups/Clinician Recommendations: Aim for 30 minutes of exercise or brisk walking, 6-8 glasses of water, and 5 servings of fruits and vegetables each day.  This is a list of the screening recommended for you and due dates:  Health Maintenance  Topic Date Due   COVID-19 Vaccine (1) Never done   Hepatitis C Screening  Never done   Pneumonia Vaccine (1 of 1 - PCV) Never done   Flu Shot  03/29/2024*   DEXA scan (bone density measurement)  12/02/2024   Mammogram  12/08/2024   Medicare Annual Wellness Visit  01/12/2025   DTaP/Tdap/Td vaccine (3 - Td or Tdap) 06/23/2029   Zoster (Shingles) Vaccine  Completed   HPV Vaccine  Aged Out   Colon Cancer Screening  Discontinued  *Topic was postponed. The date shown is not the original due date.    Advanced directives: (ACP Link)Information on Advanced Care Planning can be found at [place]  Secretary of [facility] Advance Health Care Directives Advance Health Care Directives ([email or web address]   Next Medicare Annual Wellness Visit scheduled for next year: Yes

## 2024-01-13 NOTE — Progress Notes (Signed)
 Subjective:   Leslie Hensley is a 78 y.o. female who presents for Medicare Annual (Subsequent) preventive examination.  Visit Complete: Virtual I connected with  Leslie Hensley on 01/13/24 by a audio enabled telemedicine application and verified that I am speaking with the correct person using two identifiers.  Patient Location: Home  Provider Location: Home Office  This patient declined Interactive audio and video telecommunications. Therefore the visit was completed with audio only.  I discussed the limitations of evaluation and management by telemedicine. The patient expressed understanding and agreed to proceed.  Vital Signs: Because this visit was a virtual/telehealth visit, some criteria may be missing or patient reported. Any vitals not documented were not able to be obtained and vitals that have been documented are patient reported.  Cardiac Risk Factors include: advanced age (>44men, >11 women);hypertension;dyslipidemia     Objective:    Today's Vitals   01/13/24 1004  Weight: 212 lb (96.2 kg)  Height: 5' 5 (1.651 m)   Body mass index is 35.28 kg/m.     01/13/2024   10:29 AM 01/09/2023   11:18 AM 01/08/2022   10:39 AM 01/04/2021   10:11 AM 07/10/2020   12:37 PM 12/21/2019    9:28 AM  Advanced Directives  Does Patient Have a Medical Advance Directive? No Yes No No Yes No  Type of Special Educational Needs Teacher of [place];Living will      Copy of Healthcare Power of Attorney in Chart?  No - copy requested      Would patient like information on creating a medical advance directive? Yes (MAU/Ambulatory/Procedural Areas - Information given)  No - Patient declined No - Patient declined  No - Patient declined    Current Medications (verified) Outpatient Encounter Medications as of 01/13/2024  Medication Sig   ascorbic acid  (VITAMIN C) 500 MG tablet Take 1 tablet (500 mg total) by mouth daily.   aspirin  81 MG EC tablet Take 1 tablet (81 mg total) by mouth daily.  Swallow whole.   Cholecalciferol (VITAMIN D3) 125 MCG (5000 UT) CAPS Take 1 capsule (5,000 Units total) by mouth daily.   hydrochlorothiazide  (HYDRODIURIL ) 25 MG tablet TAKE 1 TABLET BY MOUTH DAILY. GENERIC EQUIVALENT FOR HYDRODIURIL    levothyroxine  (SYNTHROID ) 75 MCG tablet TAKE 1 TABLET BY MOUTH DAILY   mirtazapine  (REMERON ) 15 MG tablet TAKE 1 TABLET BY MOUTH AT BEDTIME   Omega-3 Fatty Acids (FISH OIL ) 645 MG CAPS Take 1 capsule by mouth daily.   rOPINIRole  (REQUIP ) 0.25 MG tablet Take 1-3 tablets (0.25-0.75 mg total) by mouth at bedtime.   simvastatin  (ZOCOR ) 20 MG tablet TAKE 1 TABLET BY MOUTH DAILY   vitamin E  180 MG (400 UNITS) capsule Take 1 capsule (400 Units total) by mouth daily.   doxycycline  (VIBRAMYCIN ) 100 MG capsule Take 1 capsule (100 mg total) by mouth 2 (two) times daily. (Patient not taking: Reported on 01/13/2024)   fluconazole  (DIFLUCAN ) 150 MG tablet Take 1 tablet (150 mg total) by mouth daily. (Patient not taking: Reported on 01/13/2024)   No facility-administered encounter medications on file as of 01/13/2024.    Allergies (verified) Codeine, Cyclobenzaprine, and Diclofenac sodium   History: Past Medical History:  Diagnosis Date   Brain aneurysm    Hyperlipidemia    Hypertension    Thyroid  disease    Past Surgical History:  Procedure Laterality Date   ABDOMINAL HYSTERECTOMY     BRAIN SURGERY     aneurysm   FOOT SURGERY Left  spur removed   KIDNEY STONE SURGERY     Family History  Problem Relation Age of Onset   Heart disease Mother    Heart disease Father    Stroke Father    Heart disease Sister    Heart disease Brother    Diabetes Brother    Heart disease Brother    Diabetes Brother    Stroke Brother    Cancer Brother    Social History   Socioeconomic History   Marital status: Divorced    Spouse name: Not on file   Number of children: 3   Years of education: 10   Highest education level: 10th grade  Occupational History   Occupation:  retired  Tobacco Use   Smoking status: Former    Current packs/day: 0.00    Types: Cigarettes    Quit date: 12/21/1999    Years since quitting: 24.0   Smokeless tobacco: Never  Vaping Use   Vaping status: Never Used  Substance and Sexual Activity   Alcohol use: Not Currently   Drug use: Not Currently   Sexual activity: Not Currently    Birth control/protection: Surgical  Other Topics Concern   Not on file  Social History Narrative   Not on file   Social Drivers of Health   Financial Resource Strain: Low Risk  (01/13/2024)   Overall Financial Resource Strain (CARDIA)    Difficulty of Paying Living Expenses: Not hard at all  Food Insecurity: No Food Insecurity (01/13/2024)   Hunger Vital Sign    Worried About Running Out of Food in the Last Year: Never true    Ran Out of Food in the Last Year: Never true  Transportation Needs: No Transportation Needs (01/13/2024)   PRAPARE - Administrator, Civil Service (Medical): No    Lack of Transportation (Non-Medical): No  Physical Activity: Insufficiently Active (01/13/2024)   Exercise Vital Sign    Days of Exercise per Week: 3 days    Minutes of Exercise per Session: 30 min  Stress: No Stress Concern Present (01/13/2024)   [employer] of Occupational Health - Occupational Stress Questionnaire    Feeling of Stress : Not at all  Social Connections: Moderately Isolated (01/13/2024)   Social Connection and Isolation Panel [NHANES]    Frequency of Communication with Friends and Family: More than three times a week    Frequency of Social Gatherings with Friends and Family: Three times a week    Attends Religious Services: More than 4 times per year    Active Member of Clubs or Organizations: No    Attends Banker Meetings: Never    Marital Status: Divorced    Tobacco Counseling Counseling given: Not Answered   Clinical Intake:  Pre-visit preparation completed: Yes  Pain : No/denies pain      Diabetes: No  How often do you need to have someone help you when you read instructions, pamphlets, or other written materials from your doctor or pharmacy?: 1 - Never  Interpreter Needed?: No  Information entered by :: Charmaine Bloodgood LPN   Activities of Daily Living    01/13/2024   10:28 AM  In your present state of health, do you have any difficulty performing the following activities:  Hearing? 0  Vision? 0  Difficulty concentrating or making decisions? 0  Walking or climbing stairs? 0  Dressing or bathing? 0  Doing errands, shopping? 0  Preparing Food and eating ? N  Using the Toilet? N  In the past six months, have you accidently leaked urine? N  Do you have problems with loss of bowel control? N  Managing your Medications? N  Managing your Finances? N  Housekeeping or managing your Housekeeping? N    Patient Care Team: Jolinda Norene HERO, DO as PCP - General (Family Medicine)  Indicate any recent Medical Services you may have received from other than [facility] providers in the past year (date may be approximate).     Assessment:   This is a routine wellness examination for Goodman.  Hearing/Vision screen Hearing Screening - Comments:: Denies hearing difficulties   Vision Screening - Comments:: No vision problems; will schedule routine eye exam soon     Goals Addressed             This Visit's Progress    Remain active and independent        Depression Screen    01/13/2024   10:28 AM 10/20/2023    1:56 PM 10/01/2023   10:11 AM 07/21/2023    3:28 PM 05/30/2023    2:18 PM 01/09/2023   11:17 AM 12/02/2022   10:46 AM  PHQ 2/9 Scores  PHQ - 2 Score 0 0 0 0 0 0 0  PHQ- 9 Score  0 0 0 0      Fall Risk    01/13/2024   10:28 AM 10/20/2023    1:56 PM 10/01/2023   10:11 AM 07/21/2023    3:28 PM 05/30/2023    2:18 PM  Fall Risk   Falls in the past year? 0 0 0 0 1  Number falls in past yr: 0 0 0  0  Injury with Fall? 0 0 0  0  Risk for fall due to : No Fall  Risks No Fall Risks No Fall Risks  History of fall(s)  Follow up Falls prevention discussed;Education provided;Falls evaluation completed Falls evaluation completed Education provided  Falls evaluation completed    MEDICARE RISK AT HOME: Medicare Risk at Home Any stairs in or around the home?: No If so, are there any without handrails?: No Home free of loose throw rugs in walkways, pet beds, electrical cords, etc?: Yes Adequate lighting in your home to reduce risk of falls?: Yes Life alert?: No Use of a cane, walker or w/c?: No Grab bars in the bathroom?: Yes Shower chair or bench in shower?: No Elevated toilet seat or a handicapped toilet?: Yes  TIMED UP AND GO:  Was the test performed?  No    Cognitive Function:        01/13/2024   10:29 AM 01/09/2023   11:18 AM 01/04/2021   10:14 AM 12/21/2019    9:32 AM  6CIT Screen  What Year? 0 points 0 points 0 points 0 points  What month? 0 points 0 points 0 points 0 points  What time? 0 points 0 points 0 points 0 points  Count back from 20 0 points 0 points 0 points 0 points  Months in reverse 0 points 0 points 2 points 2 points  Repeat phrase 0 points 0 points 0 points 0 points  Total Score 0 points 0 points 2 points 2 points    Immunizations Immunization History  Administered Date(s) Administered   Td 06/24/2019   Tdap 06/24/2019   Zoster Recombinant(Shingrix) 01/04/2022, 03/11/2022    TDAP status: Up to date  Flu Vaccine status: Declined, Education has been provided regarding the importance of this vaccine but patient still declined. Advised may receive this  vaccine at local pharmacy or Health Dept. Aware to provide a copy of the vaccination record if obtained from local pharmacy or Health Dept. Verbalized acceptance and understanding.  Pneumococcal vaccine status: Declined,  Education has been provided regarding the importance of this vaccine but patient still declined. Advised may receive this vaccine at local pharmacy or  Health Dept. Aware to provide a copy of the vaccination record if obtained from local pharmacy or Health Dept. Verbalized acceptance and understanding.   Covid-19 vaccine status: Information provided on how to obtain vaccines.   Qualifies for Shingles Vaccine? Yes   Zostavax completed No   Shingrix Completed?: Yes  Screening Tests Health Maintenance  Topic Date Due   COVID-19 Vaccine (1) Never done   Hepatitis C Screening  Never done   Pneumonia Vaccine 24+ Years old (1 of 1 - PCV) Never done   INFLUENZA VACCINE  03/29/2024 (Originally 07/31/2023)   DEXA SCAN  12/02/2024   MAMMOGRAM  12/08/2024   Medicare Annual Wellness (AWV)  01/12/2025   DTaP/Tdap/Td (3 - Td or Tdap) 06/23/2029   Zoster Vaccines- Shingrix  Completed   HPV VACCINES  Aged Out   Colonoscopy  Discontinued    Health Maintenance  Health Maintenance Due  Topic Date Due   COVID-19 Vaccine (1) Never done   Hepatitis C Screening  Never done   Pneumonia Vaccine 22+ Years old (1 of 1 - PCV) Never done    Colorectal cancer screening: No longer required.   Mammogram status: Completed 12/09/23. Repeat every year  Bone Density status: Completed 12/02/22. Results reflect: Bone density results: OSTEOPOROSIS. Repeat every 2 years.  Lung Cancer Screening: (Low Dose CT Chest recommended if Age 41-80 years, 20 pack-year currently smoking OR have quit w/in 15years.) does not qualify.   Lung Cancer Screening Referral: n/a  Additional Screening:  Hepatitis C Screening: does qualify  Vision Screening: Recommended annual ophthalmology exams for early detection of glaucoma and other disorders of the eye. Is the patient up to date with their annual eye exam?  No  Who is the provider or what is the name of the office in which the patient attends annual eye exams? none If pt is not established with a provider, would they like to be referred to a provider to establish care? No .   Dental Screening: Recommended annual dental exams  for proper oral hygiene  Community Resource Referral / Chronic Care Management: CRR required this visit?  No   CCM required this visit?  No     Plan:     I have personally reviewed and noted the following in the patient's chart:   Medical and social history Use of alcohol, tobacco or illicit drugs  Current medications and supplements including opioid prescriptions. Patient is not currently taking opioid prescriptions. Functional ability and status Nutritional status Physical activity Advanced directives List of other physicians Hospitalizations, surgeries, and ER visits in previous 12 months Vitals Screenings to include cognitive, depression, and falls Referrals and appointments  In addition, I have reviewed and discussed with patient certain preventive protocols, quality metrics, and best practice recommendations. A written personalized care plan for preventive services as well as general preventive health recommendations were provided to patient.     Lavelle Pfeiffer [place], [place]   [id number]   After Visit Summary: (Mail) Due to this being a telephonic visit, the after visit summary with patients personalized plan was offered to patient via mail   Nurse Notes: No concerns at this time

## 2024-01-20 DIAGNOSIS — N3281 Overactive bladder: Secondary | ICD-10-CM | POA: Diagnosis not present

## 2024-01-20 DIAGNOSIS — N3946 Mixed incontinence: Secondary | ICD-10-CM | POA: Diagnosis not present

## 2024-01-20 DIAGNOSIS — N958 Other specified menopausal and perimenopausal disorders: Secondary | ICD-10-CM | POA: Diagnosis not present

## 2024-01-26 DIAGNOSIS — M7989 Other specified soft tissue disorders: Secondary | ICD-10-CM | POA: Diagnosis not present

## 2024-01-26 DIAGNOSIS — I071 Rheumatic tricuspid insufficiency: Secondary | ICD-10-CM | POA: Diagnosis not present

## 2024-02-04 ENCOUNTER — Encounter: Payer: Medicare Other | Admitting: Family Medicine

## 2024-02-09 ENCOUNTER — Ambulatory Visit (INDEPENDENT_AMBULATORY_CARE_PROVIDER_SITE_OTHER): Payer: Medicare Other | Admitting: Family Medicine

## 2024-02-09 ENCOUNTER — Encounter: Payer: Self-pay | Admitting: Family Medicine

## 2024-02-09 VITALS — BP 125/71 | HR 78 | Temp 98.7°F | Ht 65.0 in | Wt 213.0 lb

## 2024-02-09 DIAGNOSIS — E034 Atrophy of thyroid (acquired): Secondary | ICD-10-CM | POA: Diagnosis not present

## 2024-02-09 DIAGNOSIS — Z1159 Encounter for screening for other viral diseases: Secondary | ICD-10-CM | POA: Diagnosis not present

## 2024-02-09 DIAGNOSIS — E782 Mixed hyperlipidemia: Secondary | ICD-10-CM

## 2024-02-09 DIAGNOSIS — Z6835 Body mass index (BMI) 35.0-35.9, adult: Secondary | ICD-10-CM

## 2024-02-09 DIAGNOSIS — I10 Essential (primary) hypertension: Secondary | ICD-10-CM

## 2024-02-09 DIAGNOSIS — L304 Erythema intertrigo: Secondary | ICD-10-CM

## 2024-02-09 DIAGNOSIS — Z0000 Encounter for general adult medical examination without abnormal findings: Secondary | ICD-10-CM

## 2024-02-09 DIAGNOSIS — G4762 Sleep related leg cramps: Secondary | ICD-10-CM

## 2024-02-09 DIAGNOSIS — M810 Age-related osteoporosis without current pathological fracture: Secondary | ICD-10-CM | POA: Diagnosis not present

## 2024-02-09 DIAGNOSIS — Z0001 Encounter for general adult medical examination with abnormal findings: Secondary | ICD-10-CM

## 2024-02-09 DIAGNOSIS — Z872 Personal history of diseases of the skin and subcutaneous tissue: Secondary | ICD-10-CM

## 2024-02-09 DIAGNOSIS — F5101 Primary insomnia: Secondary | ICD-10-CM

## 2024-02-09 LAB — BAYER DCA HB A1C WAIVED: HB A1C (BAYER DCA - WAIVED): 5.6 % (ref 4.8–5.6)

## 2024-02-09 LAB — LIPID PANEL

## 2024-02-09 MED ORDER — HYDROCHLOROTHIAZIDE 25 MG PO TABS: 25.0000 mg | ORAL_TABLET | Freq: Every day | ORAL | 3 refills | Status: DC

## 2024-02-09 MED ORDER — SIMVASTATIN 20 MG PO TABS: 20.0000 mg | ORAL_TABLET | Freq: Every day | ORAL | 3 refills | Status: DC

## 2024-02-09 MED ORDER — MIRTAZAPINE 15 MG PO TABS: 15.0000 mg | ORAL_TABLET | Freq: Every day | ORAL | 3 refills | Status: DC

## 2024-02-09 MED ORDER — NYSTATIN 100000 UNIT/GM EX CREA: 1.0000 | TOPICAL_CREAM | Freq: Two times a day (BID) | CUTANEOUS | 1 refills | Status: AC | PRN

## 2024-02-09 MED ORDER — LEVOTHYROXINE SODIUM 75 MCG PO TABS: 75.0000 ug | ORAL_TABLET | Freq: Every day | ORAL | 3 refills | Status: DC

## 2024-02-09 MED ORDER — ROPINIROLE HCL 0.25 MG PO TABS: 0.2500 mg | ORAL_TABLET | Freq: Every day | ORAL | 3 refills | Status: DC

## 2024-02-09 NOTE — Progress Notes (Signed)
 Leslie Hensley is a 78 y.o. female presents to office today for annual physical exam examination.    Concerns today include: 1. Irritation of skin She reports intermittent irritation of the skin under her breasts and along her panty line.  She had some nystatin  that was given to her by a friend and this really worked well to relieve the symptomology.  She is requesting this today.  Does not report any active rash right now.  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[employer], Substance use: none Health Maintenance Due  Topic Date Due   Hepatitis C Screening  Never done   Refills needed today: none  Immunization History  Administered Date(s) Administered   Td 06/24/2019   Tdap 06/24/2019   Zoster Recombinant(Shingrix) 01/04/2022, 03/11/2022   Past Medical History:  Diagnosis Date   Brain aneurysm    Hyperlipidemia    Hypertension    Thyroid  disease    Social History   Socioeconomic History   Marital status: Divorced    Spouse name: Not on file   Number of children: 3   Years of education: 10   Highest education level: 10th grade  Occupational History   Occupation: retired  Tobacco Use   Smoking status: Former    Current packs/day: 0.00    Types: Cigarettes    Quit date: 12/21/1999    Years since quitting: 24.1   Smokeless tobacco: Never  Vaping Use   Vaping status: Never Used  Substance and Sexual Activity   Alcohol use: Not Currently   Drug use: Not Currently   Sexual activity: Not Currently    Birth control/protection: Surgical  Other Topics Concern   Not on file  Social History Narrative   Not on file   Social Drivers of Health   Financial Resource Strain: Low Risk  (01/13/2024)   Overall Financial Resource Strain (CARDIA)    Difficulty of Paying Living Expenses: Not hard at all  Food Insecurity: No Food Insecurity (01/13/2024)   Hunger Vital Sign    Worried About Running Out of Food in the Last Year: Never true    Ran Out of Food in the Last Year: Never true   Transportation Needs: No Transportation Needs (01/13/2024)   PRAPARE - Administrator, Civil Service (Medical): No    Lack of Transportation (Non-Medical): No  Physical Activity: Insufficiently Active (01/13/2024)   Exercise Vital Sign    Days of Exercise per Week: 3 days    Minutes of Exercise per Session: 30 min  Stress: No Stress Concern Present (01/13/2024)   [employer] of Occupational Health - Occupational Stress Questionnaire    Feeling of Stress : Not at all  Social Connections: Moderately Isolated (01/13/2024)   Social Connection and Isolation Panel [NHANES]    Frequency of Communication with Friends and Family: More than three times a week    Frequency of Social Gatherings with Friends and Family: Three times a week    Attends Religious Services: More than 4 times per year    Active Member of Clubs or Organizations: No    Attends Banker Meetings: Never    Marital Status: Divorced  Catering manager Violence: Not At Risk (01/13/2024)   Humiliation, Afraid, Rape, and Kick questionnaire    Fear of Current or Ex-Partner: No    Emotionally Abused: No    Physically Abused: No    Sexually Abused: No   Past Surgical History:  Procedure Laterality Date   ABDOMINAL HYSTERECTOMY  BRAIN SURGERY     aneurysm   FOOT SURGERY Left    spur removed   KIDNEY STONE SURGERY     Family History  Problem Relation Age of Onset   Heart disease Mother    Heart disease Father    Stroke Father    Heart disease Sister    Heart disease Brother    Diabetes Brother    Heart disease Brother    Diabetes Brother    Stroke Brother    Cancer Brother     Current Outpatient Medications:    ascorbic acid  (VITAMIN C) 500 MG tablet, Take 1 tablet (500 mg total) by mouth daily., Disp: 90 tablet, Rfl: 3   aspirin  81 MG EC tablet, Take 1 tablet (81 mg total) by mouth daily. Swallow whole., Disp: 90 tablet, Rfl: 3   Cholecalciferol (VITAMIN D3) 125 MCG (5000 UT) CAPS,  Take 1 capsule (5,000 Units total) by mouth daily., Disp: 90 capsule, Rfl: 3   doxycycline  (VIBRAMYCIN ) 100 MG capsule, Take 1 capsule (100 mg total) by mouth 2 (two) times daily., Disp: 20 capsule, Rfl: 0   fluconazole  (DIFLUCAN ) 150 MG tablet, Take 1 tablet (150 mg total) by mouth daily., Disp: 1 tablet, Rfl: 0   hydrochlorothiazide  (HYDRODIURIL ) 25 MG tablet, TAKE 1 TABLET BY MOUTH DAILY. GENERIC EQUIVALENT FOR HYDRODIURIL , Disp: 90 tablet, Rfl: 0   levothyroxine  (SYNTHROID ) 75 MCG tablet, TAKE 1 TABLET BY MOUTH DAILY, Disp: 90 tablet, Rfl: 2   mirtazapine  (REMERON ) 15 MG tablet, TAKE 1 TABLET BY MOUTH AT BEDTIME, Disp: 90 tablet, Rfl: 0   Omega-3 Fatty Acids (FISH OIL ) 645 MG CAPS, Take 1 capsule by mouth daily., Disp: 90 capsule, Rfl: 3   rOPINIRole  (REQUIP ) 0.25 MG tablet, Take 1-3 tablets (0.25-0.75 mg total) by mouth at bedtime., Disp: 270 tablet, Rfl: 3   simvastatin  (ZOCOR ) 20 MG tablet, TAKE 1 TABLET BY MOUTH DAILY, Disp: 90 tablet, Rfl: 0   vitamin E  180 MG (400 UNITS) capsule, Take 1 capsule (400 Units total) by mouth daily., Disp: 90 capsule, Rfl: 3  Allergies  Allergen Reactions   Codeine Hives   Cyclobenzaprine Swelling   Diclofenac Sodium Rash     ROS: Review of Systems A comprehensive review of systems was negative except for: Gastrointestinal: positive for constipation Genitourinary: positive for urinary incontinence Integument/breast: positive for pruritus and rash    Physical exam BP 125/71   Pulse 78   Temp 98.7 F (37.1 C)   Ht 5\' 5"  (1.651 m)   Wt 213 lb (96.6 kg)   SpO2 92%   BMI 35.45 kg/m  General appearance: alert, cooperative, appears stated age, and no distress Head: Normocephalic, without obvious abnormality, atraumatic Eyes: negative findings: lids and lashes normal, conjunctivae and sclerae normal, corneas clear, and pupils equal, round, reactive to light and accomodation Ears: normal TM's and external ear canals both ears Nose: Nares normal.  Septum midline. Mucosa normal. No drainage or sinus tenderness. Throat: lips, mucosa, and tongue normal; teeth and gums normal Neck: no adenopathy, supple, symmetrical, trachea midline, and thyroid  not enlarged, symmetric, no tenderness/mass/nodules Back: symmetric, no curvature. ROM normal. No CVA tenderness. Lungs: clear to auscultation bilaterally Heart: regular rate and rhythm, S1, S2 normal, no murmur, click, rub or gallop Abdomen: soft, non-tender; bowel sounds normal; no masses,  no organomegaly Extremities:  trace edema. Warm well perfused Pulses: 2+ and symmetric Skin:  Multiple senile/ solar lentigo Lymph nodes: Cervical, supraclavicular, and axillary nodes normal. Neurologic: Grossly normal  02/09/2024    8:03 AM 01/13/2024   10:28 AM 10/20/2023    1:56 PM  Depression screen PHQ 2/9  Decreased Interest 0 0 0  Down, Depressed, Hopeless 0 0 0  PHQ - 2 Score 0 0 0  Altered sleeping 0  0  Tired, decreased energy 0  0  Change in appetite 0  0  Feeling bad or failure about yourself  0  0  Trouble concentrating 0  0  Moving slowly or fidgety/restless 0  0  Suicidal thoughts 0  0  PHQ-9 Score 0  0  Difficult doing work/chores Not difficult at all  Not difficult at all      02/09/2024    8:03 AM 10/20/2023    1:56 PM 10/01/2023   10:12 AM 07/21/2023    3:28 PM  GAD 7 : Generalized Anxiety Score  Nervous, Anxious, on Edge 0 0 0 0  Control/stop worrying 0 0 0 0  Worry too much - different things 0 0 0 0  Trouble relaxing 0 0 0 0  Restless 0 0 0 0  Easily annoyed or irritable 0 0 0 0  Afraid - awful might happen 0 0 0 0  Total GAD 7 Score 0 0 0 0  Anxiety Difficulty Not difficult at all Not difficult at all Not difficult at all Not difficult at all     Assessment/ Plan: Corine Dice here for annual physical exam.   Annual physical exam  Intertrigo - Plan: nystatin  cream (MYCOSTATIN )  Hypothyroidism due to acquired atrophy of thyroid  - Plan: TSH + free T4,  levothyroxine  (SYNTHROID ) 75 MCG tablet  Essential hypertension - Plan: CMP14+EGFR, hydrochlorothiazide  (HYDRODIURIL ) 25 MG tablet  H/O discoid lupus erythematosus - Plan: CBC  Mixed hyperlipidemia - Plan: CMP14+EGFR, Lipid Panel, simvastatin  (ZOCOR ) 20 MG tablet  Osteoporosis of forearm - Plan: VITAMIN D  25 Hydroxy (Vit-D Deficiency, Fractures), CMP14+EGFR  Morbid obesity (HCC) - Plan: CMP14+EGFR, Bayer DCA Hb A1c Waived  BMI 35.0-35.9,adult  Encounter for hepatitis C screening test for low risk patient - Plan: Hepatitis C Antibody  Primary insomnia - Plan: mirtazapine  (REMERON ) 15 MG tablet  Nocturnal leg cramps - Plan: rOPINIRole  (REQUIP ) 0.25 MG tablet  Fasting labs collected today.  I have sent in nystatin  cream for what sounds like intertrigo.  I given her information on how to prevent this and a handout was provided as well.  Sleep and mood are stable.  Not yet due for DEXA scan.  Check vitamin D , calcium level.  Blood pressure well-controlled with hydrochlorothiazide .  Edema is stable.  Hepatitis C screening collected today.  All meds have been renewed.  Counseled on healthy lifestyle choices, including diet (rich in fruits, vegetables and lean meats and low in salt and simple carbohydrates) and exercise (at least 30 minutes of moderate physical activity daily).  Patient to follow up 6 months for thyroid   Ermal Brzozowski M. Bonnell Butcher, DO

## 2024-02-10 ENCOUNTER — Encounter: Payer: Self-pay | Admitting: Family Medicine

## 2024-02-10 LAB — CMP14+EGFR
ALT: 14 [IU]/L (ref 0–32)
AST: 18 [IU]/L (ref 0–40)
Albumin: 4 g/dL (ref 3.8–4.8)
Alkaline Phosphatase: 78 [IU]/L (ref 44–121)
BUN/Creatinine Ratio: 16 (ref 12–28)
BUN: 11 mg/dL (ref 8–27)
Bilirubin Total: 0.5 mg/dL (ref 0.0–1.2)
CO2: 23 mmol/L (ref 20–29)
Calcium: 9.2 mg/dL (ref 8.7–10.3)
Chloride: 107 mmol/L — ABNORMAL HIGH (ref 96–106)
Creatinine, Ser: 0.68 mg/dL (ref 0.57–1.00)
Globulin, Total: 3.1 g/dL (ref 1.5–4.5)
Glucose: 98 mg/dL (ref 70–99)
Potassium: 3.8 mmol/L (ref 3.5–5.2)
Sodium: 144 mmol/L (ref 134–144)
Total Protein: 7.1 g/dL (ref 6.0–8.5)
eGFR: 90 mL/min/{1.73_m2} (ref >59)

## 2024-02-10 LAB — LIPID PANEL
Cholesterol, Total: 125 mg/dL (ref 100–199)
HDL: 40 mg/dL (ref >39)
LDL CALC COMMENT:: 3.1 ratio (ref 0.0–4.4)
LDL Chol Calc (NIH): 64 mg/dL (ref 0–99)
Triglycerides: 112 mg/dL (ref 0–149)
VLDL Cholesterol Cal: 21 mg/dL (ref 5–40)

## 2024-02-10 LAB — VITAMIN D 25 HYDROXY (VIT D DEFICIENCY, FRACTURES): Vit D, 25-Hydroxy: 52.8 ng/mL (ref 30.0–100.0)

## 2024-02-10 LAB — CBC
Hematocrit: 42.5 % (ref 34.0–46.6)
Hemoglobin: 13.8 g/dL (ref 11.1–15.9)
MCH: 30.8 pg (ref 26.6–33.0)
MCHC: 32.5 g/dL (ref 31.5–35.7)
MCV: 95 fL (ref 79–97)
Platelets: 253 10*3/uL (ref 150–450)
RBC: 4.48 x10E6/uL (ref 3.77–5.28)
RDW: 13.3 % (ref 11.7–15.4)
WBC: 4.8 10*3/uL (ref 3.4–10.8)

## 2024-02-10 LAB — TSH+FREE T4
Free T4: 1.13 ng/dL (ref 0.82–1.77)
TSH: 1.73 u[IU]/mL (ref 0.450–4.500)

## 2024-02-10 LAB — HEPATITIS C ANTIBODY

## 2024-02-16 ENCOUNTER — Ambulatory Visit (INDEPENDENT_AMBULATORY_CARE_PROVIDER_SITE_OTHER): Payer: Medicare Other | Admitting: Family Medicine

## 2024-02-16 ENCOUNTER — Encounter: Payer: Self-pay | Admitting: Family Medicine

## 2024-02-16 VITALS — BP 130/75 | HR 77 | Temp 98.2°F | Ht 65.0 in | Wt 215.8 lb

## 2024-02-16 DIAGNOSIS — R300 Dysuria: Secondary | ICD-10-CM | POA: Diagnosis not present

## 2024-02-16 DIAGNOSIS — B379 Candidiasis, unspecified: Secondary | ICD-10-CM

## 2024-02-16 DIAGNOSIS — N3000 Acute cystitis without hematuria: Secondary | ICD-10-CM | POA: Diagnosis not present

## 2024-02-16 LAB — MICROSCOPIC EXAMINATION
Renal Epithel, UA: NONE SEEN /[HPF]
WBC, UA: >30 /[HPF] — AB (ref 0–5)

## 2024-02-16 LAB — URINALYSIS, ROUTINE W REFLEX MICROSCOPIC
Bilirubin, UA: NEGATIVE
Glucose, UA: NEGATIVE
Ketones, UA: NEGATIVE
Nitrite, UA: NEGATIVE
Specific Gravity, UA: 1.015 (ref 1.005–1.030)
Urobilinogen, Ur: 0.2 mg/dL (ref 0.2–1.0)
pH, UA: 7 (ref 5.0–7.5)

## 2024-02-16 MED ORDER — FLUCONAZOLE 150 MG PO TABS: ORAL_TABLET | ORAL | 0 refills | Status: AC

## 2024-02-16 MED ORDER — CEPHALEXIN 500 MG PO CAPS: 500.0000 mg | ORAL_CAPSULE | Freq: Four times a day (QID) | ORAL | 0 refills | Status: AC

## 2024-02-16 NOTE — Progress Notes (Signed)
   Acute Office Visit  Subjective:     Patient ID: Leslie Hensley, female    DOB: September 26, 1946, 78 y.o.   MRN: [id number]  Chief Complaint  Patient presents with   Dysuria    Dysuria  This is a new problem. The current episode started in the past 7 days. The problem occurs every urination. The problem has been gradually worsening. The quality of the pain is described as aching. There has been no fever. Associated symptoms include frequency, hesitancy and urgency. Pertinent negatives include no chills, discharge, flank pain, hematuria, nausea, possible pregnancy, sweats or vomiting. She has tried nothing for the symptoms. Her past medical history is significant for recurrent UTIs.    Review of Systems  Constitutional:  Negative for chills.  Gastrointestinal:  Negative for nausea and vomiting.  Genitourinary:  Positive for dysuria, frequency, hesitancy and urgency. Negative for flank pain and hematuria.        Objective:    BP 130/75   Pulse 77   Temp 98.2 F (36.8 C) (Temporal)   Ht 5\' 5"  (1.651 m)   Wt 215 lb 12.8 oz (97.9 kg)   SpO2 98%   BMI 35.91 kg/m    Physical Exam Vitals and nursing note reviewed.  Constitutional:      General: She is not in acute distress.    Appearance: Normal appearance. She is not ill-appearing, toxic-appearing or diaphoretic.  Cardiovascular:     Rate and Rhythm: Normal rate and regular rhythm.     Heart sounds: Normal heart sounds. No murmur heard. Pulmonary:     Effort: Pulmonary effort is normal. No respiratory distress.     Breath sounds: Normal breath sounds. No wheezing, rhonchi or rales.  Abdominal:     General: Bowel sounds are normal. There is no distension.     Palpations: Abdomen is soft.     Tenderness: There is no abdominal tenderness. There is no right CVA tenderness, left CVA tenderness, guarding or rebound.  Skin:    General: Skin is warm and dry.  Neurological:     General: No focal deficit present.     Mental Status:  She is alert and oriented to person, place, and time.  Psychiatric:        Mood and Affect: Mood normal.        Behavior: Behavior normal.     Urine dipstick shows positive for RBC's, positive for protein, and positive for leukocytes.  Micro exam: >30 WBC's per HPF, 0-2 RBC's per HPF, and many+ bacteria. Yeast present.        Assessment & Plan:   Leslie Hensley was seen today for dysuria.  Diagnoses and all orders for this visit:  Acute cystitis without hematuria Keflex as below pending urine culture.  -     Urinalysis, Routine w reflex microscopic -     Urine Culture -     cephALEXin (KEFLEX) 500 MG capsule; Take 1 capsule (500 mg total) by mouth 4 (four) times daily for 7 days.  Yeast detected Diflucan as below.  -     fluconazole (DIFLUCAN) 150 MG tablet; Take once tablet by mouth. Repeat in 1 week.  Return to office for new or worsening symptoms, or if symptoms persist.   The patient indicates understanding of these issues and agrees with the plan.  Gabriel Earing, FNP

## 2024-02-18 LAB — URINE CULTURE

## 2024-02-20 DIAGNOSIS — N3281 Overactive bladder: Secondary | ICD-10-CM | POA: Diagnosis not present

## 2024-04-01 DIAGNOSIS — L930 Discoid lupus erythematosus: Secondary | ICD-10-CM | POA: Diagnosis not present

## 2024-04-02 ENCOUNTER — Other Ambulatory Visit: Payer: Self-pay | Admitting: Family Medicine

## 2024-04-02 DIAGNOSIS — E782 Mixed hyperlipidemia: Secondary | ICD-10-CM

## 2024-04-09 ENCOUNTER — Other Ambulatory Visit: Payer: Self-pay | Admitting: Family Medicine

## 2024-04-09 DIAGNOSIS — G4762 Sleep related leg cramps: Secondary | ICD-10-CM

## 2024-04-12 ENCOUNTER — Other Ambulatory Visit: Payer: Self-pay | Admitting: Family Medicine

## 2024-04-12 DIAGNOSIS — F5101 Primary insomnia: Secondary | ICD-10-CM

## 2024-04-12 DIAGNOSIS — I10 Essential (primary) hypertension: Secondary | ICD-10-CM

## 2024-07-01 DIAGNOSIS — K080 Exfoliation of teeth due to systemic causes: Secondary | ICD-10-CM | POA: Diagnosis not present

## 2024-07-11 ENCOUNTER — Other Ambulatory Visit: Payer: Self-pay | Admitting: Family Medicine

## 2024-07-11 DIAGNOSIS — F5101 Primary insomnia: Secondary | ICD-10-CM

## 2024-07-11 DIAGNOSIS — I10 Essential (primary) hypertension: Secondary | ICD-10-CM

## 2024-07-11 DIAGNOSIS — G4762 Sleep related leg cramps: Secondary | ICD-10-CM

## 2024-07-12 ENCOUNTER — Encounter: Payer: Self-pay | Admitting: Family Medicine

## 2024-07-12 NOTE — Telephone Encounter (Signed)
 [facility] NTBS in Aug for 6 mos FU RF sent to mail order pharmacy
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
## 2024-09-08 ENCOUNTER — Ambulatory Visit: Payer: Self-pay

## 2024-09-08 NOTE — Telephone Encounter (Signed)
 FYI Only or Action Required?: FYI only for provider.  Patient was last seen in primary care on 02/16/2024 by Joesph Annabella HERO, FNP.  Called Nurse Triage reporting Urinary Frequency.  Symptoms began a week ago.  Interventions attempted: Rest, hydration, or home remedies.  Symptoms are: unchanged.  Triage Disposition: See Physician Within 24 Hours  Patient/caregiver understands and will follow disposition?: Yes   Copied from CRM [id number]. Topic: Clinical - Red Word Triage >> Sep 08, 2024  2:19 PM Emylou G wrote: [facility] that prompted transfer to Nurse Triage: urine pressure and it hurts to go Reason for Disposition  Age > 50 years  Answer Assessment - Initial Assessment Questions Request SDV   1. SEVERITY: How bad is the pain?  (e.g., Scale 1-10; mild, moderate, or severe)     Burning 2. FREQUENCY: How many times have you had painful urination today?      Not increased 3. PATTERN: Is pain present every time you urinate or just sometimes?      Each void 4. ONSET: When did the painful urination start?      One week 5. FEVER: Do you have a fever? If Yes, ask: What is your temperature, how was it measured, and when did it start?     Denies  6. PAST UTI: Have you had a urine infection before? If Yes, ask: When was the last time? and What happened that time?      Yes-multiple 7. CAUSE: What do you think is causing the painful urination?  (e.g., UTI, scratch, Herpes sore)     uti 8. OTHER SYMPTOMS: Do you have any other symptoms? (e.g., blood in urine, flank pain, genital sores, urgency, vaginal discharge)      Urinary urgency  Protocols used: Urination Pain - Female-A-AH

## 2024-09-08 NOTE — Telephone Encounter (Signed)
 Patient is scheduled to be seen for this tomorrow.

## 2024-09-09 ENCOUNTER — Ambulatory Visit (INDEPENDENT_AMBULATORY_CARE_PROVIDER_SITE_OTHER): Admitting: Nurse Practitioner

## 2024-09-09 ENCOUNTER — Encounter: Payer: Self-pay | Admitting: Nurse Practitioner

## 2024-09-09 ENCOUNTER — Ambulatory Visit: Payer: Self-pay | Admitting: Nurse Practitioner

## 2024-09-09 VITALS — BP 154/73 | HR 75 | Temp 97.4°F | Ht 65.0 in | Wt 217.0 lb

## 2024-09-09 DIAGNOSIS — R300 Dysuria: Secondary | ICD-10-CM

## 2024-09-09 DIAGNOSIS — N3000 Acute cystitis without hematuria: Secondary | ICD-10-CM | POA: Diagnosis not present

## 2024-09-09 LAB — URINALYSIS, ROUTINE W REFLEX MICROSCOPIC
Bilirubin, UA: NEGATIVE
Glucose, UA: NEGATIVE
Ketones, UA: NEGATIVE
Nitrite, UA: NEGATIVE
Protein,UA: NEGATIVE
RBC, UA: NEGATIVE
Specific Gravity, UA: 1.01 (ref 1.005–1.030)
Urobilinogen, Ur: 0.2 mg/dL (ref 0.2–1.0)
pH, UA: 7 (ref 5.0–7.5)

## 2024-09-09 LAB — MICROSCOPIC EXAMINATION
Renal Epithel, UA: NONE SEEN /HPF
Yeast, UA: NONE SEEN

## 2024-09-09 MED ORDER — SULFAMETHOXAZOLE-TRIMETHOPRIM 800-160 MG PO TABS: 1.0000 | ORAL_TABLET | Freq: Two times a day (BID) | ORAL | 0 refills | Status: AC
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
## 2024-09-09 NOTE — Patient Instructions (Signed)
 Take medication as prescribe Cotton underwear Take shower not bath Cranberry juice, yogurt Force fluids AZO over the counter X2 days Culture pending RTO prn

## 2024-09-13 DIAGNOSIS — Z09 Encounter for follow-up examination after completed treatment for conditions other than malignant neoplasm: Secondary | ICD-10-CM | POA: Diagnosis not present

## 2024-09-13 DIAGNOSIS — Z8679 Personal history of other diseases of the circulatory system: Secondary | ICD-10-CM | POA: Diagnosis not present

## 2024-09-13 DIAGNOSIS — I671 Cerebral aneurysm, nonruptured: Secondary | ICD-10-CM | POA: Diagnosis not present

## 2024-09-13 DIAGNOSIS — Z9889 Other specified postprocedural states: Secondary | ICD-10-CM | POA: Diagnosis not present

## 2024-09-14 LAB — URINE CULTURE

## 2024-09-16 DIAGNOSIS — H02834 Dermatochalasis of left upper eyelid: Secondary | ICD-10-CM | POA: Diagnosis not present

## 2024-09-16 DIAGNOSIS — H02413 Mechanical ptosis of bilateral eyelids: Secondary | ICD-10-CM | POA: Diagnosis not present

## 2024-09-16 DIAGNOSIS — H02831 Dermatochalasis of right upper eyelid: Secondary | ICD-10-CM | POA: Diagnosis not present

## 2024-09-16 DIAGNOSIS — H0279 Other degenerative disorders of eyelid and periocular area: Secondary | ICD-10-CM | POA: Diagnosis not present

## 2024-09-29 DIAGNOSIS — H53483 Generalized contraction of visual field, bilateral: Secondary | ICD-10-CM | POA: Diagnosis not present

## 2024-10-01 ENCOUNTER — Other Ambulatory Visit: Payer: Self-pay | Admitting: Family Medicine

## 2024-10-01 DIAGNOSIS — E034 Atrophy of thyroid (acquired): Secondary | ICD-10-CM

## 2024-10-01 DIAGNOSIS — F5101 Primary insomnia: Secondary | ICD-10-CM

## 2024-10-01 DIAGNOSIS — I10 Essential (primary) hypertension: Secondary | ICD-10-CM

## 2024-10-01 DIAGNOSIS — G4762 Sleep related leg cramps: Secondary | ICD-10-CM

## 2024-10-21 DIAGNOSIS — L03031 Cellulitis of right toe: Secondary | ICD-10-CM | POA: Diagnosis not present

## 2024-10-21 DIAGNOSIS — M79674 Pain in right toe(s): Secondary | ICD-10-CM | POA: Diagnosis not present

## 2024-11-01 DIAGNOSIS — H40033 Anatomical narrow angle, bilateral: Secondary | ICD-10-CM | POA: Diagnosis not present

## 2024-11-01 DIAGNOSIS — H2513 Age-related nuclear cataract, bilateral: Secondary | ICD-10-CM | POA: Diagnosis not present

## 2024-11-09 DIAGNOSIS — L03032 Cellulitis of left toe: Secondary | ICD-10-CM | POA: Diagnosis not present

## 2024-11-09 DIAGNOSIS — M79675 Pain in left toe(s): Secondary | ICD-10-CM | POA: Diagnosis not present

## 2024-11-23 DIAGNOSIS — M79675 Pain in left toe(s): Secondary | ICD-10-CM | POA: Diagnosis not present

## 2024-11-23 DIAGNOSIS — L03032 Cellulitis of left toe: Secondary | ICD-10-CM | POA: Diagnosis not present

## 2024-12-03 ENCOUNTER — Other Ambulatory Visit: Payer: Self-pay | Admitting: Family Medicine

## 2024-12-03 DIAGNOSIS — E782 Mixed hyperlipidemia: Secondary | ICD-10-CM

## 2024-12-09 DIAGNOSIS — Z1231 Encounter for screening mammogram for malignant neoplasm of breast: Secondary | ICD-10-CM | POA: Diagnosis not present

## 2024-12-09 DIAGNOSIS — R92323 Mammographic fibroglandular density, bilateral breasts: Secondary | ICD-10-CM | POA: Diagnosis not present

## 2024-12-09 LAB — HM MAMMOGRAPHY

## 2024-12-17 ENCOUNTER — Encounter: Payer: Self-pay | Admitting: Family Medicine

## 2024-12-21 ENCOUNTER — Other Ambulatory Visit: Payer: Self-pay | Admitting: Family Medicine

## 2024-12-21 DIAGNOSIS — G4762 Sleep related leg cramps: Secondary | ICD-10-CM

## 2024-12-21 DIAGNOSIS — I10 Essential (primary) hypertension: Secondary | ICD-10-CM

## 2024-12-21 DIAGNOSIS — F5101 Primary insomnia: Secondary | ICD-10-CM

## 2025-01-13 ENCOUNTER — Ambulatory Visit

## 2025-01-13 ENCOUNTER — Ambulatory Visit (INDEPENDENT_AMBULATORY_CARE_PROVIDER_SITE_OTHER): Payer: Medicare Other

## 2025-01-13 VITALS — BP 131/75 | HR 80 | Temp 97.7°F | Ht 65.0 in | Wt 217.0 lb

## 2025-01-13 DIAGNOSIS — Z13820 Encounter for screening for osteoporosis: Secondary | ICD-10-CM | POA: Diagnosis not present

## 2025-01-13 DIAGNOSIS — Z0000 Encounter for general adult medical examination without abnormal findings: Secondary | ICD-10-CM

## 2025-01-13 NOTE — Patient Instructions (Signed)
 Leslie Hensley,  Thank you for taking the time for your Medicare Wellness Visit. I appreciate your continued commitment to your health goals. Please review the care plan we discussed, and feel free to reach out if I can assist you further.  Please note that Annual Wellness Visits do not include a physical exam. Some assessments may be limited, especially if the visit was conducted virtually. If needed, we may recommend an in-person follow-up with your provider.  Ongoing Care Seeing your primary care provider every 3 to 6 months helps us  monitor your health and provide consistent, personalized care.   Referrals If a referral was made during today's visit and you haven't received any updates within two weeks, please contact the referred provider directly to check on the status.  Recommended Screenings:  Health Maintenance  Topic Date Due   COVID-19 Vaccine (1) Never done   Flu Shot  Never done   Osteoporosis screening with Bone Density Scan  12/02/2024   Medicare Annual Wellness Visit  01/12/2025   Pneumococcal Vaccine for age over 23 (1 of 1 - PCV) 02/08/2025*   Breast Cancer Screening  12/09/2025   DTaP/Tdap/Td vaccine (3 - Td or Tdap) 06/23/2029   Colon Cancer Screening  01/17/2033   Hepatitis C Screening  Completed   Zoster (Shingles) Vaccine  Completed   Meningitis B Vaccine  Aged Out  *Topic was postponed. The date shown is not the original due date.       01/13/2025    9:31 AM  Advanced Directives  Does Patient Have a Medical Advance Directive? No  Would patient like information on creating a medical advance directive? No - Patient declined    Vision: Annual vision screenings are recommended for early detection of glaucoma, cataracts, and diabetic retinopathy. These exams can also reveal signs of chronic conditions such as diabetes and high blood pressure.  Dental: Annual dental screenings help detect early signs of oral cancer, gum disease, and other conditions linked to  overall health, including heart disease and diabetes.  Please see the attached documents for additional preventive care recommendations.  There are several Eye Doctors in your area. Here are a few that usually accept all insurance types: Please let us  know if you require a referral for an eye exam appointment. Thank you!  [facility]) [address] [place], [place] [address] Phone: [phone]  [facility] [address] [place], [place] [address] Phone: [phone]  [facility] [address] [place], [place] [address] Phone: [phone]  [facility] [address] [place], [place] [address] Phone: [phone]  [facility] [address] [place], [place] [address] Phone: [phone]  [facility] [address] [place], [place] [address] Phone: [phone]

## 2025-01-13 NOTE — Progress Notes (Addendum)
 "  Chief Complaint  Patient presents with   Medicare Wellness     Subjective:   Leslie Hensley is a 79 y.o. female who presents for a Medicare Annual Wellness Visit.  Visit info / Clinical Intake: Medicare Wellness Visit Type:: Subsequent Annual Wellness Visit Persons participating in visit and providing information:: patient Medicare Wellness Visit Mode:: In-person (required for WTM) Interpreter Needed?: No Pre-visit prep was completed: yes AWV questionnaire completed by patient prior to visit?: no Living arrangements:: (!) lives alone (with sister) Typical amount of pain: none Does pain affect daily life?: no Are you currently prescribed opioids?: no  Dietary Habits and Nutritional Risks How many meals a day?: 3 Eats fruit and vegetables daily?: yes Most meals are obtained by: having others provide food In the last 2 weeks, have you had any of the following?: none Diabetic:: no  Functional Status Activities of Daily Living (to include ambulation/medication): Independent Ambulation: Independent Medication Administration: Independent Home Management (perform basic housework or laundry): Independent Manage your own finances?: yes Primary transportation is: driving Concerns about vision?: no *vision screening is required for WTM* (last ov w/Dr. Jama) Concerns about hearing?: no  Fall Screening Falls in the past year?: 0 Number of falls in past year: 0 Was there an injury with Fall?: 0 Fall Risk Category Calculator: 0 Patient Fall Risk Level: Low Fall Risk  Fall Risk Patient at Risk for Falls Due to: No Fall Risks Fall risk Follow up: Falls evaluation completed; Education provided  Home and Transportation Safety: All rugs have non-skid backing?: yes All stairs or steps have railings?: yes Grab bars in the bathtub or shower?: yes Have non-skid surface in bathtub or shower?: yes Good home lighting?: yes Regular seat belt use?: yes Hospital stays in the last year::  no  Cognitive Assessment Difficulty concentrating, remembering, or making decisions? : yes Will 6CIT or Mini Cog be Completed: yes What year is it?: 0 points What month is it?: 0 points About what time is it?: 0 points Count backwards from 20 to 1: 0 points Say the months of the year in reverse: 0 points Repeat the address phrase from earlier: 0 points 6 CIT Score: 0 points  Advance Directives (For Healthcare) Does Patient Have a Medical Advance Directive?: No Would patient like information on creating a medical advance directive?: No - Patient declined  Reviewed/Updated  Reviewed/Updated: Reviewed All (Medical, Surgical, Family, Medications, Allergies, Care Teams, Patient Goals); Medical History; Surgical History; Family History; Medications; Allergies; Care Teams; Patient Goals    Allergies (verified) Codeine, Cyclobenzaprine, and Diclofenac sodium   Current Medications (verified) Outpatient Encounter Medications as of 01/13/2025  Medication Sig   ascorbic acid  (VITAMIN C) 500 MG tablet Take 1 tablet (500 mg total) by mouth daily.   aspirin  81 MG EC tablet Take 1 tablet (81 mg total) by mouth daily. Swallow whole.   Cholecalciferol (VITAMIN D3) 125 MCG (5000 UT) CAPS Take 1 capsule (5,000 Units total) by mouth daily.   estradiol (ESTRACE) 0.1 MG/GM vaginal cream Place 1 Applicatorful vaginally 3 (three) times a week.   fluconazole  (DIFLUCAN ) 150 MG tablet Take once tablet by mouth. Repeat in 1 week.   hydrochlorothiazide  (HYDRODIURIL ) 25 MG tablet Take 1 tablet (25 mg total) by mouth daily. Equivalent for Hydrodiuril  **NEEDS TO BE SEEN BEFORE NEXT REFILL**   levothyroxine  (SYNTHROID ) 75 MCG tablet TAKE 1 TABLET BY MOUTH DAILY   mirtazapine  (REMERON ) 15 MG tablet Take 1 tablet (15 mg total) by mouth at bedtime. **NEEDS TO  BE SEEN BEFORE NEXT REFILL**   nystatin  cream (MYCOSTATIN ) Apply 1 Application topically 2 (two) times daily as needed (fungal rash under breast for 10-14 days).    Omega-3 Fatty Acids (FISH OIL ) 645 MG CAPS Take 1 capsule by mouth daily.   rOPINIRole  (REQUIP ) 0.25 MG tablet Take 1-3 tablets (0.25-0.75 mg total) by mouth at bedtime. Equivalent to Requip  **NEEDS TO BE SEEN BEFORE NEXT REFILL**   simvastatin  (ZOCOR ) 20 MG tablet TAKE 1 TABLET BY MOUTH DAILY   sulfamethoxazole -trimethoprim  (BACTRIM  DS) 800-160 MG tablet Take 1 tablet by mouth 2 (two) times daily.   Vibegron (GEMTESA) 75 MG TABS Take 75 mg by mouth daily.   vitamin E  180 MG (400 UNITS) capsule Take 1 capsule (400 Units total) by mouth daily.   No facility-administered encounter medications on file as of 01/13/2025.    History: Past Medical History:  Diagnosis Date   Brain aneurysm    Hyperlipidemia    Hypertension    Thyroid  disease    Past Surgical History:  Procedure Laterality Date   ABDOMINAL HYSTERECTOMY     BRAIN SURGERY     aneurysm   FOOT SURGERY Left    spur removed   KIDNEY STONE SURGERY     Family History  Problem Relation Age of Onset   Heart disease Mother    Heart disease Father    Stroke Father    Heart disease Sister    Heart disease Brother    Diabetes Brother    Heart disease Brother    Diabetes Brother    Stroke Brother    Cancer Brother    Social History   Occupational History   Occupation: retired  Tobacco Use   Smoking status: Former    Current packs/day: 0.00    Types: Cigarettes    Quit date: 12/21/1999    Years since quitting: 25.0   Smokeless tobacco: Never  Vaping Use   Vaping status: Never Used  Substance and Sexual Activity   Alcohol use: Not Currently   Drug use: Not Currently   Sexual activity: Not Currently    Birth control/protection: Surgical   Tobacco Counseling Counseling given: Yes  SDOH Screenings   Food Insecurity: No Food Insecurity (01/13/2025)  Housing: Unknown (01/13/2025)  Transportation Needs: No Transportation Needs (01/13/2025)  Utilities: Not At Risk (01/13/2025)  Alcohol Screen: Low Risk (01/13/2024)   Depression (PHQ2-9): Low Risk (01/13/2025)  Financial Resource Strain: Low Risk (01/13/2024)  Physical Activity: Insufficiently Active (01/13/2025)  Social Connections: Moderately Isolated (01/13/2025)  Stress: No Stress Concern Present (01/13/2025)  Tobacco Use: Medium Risk (01/13/2025)  Health Literacy: Adequate Health Literacy (01/13/2025)   See flowsheets for full screening details  Depression Screen PHQ 2 & 9 Depression Scale- Over the past 2 weeks, how often have you been bothered by any of the following problems? Little interest or pleasure in doing things: 0 Feeling down, depressed, or hopeless (PHQ Adolescent also includes...irritable): 0 PHQ-2 Total Score: 0 Trouble falling or staying asleep, or sleeping too much: 0 Feeling tired or having little energy: 0 Poor appetite or overeating (PHQ Adolescent also includes...weight loss): 0 Feeling bad about yourself - or that you are a failure or have let yourself or your family down: 0 Trouble concentrating on things, such as reading the newspaper or watching television (PHQ Adolescent also includes...like school work): 0 Moving or speaking so slowly that other people could have noticed. Or the opposite - being so fidgety or restless that you have been moving around a  lot more than usual: 0 Thoughts that you would be better off dead, or of hurting yourself in some way: 0 PHQ-9 Total Score: 0 If you checked off any problems, how difficult have these problems made it for you to do your work, take care of things at home, or get along with other people?: Not difficult at all     Goals Addressed             This Visit's Progress    Remain active and independent   On track            Objective:    Today's Vitals   01/13/25 0928  BP: 131/75  Pulse: 80  Temp: 97.7 F (36.5 C)  TempSrc: Oral  Weight: 217 lb (98.4 kg)  Height: 5' 5 (1.651 m)   Body mass index is 36.11 kg/m.  Hearing/Vision screen No results  found. Immunizations and Health Maintenance Health Maintenance  Topic Date Due   Bone Density Scan  12/02/2024   Pneumococcal Vaccine: 50+ Years (1 of 1 - PCV) 02/08/2025 (Originally 07/07/1997)   Influenza Vaccine  03/29/2025 (Originally 07/30/2024)   COVID-19 Vaccine (1) 01/13/2026 (Originally 01/08/1947)   Mammogram  12/09/2025   Medicare Annual Wellness (AWV)  01/13/2026   DTaP/Tdap/Td (3 - Td or Tdap) 06/23/2029   Colonoscopy  01/17/2033   Hepatitis C Screening  Completed   Zoster Vaccines- Shingrix  Completed   Meningococcal B Vaccine  Aged Out        Assessment/Plan:  This is a routine wellness examination for [place].  Patient Care Team: Jolinda Norene HERO, DO as PCP - General (Family Medicine)  I have personally reviewed and noted the following in the patients chart:   Medical and social history Use of alcohol, tobacco or illicit drugs  Current medications and supplements including opioid prescriptions. Functional ability and status Nutritional status Physical activity Advanced directives List of other physicians Hospitalizations, surgeries, and ER visits in previous 12 months Vitals Screenings to include cognitive, depression, and falls Referrals and appointments  Orders Placed This Encounter  Procedures   DG [facility] DEXA    Standing Status:   Future    Expiration Date:   01/13/2026    Reason for Exam (SYMPTOM  OR DIAGNOSIS REQUIRED):   bone density   In addition, I have reviewed and discussed with patient certain preventive protocols, quality metrics, and best practice recommendations. A written personalized care plan for preventive services as well as general preventive health recommendations were provided to patient.   Ozie Ned, CMA   01/13/2025   Return in 1 year (on 01/13/2026).  After Visit Summary: (MyChart) Due to this being a telephonic visit, the after visit summary with patients personalized plan was offered to patient via MyChart   Nurse Notes: HM  Addressed: DEXA ordered  "

## 2025-01-13 NOTE — Addendum Note (Signed)
 Addended by: DEBBY SIMPER on: 01/13/2025 10:10 AM   Modules accepted: Level of Service

## 2025-01-17 ENCOUNTER — Other Ambulatory Visit: Payer: Self-pay | Admitting: Family Medicine

## 2025-01-17 DIAGNOSIS — F5101 Primary insomnia: Secondary | ICD-10-CM

## 2025-01-17 DIAGNOSIS — I10 Essential (primary) hypertension: Secondary | ICD-10-CM

## 2025-01-17 DIAGNOSIS — G4762 Sleep related leg cramps: Secondary | ICD-10-CM

## 2025-01-17 MED ORDER — ROPINIROLE HCL 0.25 MG PO TABS: 0.2500 mg | ORAL_TABLET | Freq: Every day | ORAL | 0 refills | Status: AC

## 2025-01-17 MED ORDER — HYDROCHLOROTHIAZIDE 25 MG PO TABS: 25.0000 mg | ORAL_TABLET | Freq: Every day | ORAL | 0 refills | Status: AC

## 2025-01-17 MED ORDER — MIRTAZAPINE 15 MG PO TABS: 15.0000 mg | ORAL_TABLET | Freq: Every day | ORAL | 0 refills | Status: AC

## 2025-01-17 NOTE — Telephone Encounter (Signed)
 I called pt & made her an appt w/[facility] on 01-15-2025 for med refill.

## 2025-01-17 NOTE — Addendum Note (Signed)
 Addended by: INA RAMP D on: 01/17/2025 02:40 PM   Modules accepted: Orders

## 2025-01-17 NOTE — Telephone Encounter (Signed)
 [facility] pt NTBS 30-d given 12/21/24

## 2025-01-20 ENCOUNTER — Ambulatory Visit (INDEPENDENT_AMBULATORY_CARE_PROVIDER_SITE_OTHER)

## 2025-01-20 DIAGNOSIS — Z13820 Encounter for screening for osteoporosis: Secondary | ICD-10-CM

## 2025-01-20 DIAGNOSIS — Z780 Asymptomatic menopausal state: Secondary | ICD-10-CM

## 2025-01-21 ENCOUNTER — Ambulatory Visit: Payer: Self-pay | Admitting: Family Medicine

## 2025-01-21 DIAGNOSIS — M810 Age-related osteoporosis without current pathological fracture: Secondary | ICD-10-CM

## 2025-01-22 MED ORDER — DENOSUMAB 60 MG/ML ~~LOC~~ SOSY: 60.0000 mg | PREFILLED_SYRINGE | Freq: Once | SUBCUTANEOUS | Status: AC

## 2025-01-25 ENCOUNTER — Ambulatory Visit: Admitting: Family Medicine

## 2025-01-26 ENCOUNTER — Telehealth: Payer: Self-pay

## 2025-01-26 DIAGNOSIS — M810 Age-related osteoporosis without current pathological fracture: Secondary | ICD-10-CM

## 2025-01-26 MED ORDER — DENOSUMAB 60 MG/ML ~~LOC~~ SOSY: 60.0000 mg | PREFILLED_SYRINGE | Freq: Once | SUBCUTANEOUS | Status: AC

## 2025-01-26 NOTE — Telephone Encounter (Signed)
 [employer]  sent for benefit verification please see encounter for [employer] 

## 2025-01-26 NOTE — Telephone Encounter (Signed)
 [employer]  sent for benefit verification New start.

## 2025-01-31 ENCOUNTER — Telehealth: Payer: Self-pay

## 2025-01-31 NOTE — Telephone Encounter (Signed)
 [employer]ia  VOB initiated via [email or web address]  Next Prolia  inj DUE: NEW START

## 2025-02-22 ENCOUNTER — Ambulatory Visit: Admitting: Family Medicine

## 2026-01-16 ENCOUNTER — Ambulatory Visit

## 5957-08-30 DEATH — deceased
